# Patient Record
Sex: Female | Born: 2000
Health system: Southern US, Community
[De-identification: ages and names within clinical notes are randomized; demographics above are authoritative.]

## PROBLEM LIST (undated history)

## (undated) ENCOUNTER — Emergency Department (HOSPITAL_BASED_OUTPATIENT_CLINIC_OR_DEPARTMENT_OTHER): Admission: EM | Payer: Medicaid Other

## (undated) DIAGNOSIS — T7840XA Allergy, unspecified, initial encounter: Secondary | ICD-10-CM

## (undated) DIAGNOSIS — J45909 Unspecified asthma, uncomplicated: Secondary | ICD-10-CM

## (undated) DIAGNOSIS — J302 Other seasonal allergic rhinitis: Secondary | ICD-10-CM

## (undated) DIAGNOSIS — K219 Gastro-esophageal reflux disease without esophagitis: Secondary | ICD-10-CM

## (undated) DIAGNOSIS — L309 Dermatitis, unspecified: Secondary | ICD-10-CM

## (undated) DIAGNOSIS — E669 Obesity, unspecified: Secondary | ICD-10-CM

## (undated) DIAGNOSIS — K8020 Calculus of gallbladder without cholecystitis without obstruction: Secondary | ICD-10-CM

## (undated) DIAGNOSIS — F419 Anxiety disorder, unspecified: Secondary | ICD-10-CM

## (undated) DIAGNOSIS — R519 Headache, unspecified: Secondary | ICD-10-CM

## (undated) DIAGNOSIS — R51 Headache: Secondary | ICD-10-CM

## (undated) DIAGNOSIS — F959 Tic disorder, unspecified: Secondary | ICD-10-CM

## (undated) HISTORY — DX: Anxiety disorder, unspecified: F41.9

## (undated) HISTORY — PX: BILATERAL CARPAL TUNNEL RELEASE: SHX6508

## (undated) HISTORY — DX: Gastro-esophageal reflux disease without esophagitis: K21.9

## (undated) HISTORY — PX: UPPER GASTROINTESTINAL ENDOSCOPY: SHX188

## (undated) HISTORY — DX: Tic disorder, unspecified: F95.9

## (undated) HISTORY — DX: Obesity, unspecified: E66.9

---
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
## 2022-01-02 ENCOUNTER — Other Ambulatory Visit (HOSPITAL_COMMUNITY): Payer: Self-pay

## 2022-01-06 DIAGNOSIS — F331 Major depressive disorder, recurrent, moderate: Secondary | ICD-10-CM | POA: Diagnosis not present

## 2022-01-12 ENCOUNTER — Other Ambulatory Visit (HOSPITAL_COMMUNITY): Payer: Self-pay

## 2022-01-20 DIAGNOSIS — F331 Major depressive disorder, recurrent, moderate: Secondary | ICD-10-CM | POA: Diagnosis not present

## 2022-01-26 ENCOUNTER — Other Ambulatory Visit: Payer: Self-pay | Admitting: Family Medicine

## 2022-01-26 ENCOUNTER — Other Ambulatory Visit (HOSPITAL_COMMUNITY): Payer: Self-pay

## 2022-01-26 DIAGNOSIS — J4540 Moderate persistent asthma, uncomplicated: Secondary | ICD-10-CM

## 2022-01-26 DIAGNOSIS — J4530 Mild persistent asthma, uncomplicated: Secondary | ICD-10-CM

## 2022-01-26 MED ORDER — ALBUTEROL SULFATE HFA 108 (90 BASE) MCG/ACT IN AERS
2.0000 | INHALATION_SPRAY | Freq: Four times a day (QID) | RESPIRATORY_TRACT | 2 refills | Status: DC | PRN
  Filled 2022-01-26: qty 18, 25d supply, fill #0
  Filled 2022-06-06: qty 18, 25d supply, fill #1
  Filled 2022-07-25: qty 18, 25d supply, fill #2

## 2022-01-27 DIAGNOSIS — F331 Major depressive disorder, recurrent, moderate: Secondary | ICD-10-CM | POA: Diagnosis not present

## 2022-01-30 ENCOUNTER — Ambulatory Visit (INDEPENDENT_AMBULATORY_CARE_PROVIDER_SITE_OTHER): Payer: Medicaid Other | Admitting: Psychiatry

## 2022-01-30 ENCOUNTER — Other Ambulatory Visit (HOSPITAL_COMMUNITY): Payer: Self-pay

## 2022-01-30 ENCOUNTER — Encounter (HOSPITAL_COMMUNITY): Payer: Self-pay | Admitting: Psychiatry

## 2022-01-30 ENCOUNTER — Other Ambulatory Visit: Payer: Self-pay

## 2022-01-30 DIAGNOSIS — F315 Bipolar disorder, current episode depressed, severe, with psychotic features: Secondary | ICD-10-CM | POA: Diagnosis not present

## 2022-01-30 DIAGNOSIS — F411 Generalized anxiety disorder: Secondary | ICD-10-CM | POA: Diagnosis not present

## 2022-01-30 MED ORDER — TRAZODONE HCL 50 MG PO TABS
50.0000 mg | ORAL_TABLET | Freq: Every day | ORAL | 3 refills | Status: DC
  Filled 2022-01-30: qty 30, 30d supply, fill #0
  Filled 2022-04-17: qty 30, 30d supply, fill #1
  Filled 2022-05-25 – 2022-05-26 (×2): qty 30, 30d supply, fill #2

## 2022-01-30 MED ORDER — ESCITALOPRAM OXALATE 20 MG PO TABS
20.0000 mg | ORAL_TABLET | Freq: Every day | ORAL | 3 refills | Status: DC
  Filled 2022-01-30: qty 30, 30d supply, fill #0
  Filled 2022-02-27: qty 30, 30d supply, fill #1
  Filled 2022-04-10: qty 30, 30d supply, fill #2

## 2022-01-30 MED ORDER — HYDROXYZINE HCL 10 MG PO TABS
10.0000 mg | ORAL_TABLET | Freq: Three times a day (TID) | ORAL | 3 refills | Status: DC | PRN
  Filled 2022-01-30: qty 90, 30d supply, fill #0

## 2022-01-30 MED ORDER — LYBALVI 10-10 MG PO TABS
10.0000 mg | ORAL_TABLET | Freq: Every evening | ORAL | 3 refills | Status: DC
  Filled 2022-01-30: qty 30, 30d supply, fill #0
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
## 2022-02-07 ENCOUNTER — Other Ambulatory Visit (HOSPITAL_COMMUNITY): Payer: Self-pay

## 2022-02-11 DIAGNOSIS — R059 Cough, unspecified: Secondary | ICD-10-CM | POA: Diagnosis not present

## 2022-02-11 DIAGNOSIS — H6502 Acute serous otitis media, left ear: Secondary | ICD-10-CM | POA: Diagnosis not present

## 2022-02-11 DIAGNOSIS — R519 Headache, unspecified: Secondary | ICD-10-CM | POA: Diagnosis not present

## 2022-02-11 DIAGNOSIS — Z20822 Contact with and (suspected) exposure to covid-19: Secondary | ICD-10-CM | POA: Diagnosis not present

## 2022-02-11 DIAGNOSIS — R52 Pain, unspecified: Secondary | ICD-10-CM | POA: Diagnosis not present

## 2022-02-17 DIAGNOSIS — F331 Major depressive disorder, recurrent, moderate: Secondary | ICD-10-CM | POA: Diagnosis not present

## 2022-02-27 ENCOUNTER — Other Ambulatory Visit (HOSPITAL_COMMUNITY): Payer: Self-pay

## 2022-02-28 ENCOUNTER — Other Ambulatory Visit (HOSPITAL_COMMUNITY): Payer: Self-pay

## 2022-03-01 ENCOUNTER — Other Ambulatory Visit (HOSPITAL_COMMUNITY): Payer: Self-pay

## 2022-03-02 ENCOUNTER — Telehealth: Payer: Self-pay

## 2022-03-02 ENCOUNTER — Other Ambulatory Visit (HOSPITAL_COMMUNITY): Payer: Self-pay

## 2022-03-02 MED ORDER — TRIAMCINOLONE ACETONIDE 0.1 % EX CREA
1.0000 "application " | TOPICAL_CREAM | Freq: Two times a day (BID) | CUTANEOUS | 0 refills | Status: DC
  Filled 2022-03-02: qty 454, 28d supply, fill #0

## 2022-03-02 NOTE — Addendum Note (Signed)
Addended by: Renard Hamper on: 03/02/2022 12:58 PM ? ? Modules accepted: Orders ? ?

## 2022-03-02 NOTE — Telephone Encounter (Signed)
Received 2nd request for triamcinolone cream. Please send Rx if appropriate to [facility]. Sunday Spillers, CMA ? ?

## 2022-03-03 DIAGNOSIS — F331 Major depressive disorder, recurrent, moderate: Secondary | ICD-10-CM | POA: Diagnosis not present

## 2022-03-17 DIAGNOSIS — F331 Major depressive disorder, recurrent, moderate: Secondary | ICD-10-CM | POA: Diagnosis not present

## 2022-03-22 NOTE — Progress Notes (Signed)
? ? ?03/23/2022 ?MLISSA TAMAYO ?[id number] ?June 07, 2001 ? ? ?ASSESSMENT AND PLAN:  ? ?Nausea and vomiting, unspecified vomiting type ?Will refill Zofran, give Phenergan as needed. ?Would not do Reglan in this patient with Tourette's and movement disorder. ?High probability this is from Ozempic start which correlates with worsening symptoms, discussed with PCP about stopping or decreasing this dose.Marland Kitchen ?Can increase PPI to twice daily. ?Get x-ray to evaluate for possible constipation contributing, may need to start taking Linzess more frequently. ?Need to follow-up with psychologist/neurologist as in the office its more gagging without vomiting and this can potentially be picked up as a future check in the future so for preventative we will follow-up with neurology. ?-     Comprehensive metabolic panel; Future ?-     CBC with Differential/Platelet; Future ?-     TSH; Future ?-     Lipase; Future ?-     H. pylori antibody, IgG; Future ?-     DG Abd 1 View; Future ?-     C-reactive protein; Future ?-     promethazine (PHENERGAN) 25 MG tablet; Take 1 tablet (25 mg total) by mouth every 6 (six) hours as needed for nausea or vomiting. ?-     ondansetron (ZOFRAN) 8 MG tablet; Take 0.5-1 tablets (4-8 mg total) by mouth every 6 (six) hours as needed for nausea & vomiting ? ?Abdominal bloating ?Severe obesity (BMI >= 40) (HCC) ?Movement disorder ? ? ?History of Present Illness:  ?21 y.o. female  with a past medical history of  obesity, anxiety, depression, asthma, neurological tics 11/2020, vitamin D deficiency, IBS symptoms and GERD. S/P  laparoscopic cholecystectomy for symptomatic gallstones on 08/08/17 and others listed below, known to Dr. Marina Goodell returns to clinic today for evaluation of nausea. ? ?Last seen in the clinic by NP Riley Kill on 09/22/2021. ?Patient prior had discussed reflux with Dr. Marina Goodell, at that visit patient was having compulsive eating with weight gain.   ?Thought to be potentially related to Abilify  versus Tourette syndrome.  Referred to [place] [facility]. ?Continue Linzess 72 daily as needed for constipation once a month but has been daily BM, and Protonix and famotidine for her reflux. ?Has been on ozempic since Nov/Dec on 0.5 q weekly.  ?She states GERD is controlled with PPI.  ?States she has had nausea and vomiting daily for the last month, can keep 1 meal a day.  ?She does get full very quickly, some AB bloating after eating.  ? ?Mom is here with 9 kids, this is her youngest.  ?She is on ibuprofen at least once a week. She does get full very quickly.  ?No ETOH, no smoking.  ?She was 268 in Dec and 249 at this time.  ?She has some upper AB pain, lower AB pain. ?No blood in stool. ? ? ?Current Medications:  ? ?Current Outpatient Medications (Endocrine & Metabolic):  ?  etonogestrel (NEXPLANON) 68 MG IMPL implant, 1 each (68 mg total) by Subdermal route once. ?  metFORMIN (GLUCOPHAGE) 500 MG tablet, Take 1 tablet (500 mg total) by mouth 2 (two) times daily with a meal. ?  Semaglutide,0.25 or 0.5MG /DOS, 2 MG/1.5ML SOPN, Inject 0.25 mg into the skin once a week for 28 days, THEN 0.5 mg once a week ? ? ?Current Outpatient Medications (Respiratory):  ?  albuterol (VENTOLIN HFA) 108 (90 Base) MCG/ACT inhaler, Inhale 2 puffs into the lungs every 6 (six) hours as needed for wheezing or shortness of breath. ?  FLOVENT HFA 110 MCG/ACT inhaler, Inhale 2 puffs into the lungs 2 (two) times daily. ?  promethazine (PHENERGAN) 25 MG tablet, Take 1 tablet (25 mg total) by mouth every 6 (six) hours as needed for nausea or vomiting. ? ? ? ?Current Outpatient Medications (Other):  ?  dicyclomine (BENTYL) 10 MG capsule, Take 1 capsule (10 mg total) by mouth 2 (two) times daily as needed for spasms. ?  escitalopram (LEXAPRO) 20 MG tablet, Take 1 tablet (20 mg total) by mouth daily. ?  famotidine (PEPCID) 20 MG tablet, Take 1 tablet (20 mg total) by mouth at bedtime. ?  hydrOXYzine (ATARAX) 10 MG tablet,  Take 1 tablet (10 mg total) by mouth 3 (three) times daily as needed. ?  OLANZapine-Samidorphan (LYBALVI) 10-10 MG TABS, Take 1 tablet by mouth at bedtime. ?  ondansetron (ZOFRAN) 8 MG tablet, Take 0.5-1 tablets (4-8 mg total) by mouth every 6 (six) hours as needed for nausea & vomiting ?  pantoprazole (PROTONIX) 20 MG tablet, Take 1 tablet (20 mg total) by mouth daily. ?  Spacer/Aero Chamber Mouthpiece MISC, 1 Device by Does not apply route as needed. ?  traZODone (DESYREL) 50 MG tablet, Take 1 tablet (50 mg total) by mouth at bedtime. ?  triamcinolone cream in Minerin Creme, Apply 1 application topically 2 (two) times daily. Do not use for more than 7 consecutive days ? ?Surgical History:  ?She  has a past surgical history that includes Cholecystectomy (N/A, 08/08/2017); Bilateral carpal tunnel release; and Upper gastrointestinal endoscopy. ?Family History:  ?Her family history includes Arthritis in her maternal grandmother and mother; Asthma in her brother, mother, and sister; COPD in an other family member; Cancer in an other family member; Cervical cancer in her maternal grandmother; Depression in her brother, maternal aunt, and mother; Diabetes in her maternal aunt, maternal grandmother, maternal uncle, and mother; Heart attack in her father; Heart disease in her maternal grandmother, paternal grandmother, and another family member; Hyperlipidemia in her maternal grandmother; Hypertension in her maternal grandmother, mother, paternal grandmother, and another family member; Irritable bowel syndrome in her brother and mother; Kidney disease in her maternal grandmother and maternal uncle; Learning disabilities in her brother and sister; Mental illness in her maternal aunt; Obesity in her father and mother; Stroke in her maternal grandfather and sister. ?Social History:  ? reports that she has never smoked. She has been exposed to tobacco smoke. She has never used smokeless tobacco. She reports that she does not  drink alcohol and does not use drugs. ? ?Current Medications, Allergies, Past Medical History, Past Surgical History, Family History and Social History were reviewed in [employer] record. ? ?Physical Exam: ?BP 110/64   Pulse 70   Ht 5' (1.524 m)   Wt 249 lb (112.9 kg)   LMP 02/28/2022   BMI 48.63 kg/m?  ?General:   Pleasant, well developed female in no acute distress ?Heart:  Regular rate and rhythm; no murmurs ?Pulm: Clear anteriorly; no wheezing ?Abdomen:  Soft, Obese AB, Active bowel sounds. mild tenderness in the epigastrium and in the lower abdomen. With guarding and Without rebound, No organomegaly appreciated. ?Extremities:  with  edema. ?Neurologic:  Alert and  oriented x4;  No focal deficits, tics throughout the exam ?Psych:  Cooperative. Normal mood and affect. ? ? ?Doree Albee, PA-C ?03/23/22 ?

## 2022-03-23 ENCOUNTER — Other Ambulatory Visit (INDEPENDENT_AMBULATORY_CARE_PROVIDER_SITE_OTHER): Payer: Medicaid Other

## 2022-03-23 ENCOUNTER — Ambulatory Visit (INDEPENDENT_AMBULATORY_CARE_PROVIDER_SITE_OTHER): Payer: Medicaid Other | Admitting: Physician Assistant

## 2022-03-23 ENCOUNTER — Ambulatory Visit (INDEPENDENT_AMBULATORY_CARE_PROVIDER_SITE_OTHER)
Admission: RE | Admit: 2022-03-23 | Discharge: 2022-03-23 | Disposition: A | Discharge status: Home / Self Care | Payer: Medicaid Other | Source: Ambulatory Visit | Attending: Physician Assistant | Admitting: Physician Assistant

## 2022-03-23 ENCOUNTER — Other Ambulatory Visit (HOSPITAL_COMMUNITY): Payer: Self-pay

## 2022-03-23 ENCOUNTER — Encounter: Payer: Self-pay | Admitting: Physician Assistant

## 2022-03-23 VITALS — BP 110/64 | HR 70 | Ht 60.0 in | Wt 249.0 lb

## 2022-03-23 DIAGNOSIS — R140 Abdominal distension (gaseous): Secondary | ICD-10-CM

## 2022-03-23 DIAGNOSIS — R109 Unspecified abdominal pain: Secondary | ICD-10-CM | POA: Diagnosis not present

## 2022-03-23 DIAGNOSIS — G259 Extrapyramidal and movement disorder, unspecified: Secondary | ICD-10-CM | POA: Diagnosis not present

## 2022-03-23 DIAGNOSIS — R112 Nausea with vomiting, unspecified: Secondary | ICD-10-CM

## 2022-03-23 LAB — COMPREHENSIVE METABOLIC PANEL
ALT: 27 U/L (ref 0–35)
AST: 19 U/L (ref 0–37)
Albumin: 3.8 g/dL (ref 3.5–5.2)
Alkaline Phosphatase: 60 U/L (ref 47–119)
BUN: 10 mg/dL (ref 6–23)
CO2: 25 mEq/L (ref 19–32)
Calcium: 9.5 mg/dL (ref 8.4–10.5)
Chloride: 105 mEq/L (ref 96–112)
Creatinine, Ser: 0.85 mg/dL (ref 0.40–1.20)
GFR: 99.5 mL/min (ref >60.00)
Glucose, Bld: 79 mg/dL (ref 70–99)
Potassium: 3.8 mEq/L (ref 3.5–5.1)
Sodium: 137 mEq/L (ref 135–145)
Total Bilirubin: 0.3 mg/dL (ref 0.2–1.2)
Total Protein: 6.7 g/dL (ref 6.0–8.3)

## 2022-03-23 LAB — CBC WITH DIFFERENTIAL/PLATELET
Basophils Absolute: 0 10*3/uL (ref 0.0–0.1)
Basophils Relative: 0.8 % (ref 0.0–3.0)
Eosinophils Absolute: 0.2 10*3/uL (ref 0.0–0.7)
Eosinophils Relative: 2.7 % (ref 0.0–5.0)
HCT: 39.2 % (ref 36.0–49.0)
Hemoglobin: 13.1 g/dL (ref 12.0–16.0)
Lymphocytes Relative: 37.6 % (ref 24.0–48.0)
Lymphs Abs: 2.2 10*3/uL (ref 0.7–4.0)
MCHC: 33.4 g/dL (ref 31.0–37.0)
MCV: 85.4 fl (ref 78.0–98.0)
Monocytes Absolute: 0.5 10*3/uL (ref 0.1–1.0)
Monocytes Relative: 9 % (ref 3.0–12.0)
Neutro Abs: 2.9 10*3/uL (ref 1.4–7.7)
Neutrophils Relative %: 49.9 % (ref 43.0–71.0)
Platelets: 341 10*3/uL (ref 150.0–575.0)
RBC: 4.58 Mil/uL (ref 3.80–5.70)
RDW: 14.4 % (ref 11.4–15.5)
WBC: 5.8 10*3/uL (ref 4.5–13.5)

## 2022-03-23 LAB — H. PYLORI ANTIBODY, IGG: H Pylori IgG: NEGATIVE

## 2022-03-23 LAB — TSH: TSH: 1.02 u[IU]/mL (ref 0.40–5.00)

## 2022-03-23 LAB — C-REACTIVE PROTEIN: CRP: 1.5 mg/dL (ref 0.5–20.0)

## 2022-03-23 LAB — LIPASE: Lipase: 34 U/L (ref 11.0–59.0)

## 2022-03-23 MED ORDER — PROMETHAZINE HCL 25 MG PO TABS
25.0000 mg | ORAL_TABLET | Freq: Four times a day (QID) | ORAL | 0 refills | Status: DC | PRN
  Filled 2022-03-23: qty 30, 8d supply, fill #0

## 2022-03-23 MED ORDER — ONDANSETRON HCL 8 MG PO TABS
4.0000 mg | ORAL_TABLET | Freq: Four times a day (QID) | ORAL | 1 refills | Status: DC | PRN
  Filled 2022-03-23: qty 60, 15d supply, fill #0

## 2022-03-23 NOTE — Progress Notes (Signed)
Assessment and plans noted ?

## 2022-03-23 NOTE — Patient Instructions (Addendum)
If you are age 19 or older, your body mass index should be between 23-30. Your Body mass index is 48.63 kg/m?Marland Kitchen If this is out of the aforementioned range listed, please consider follow up with your Primary Care Provider. ? ?If you are age 49 or younger, your body mass index should be between 19-25. Your Body mass index is 48.63 kg/m?Marland Kitchen If this is out of the aformentioned range listed, please consider follow up with your Primary Care Provider.  ? ?________________________________________________________ ? ?The [facility] providers would like to encourage you to use [facility] to communicate with providers for non-urgent requests or questions.  Due to long hold times on the telephone, sending your provider a message by [facility] may be a faster and more efficient way to get a response.  Please allow 48 business hours for a response.  Please remember that this is for non-urgent requests.  ?_______________________________________________________ ? ?Your provider has requested that you go to the basement level for lab work before leaving today. Press "B" on the elevator. The lab is located at the first door on the left as you exit the elevator.  Also go by XRAY which is to the right off the elevator ? ? ?Try the phenergan at night, can try 1/2 during the day if you take it.  ?Can do zofran 3 x a day before food.  ? ?Consider stopping the ozempic ? ?Gastroparesis ?Please do small frequent meals like 4-6 meals a day.  ?Eat and drink liquids at separate times.  ?Avoid high fiber foods, cook your vegetables, avoid high fat food.  ?Suggest spreading protein throughout the day (greek yogurt, glucerna, soft meat, milk, eggs) ?Choose soft foods that you can mash with a fork ?When you are more symptomatic, change to pureed foods foods and liquids.  ?Consider reading "Living well with Gastroparesis" by Lambert Keto ?Gastroparesis is a condition in which food takes longer than normal to empty from the stomach. This condition is  also known as delayed gastric emptying. It is usually a long-term (chronic) condition. ?There is no cure, but there are treatments and things that you can do at home to help relieve symptoms. Treating the underlying condition that causes gastroparesis can also help relieve symptoms ?What are the causes? ?In many cases, the cause of this condition is not known. Possible causes include: ?A hormone (endocrine) disorder, such as hypothyroidism or diabetes. ?A nervous system disease, such as Parkinson's disease or multiple sclerosis. ?Cancer, infection, or surgery that affects the stomach or vagus nerve. The vagus nerve runs from your chest, through your neck, and to the lower part of your brain. ?A connective tissue disorder, such as scleroderma. ?Certain medicines. ?What increases the risk? ?You are more likely to develop this condition if: ?You have certain disorders or diseases. These may include: ?An endocrine disorder. ?An eating disorder. ?Amyloidosis. ?Scleroderma. ?Parkinson's disease. ?Multiple sclerosis. ?Cancer or infection of the stomach or the vagus nerve. ?You have had surgery on your stomach or vagus nerve. ?You take certain medicines. ?You are female. ?What are the signs or symptoms? ?Symptoms of this condition include: ?Feeling full after eating very little or a loss of appetite. ?Nausea, vomiting, or heartburn. ?Bloating of your abdomen. ?Inconsistent blood sugar (glucose) levels on blood tests. ?Unexplained weight loss. ?Acid from the stomach coming up into the esophagus (gastroesophageal reflux). ?Sudden tightening (spasm) of the stomach, which can be painful. ?Symptoms may come and go. Some people may not notice any symptoms. ?How is this diagnosed? ?This condition is  diagnosed with tests, such as: ?Tests that check how long it takes food to move through the stomach and intestines. These tests include: ?Upper gastrointestinal (GI) series. For this test, you drink a liquid that shows up well on  X-rays, and then X-rays are taken of your intestines. ?Gastric emptying scintigraphy. For this test, you eat food that contains a small amount of radioactive material, and then scans are taken. ?Wireless capsule GI monitoring system. For this test, you swallow a pill (capsule) that records information about how foods and fluid move through your stomach. ?Gastric manometry. For this test, a tube is passed down your throat and into your stomach to measure electrical and muscular activity. ?Endoscopy. For this test, a long, thin tube with a camera and light on the end is passed down your throat and into your stomach to check for problems in your stomach lining. ?Ultrasound. This test uses sound waves to create images of the inside of your body. This can help rule out gallbladder disease or pancreatitis as a cause of your symptoms. ?How is this treated? ?There is no cure for this condition, but treatment and home care may relieve symptoms. Treatment may include: ?Treating the underlying cause. ?Managing your symptoms by making changes to your diet and exercise habits. ?Taking medicines to control nausea and vomiting and to stimulate stomach muscles. ?Getting food through a feeding tube in the hospital. This may be done in severe cases. ?Having surgery to insert a device called a gastric electrical stimulator into your body. This device helps improve stomach emptying and control nausea and vomiting. ?Follow these instructions at home: ?Take over-the-counter and prescription medicines only as told by your health care provider. ?Follow instructions from your health care provider about eating or drinking restrictions. Your health care provider may recommend that you: ?Eat smaller meals more often. ?Eat low-fat foods. ?Eat low-fiber forms of high-fiber foods. For example, eat cooked vegetables instead of raw vegetables. ?Have only liquid foods instead of solid foods. Liquid foods are easier to digest. ?Drink enough fluid to  keep your urine pale yellow. ?Exercise as often as told by your health care provider. ?Keep all follow-up visits. This is important. ?Contact a health care provider if you: ?Notice that your symptoms do not improve with treatment. ?Have new symptoms. ?Get help right away if you: ?Have severe pain in your abdomen that does not improve with treatment. ?Have nausea that is severe or does not go away. ?Vomit every time you drink fluids. ?Summary ?Gastroparesis is a long-term (chronic) condition in which food takes longer than normal to empty from the stomach. ?Symptoms include nausea, vomiting, heartburn, bloating of your abdomen, and loss of appetite. ?Eating smaller portions, low-fat foods, and low-fiber forms of high-fiber foods may help you manage your symptoms. ?Get help right away if you have severe pain in your abdomen. ?This information is not intended to replace advice given to you by your health care provider. Make sure you discuss any questions you have with your health care provider. ?Document Revised: 04/12/2020 Document Reviewed: 04/12/2020 ?Elsevier Patient Education ? [place]. ? ? ?

## 2022-03-28 ENCOUNTER — Other Ambulatory Visit (HOSPITAL_COMMUNITY): Payer: Self-pay

## 2022-03-28 ENCOUNTER — Other Ambulatory Visit: Payer: Self-pay

## 2022-03-28 MED ORDER — LINACLOTIDE 72 MCG PO CAPS
72.0000 ug | ORAL_CAPSULE | Freq: Every day | ORAL | 3 refills | Status: DC
  Filled 2022-03-28 – 2022-05-03 (×2): qty 30, 30d supply, fill #0
  Filled 2022-05-25 – 2022-06-19 (×3): qty 30, 30d supply, fill #1
  Filled 2022-09-04: qty 30, 30d supply, fill #2
  Filled 2022-10-24: qty 30, 30d supply, fill #3

## 2022-04-03 ENCOUNTER — Encounter (HOSPITAL_COMMUNITY): Payer: Self-pay

## 2022-04-03 ENCOUNTER — Emergency Department (HOSPITAL_COMMUNITY)
Admission: EM | Admit: 2022-04-03 | Discharge: 2022-04-03 | Disposition: A | Discharge status: Home / Self Care | Payer: Medicaid Other | Attending: Emergency Medicine | Admitting: Emergency Medicine

## 2022-04-03 ENCOUNTER — Emergency Department (HOSPITAL_COMMUNITY): Payer: Medicaid Other

## 2022-04-03 ENCOUNTER — Other Ambulatory Visit: Payer: Self-pay

## 2022-04-03 ENCOUNTER — Telehealth: Payer: Self-pay

## 2022-04-03 DIAGNOSIS — R059 Cough, unspecified: Secondary | ICD-10-CM | POA: Insufficient documentation

## 2022-04-03 DIAGNOSIS — R1012 Left upper quadrant pain: Secondary | ICD-10-CM | POA: Diagnosis not present

## 2022-04-03 DIAGNOSIS — R1010 Upper abdominal pain, unspecified: Secondary | ICD-10-CM | POA: Diagnosis not present

## 2022-04-03 DIAGNOSIS — Z5321 Procedure and treatment not carried out due to patient leaving prior to being seen by health care provider: Secondary | ICD-10-CM | POA: Diagnosis not present

## 2022-04-03 DIAGNOSIS — U071 COVID-19: Secondary | ICD-10-CM | POA: Diagnosis not present

## 2022-04-03 LAB — CBC WITH DIFFERENTIAL/PLATELET
Abs Immature Granulocytes: 0.01 10*3/uL (ref 0.00–0.07)
Basophils Absolute: 0 10*3/uL (ref 0.0–0.1)
Basophils Relative: 1 %
Eosinophils Absolute: 0.3 10*3/uL (ref 0.0–0.5)
Eosinophils Relative: 7 %
HCT: 41.7 % (ref 36.0–46.0)
Hemoglobin: 13.8 g/dL (ref 12.0–15.0)
Immature Granulocytes: 0 %
Lymphocytes Relative: 25 %
Lymphs Abs: 1.1 10*3/uL (ref 0.7–4.0)
MCH: 28.6 pg (ref 26.0–34.0)
MCHC: 33.1 g/dL (ref 30.0–36.0)
MCV: 86.3 fL (ref 80.0–100.0)
Monocytes Absolute: 0.6 10*3/uL (ref 0.1–1.0)
Monocytes Relative: 14 %
Neutro Abs: 2.3 10*3/uL (ref 1.7–7.7)
Neutrophils Relative %: 53 %
Platelets: 380 10*3/uL (ref 150–400)
RBC: 4.83 MIL/uL (ref 3.87–5.11)
RDW: 14.1 % (ref 11.5–15.5)
WBC: 4.3 10*3/uL (ref 4.0–10.5)
nRBC: 0 % (ref 0.0–0.2)

## 2022-04-03 LAB — BASIC METABOLIC PANEL
Anion gap: 4 — ABNORMAL LOW (ref 5–15)
BUN: 9 mg/dL (ref 6–20)
CO2: 26 mmol/L (ref 22–32)
Calcium: 9.3 mg/dL (ref 8.9–10.3)
Chloride: 108 mmol/L (ref 98–111)
Creatinine, Ser: 0.71 mg/dL (ref 0.44–1.00)
GFR, Estimated: >60 mL/min (ref >60)
Glucose, Bld: 85 mg/dL (ref 70–99)
Potassium: 3.7 mmol/L (ref 3.5–5.1)
Sodium: 138 mmol/L (ref 135–145)

## 2022-04-03 NOTE — ED Triage Notes (Signed)
Patient tested positive for Covid yesterday. Cough has gotten worse and she is a known asthmatic. Feeling more tired and weaker today. ?

## 2022-04-03 NOTE — Telephone Encounter (Signed)
[place] Scientist, research (physical sciences) indicates possible interactions with hydroxyzine and trazodone. Will call patient during business hours Tuesday to discuss risks and benefits to starting Paxlovid.  ? ?Fayette Pho, MD ? ?

## 2022-04-03 NOTE — Telephone Encounter (Signed)
Patient calls nurse line reporting positive covid test.  ? ?Patient reports symptom onset was 4/14 with cough, congestion, runny nose and body aches. Patient reports positive test yesterday.  ? ?Patient does have a hx of asthma and reports "mild" SOB. Patient is speaking in full sentences.  ? ?Patient has been using Robitussin with minimal relief.  ? ?Patients mother wanted to know if she would be a good candidate for antiviral therapy with hx of asthma.  ? ?Discussed red flags with patient. Encouraged fluids and warm teas with honey.  ? ?Will forward to PCP.  ?

## 2022-04-03 NOTE — ED Provider Triage Note (Signed)
Emergency Medicine Provider Triage Evaluation Note ? ?Sherri [name] , a 21 y.o. female  was evaluated in triage.  Pt complains of worsening cough in the setting of COVID-positive test.  Patient states that her symptoms started approximately 3 days ago and she tested positive yesterday.  She reports that her cough is getting worse and mother was concerned that her heart rate was pretty high at home.  No fevers.  Patient also is complaining of severe abdominal pain, worse on the left upper quadrant radiating down the back and along with some chest pain secondary to coughing.  Denies vomiting and diarrhea. ? ?Review of Systems  ?Positive: As above ?Negative: As above ? ?Physical Exam  ?BP 136/63 (BP Location: Right Arm)   Pulse (!) 112   Temp 98.7 ?F (37.1 ?C) (Oral)   Resp (!) 23   SpO2 100%  ?Gen:   Awake, no distress , ill appearing ?Resp:  Normal effort, lungs CTA bilaterally, dry hacking cough heard in room ?MSK:   Moves extremities without difficulty  ?Other:  Mild tenderness to palpation in the left upper quadrant ? ?Medical Decision Making  ?Medically screening exam initiated at 7:48 PM.  Appropriate orders placed.  Sherri [name] was informed that the remainder of the evaluation will be completed by another provider, this initial triage assessment does not replace that evaluation, and the importance of remaining in the ED until their evaluation is complete. ? ? ?  ?Tonye Pearson, [place] ?04/03/22 1950 ? ?

## 2022-04-04 ENCOUNTER — Encounter: Payer: Self-pay | Admitting: Family Medicine

## 2022-04-04 DIAGNOSIS — Z0271 Encounter for disability determination: Secondary | ICD-10-CM

## 2022-04-04 MED ORDER — BENZONATATE 200 MG PO CAPS
200.0000 mg | ORAL_CAPSULE | Freq: Two times a day (BID) | ORAL | 0 refills | Status: DC | PRN
  Filled 2022-04-04: qty 20, 10d supply, fill #0

## 2022-04-04 MED ORDER — PAXLOVID (300/100) 20 X 150 MG & 10 X 100MG PO TBPK
3.0000 | ORAL_TABLET | Freq: Two times a day (BID) | ORAL | 0 refills | Status: DC
  Filled 2022-04-04: qty 30, 5d supply, fill #0

## 2022-04-04 NOTE — Addendum Note (Signed)
Addended by: Renard Hamper on: 04/04/2022 09:17 PM ? ? Modules accepted: Orders ? ?

## 2022-04-04 NOTE — Telephone Encounter (Signed)
Called patient and discussed paxlovid. Instructed that she cannot take hydroxyzine or trazodone with paxlovid. Also sent in script for tessalon perls for cough. Discussed ED CXR and lab results, all normal.  ? ?Patient amenable. Return precautions given.  ? ?Ezequiel Essex, MD ? ?

## 2022-04-04 NOTE — Telephone Encounter (Signed)
Patient calls nurse line checking the status of antivirals.  ? ?Patient reports she went to the ED yesterday, however the wait was too long and she left.  ? ?Will forward to PCP.  ?

## 2022-04-05 ENCOUNTER — Other Ambulatory Visit (HOSPITAL_COMMUNITY): Payer: Self-pay

## 2022-04-10 ENCOUNTER — Other Ambulatory Visit (HOSPITAL_COMMUNITY): Payer: Self-pay

## 2022-04-11 ENCOUNTER — Encounter: Payer: Self-pay | Admitting: Family Medicine

## 2022-04-11 ENCOUNTER — Ambulatory Visit (INDEPENDENT_AMBULATORY_CARE_PROVIDER_SITE_OTHER): Payer: Medicaid Other | Admitting: Family Medicine

## 2022-04-11 VITALS — BP 123/80 | HR 91 | Ht 60.0 in

## 2022-04-11 DIAGNOSIS — R531 Weakness: Secondary | ICD-10-CM | POA: Diagnosis not present

## 2022-04-11 DIAGNOSIS — F331 Major depressive disorder, recurrent, moderate: Secondary | ICD-10-CM

## 2022-04-11 DIAGNOSIS — R29898 Other symptoms and signs involving the musculoskeletal system: Secondary | ICD-10-CM

## 2022-04-11 NOTE — Progress Notes (Signed)
? ? ?  SUBJECTIVE:  ? ?CHIEF COMPLAINT / HPI:  ? ?Sherri [name] is a 21 yo who presents to discuss ongoing symptoms after testing positive for COVID. She presented to the ED on 4/17 after testing positive for COVID at home the day prior.  6 days ago was prescribed Paxlovid but and Tessalon Perles for her cough but did not complete Paxlovid course. Concerned about ongoing cough and elevated heart rate. Feels a chest pressure since getting COVID that is ongoing, not worsening or getting better. States laying down makes it feel better. Tender if she presses on the ara in the center of her chest. Endorses chronic sharp pains under left breast. States she is still having runny nose, headache intermittently. Coughing has improved  ? ?She states her legs feel very weak. When stands up feels like she cant bear weight. Legs aren't painful but ankles are starting to hurt. Has not noticed increased leg swelling. Has not noticed any redness.  ? ?Has therapist and psychiatrist. Has appointment on 4/27. Stopped taking medication. Denies SI/HI at this time.  ? ?PERTINENT  PMH / PSH:  ?Asthma, allergies, major depression ? ?OBJECTIVE:  ? ?BP 123/80   Pulse 91   Ht 5' (1.524 m)   LMP 03/30/2022   SpO2 99%   BMI 48.63 kg/m?   ? ?Physical exam ?General: sitting in wheelchair, NAD ?Cardiovascular: RRR, no murmurs ?Lungs: CTAB. Normal WOB ?Abdomen: soft, non-distended, non-tender ?Skin: warm, dry.Non erythematous. No edema ? ?ASSESSMENT/PLAN:  ? ?Major depression ?PHQ9 21. Positive #9 which she states is always the case. Hx of cutting herself but denies currently having a plan to hurt herself. Stopped taking medication.Mom is with her and has also been helping her at home. Has therapist and psychiatrist. Has appointment on 4/27. Urged her to attend her upcoming appointment and discussed options such as [place] if needed. Mom  ? ?Lower extremity weakness ?Patient presents with numerous symptoms after testing positive for COVID on 4/17  including leg weakness, inability to ambulate up an down stairs, chest pain that is reproducible on palpation, and upper respiratory symptoms. Patient unable to ambulate during exam due to weakness. Recent TSH, CMP, CBC normal. I suspect deconditioning is playing a big factor and I discussed not staying in the wheelchair all day and to try to ambulate. Discussed that physical therapy would be beneficial to help with that which she was agreeable to. Also suspect a psychosomatic component as she is dealing with major depression. She will follow up in 2 days with her psychiatrist and hopefully restart medication. Will check CK to assess for rhabdomyolysis as well as Vit B12 which could also cause this lower extremity weakness. Recommended follow up with PCP in 1-2 weeks and gave strict return precautions.  ?  ? ? ? ?Shary Key, DO ?[place]   ?

## 2022-04-11 NOTE — Patient Instructions (Addendum)
It was great seeing you today! ? ?Im sorry you arent feeling well! I am going to place an order for physical therapy to help increase strength and mobility. We are also checking a CK and Vitamin B12 to rule out other causes for your weakness though it is likely due to COVID ? ?Please check-out at the front desk before leaving the clinic. Schedule to see your PCP in 1-2 weeks for follow up, but if you need to be seen earlier than that for any new issues we're happy to fit you in, just give [place] a call! ? ?Feel free to call with any questions or concerns at any time, at [phone]. ?  ?Take care,  ?Dr. Shary Key ?[place]  ?

## 2022-04-12 LAB — VITAMIN B12: Vitamin B-12: 497 pg/mL (ref 232–1245)

## 2022-04-12 NOTE — Progress Notes (Deleted)
04/12/2022 LARRAINE LANGWORTHY [id number] 29-Mar-2001   ASSESSMENT AND PLAN:   Nausea and vomiting, unspecified vomiting type   Abdominal bloating Severe obesity (BMI >= 40) (HCC) Movement disorder   History of Present Illness:  21 y.o. female  with a past medical history of  obesity, anxiety, depression, asthma, neurological tics 11/2020, vitamin D deficiency, IBS symptoms and GERD.  S/P  laparoscopic cholecystectomy for symptomatic gallstones on 08/08/17 and others listed below, known to Dr. Henrene Pastor returns to clinic today for evaluation of nausea.  Last seen 04/06 for nausea.  Given Zofran and Phenergan. High probability thought possibly due to Ozempic which correlates with symptoms. Increase PPI to twice daily. Abdominal x-ray did show large stool burden, increase Linzess to daily. Encouraged to follow-up with neurology Since last visit patient did test positive for COVID-19 on 04/02/2022, presented 04/11/2022   Mom is here with 9 kids, this is her youngest.  She is on ibuprofen at least once a week. She does get full very quickly.  No ETOH, no smoking.  She was 268 in Dec and 249 at this time.  She has some upper AB pain, lower AB pain. No blood in stool.   Current Medications:   Current Outpatient Medications (Endocrine & Metabolic):    etonogestrel (NEXPLANON) 68 MG IMPL implant, 1 each (68 mg total) by Subdermal route once.   metFORMIN (GLUCOPHAGE) 500 MG tablet, Take 1 tablet (500 mg total) by mouth 2 (two) times daily with a meal.   Current Outpatient Medications (Respiratory):    albuterol (VENTOLIN HFA) 108 (90 Base) MCG/ACT inhaler, Inhale 2 puffs into the lungs every 6 (six) hours as needed for wheezing or shortness of breath.   benzonatate (TESSALON) 200 MG capsule, Take 1 capsule (200 mg total) by mouth 2 (two) times daily as needed for cough.   FLOVENT HFA 110 MCG/ACT inhaler, Inhale 2 puffs into the lungs 2 (two) times daily.   promethazine (PHENERGAN) 25  MG tablet, Take 1 tablet (25 mg total) by mouth every 6 (six) hours as needed for nausea or vomiting.    Current Outpatient Medications (Other):    dicyclomine (BENTYL) 10 MG capsule, Take 1 capsule (10 mg total) by mouth 2 (two) times daily as needed for spasms.   escitalopram (LEXAPRO) 20 MG tablet, Take 1 tablet (20 mg total) by mouth daily.   famotidine (PEPCID) 20 MG tablet, Take 1 tablet (20 mg total) by mouth at bedtime.   hydrOXYzine (ATARAX) 10 MG tablet, Take 1 tablet (10 mg total) by mouth 3 (three) times daily as needed.   linaclotide (LINZESS) 72 MCG capsule, Take 1 capsule (72 mcg total) by mouth daily before breakfast.   nirmatrelvir & ritonavir (PAXLOVID, 300/100,) 20 x 150 MG & 10 x 100MG  TBPK, Take 3 tablets by mouth 2 (two) times daily for 5 days   OLANZapine-Samidorphan (LYBALVI) 10-10 MG TABS, Take 1 tablet by mouth at bedtime.   ondansetron (ZOFRAN) 8 MG tablet, Take 0.5-1 tablets (4-8 mg total) by mouth every 6 (six) hours as needed for nausea & vomiting   pantoprazole (PROTONIX) 20 MG tablet, Take 1 tablet (20 mg total) by mouth daily.   Spacer/Aero Chamber Mouthpiece MISC, 1 Device by Does not apply route as needed.   traZODone (DESYREL) 50 MG tablet, Take 1 tablet (50 mg total) by mouth at bedtime.   triamcinolone cream in [place] Creme, Apply 1 application topically 2 (two) times daily. Do not use for more than 7 consecutive days  Surgical History:  She  has a past surgical history that includes Cholecystectomy (N/A, 08/08/2017); Bilateral carpal tunnel release; and Upper gastrointestinal endoscopy. Family History:  Her family history includes Arthritis in her maternal grandmother and mother; Asthma in her brother, mother, and sister; COPD in an other family member; Cancer in an other family member; Cervical cancer in her maternal grandmother; Depression in her brother, maternal aunt, and mother; Diabetes in her maternal aunt, maternal grandmother, maternal uncle, and  mother; Heart attack in her father; Heart disease in her maternal grandmother, paternal grandmother, and another family member; Hyperlipidemia in her maternal grandmother; Hypertension in her maternal grandmother, mother, paternal grandmother, and another family member; Irritable bowel syndrome in her brother and mother; Kidney disease in her maternal grandmother and maternal uncle; Learning disabilities in her brother and sister; Mental illness in her maternal aunt; Obesity in her father and mother; Stroke in her maternal grandfather and sister. Social History:   reports that she has never smoked. She has been exposed to tobacco smoke. She has never used smokeless tobacco. She reports that she does not drink alcohol and does not use drugs.  Current Medications, Allergies, Past Medical History, Past Surgical History, Family History and Social History were reviewed in [employer] record.  Physical Exam: LMP 03/30/2022  General:   Pleasant, well developed female in no acute distress Heart:  Regular rate and rhythm; no murmurs Pulm: Clear anteriorly; no wheezing Abdomen:  Soft, Obese AB, Active bowel sounds. mild tenderness in the epigastrium and in the lower abdomen. With guarding and Without rebound, No organomegaly appreciated. Extremities:  with  edema. Neurologic:  Alert and  oriented x4;  No focal deficits, tics throughout the exam Psych:  Cooperative. Normal mood and affect.   Vladimir Crofts, PA-C 04/12/22

## 2022-04-13 ENCOUNTER — Ambulatory Visit: Payer: Medicaid Other | Admitting: Physician Assistant

## 2022-04-13 ENCOUNTER — Other Ambulatory Visit (HOSPITAL_COMMUNITY): Payer: Self-pay

## 2022-04-13 ENCOUNTER — Telehealth (INDEPENDENT_AMBULATORY_CARE_PROVIDER_SITE_OTHER): Payer: Medicaid Other | Admitting: Psychiatry

## 2022-04-13 ENCOUNTER — Encounter (HOSPITAL_COMMUNITY): Payer: Medicaid Other | Admitting: Psychiatry

## 2022-04-13 DIAGNOSIS — M6281 Muscle weakness (generalized): Secondary | ICD-10-CM | POA: Insufficient documentation

## 2022-04-13 DIAGNOSIS — F315 Bipolar disorder, current episode depressed, severe, with psychotic features: Secondary | ICD-10-CM

## 2022-04-13 DIAGNOSIS — R29898 Other symptoms and signs involving the musculoskeletal system: Secondary | ICD-10-CM | POA: Insufficient documentation

## 2022-04-13 DIAGNOSIS — F411 Generalized anxiety disorder: Secondary | ICD-10-CM | POA: Diagnosis not present

## 2022-04-13 LAB — CK: Total CK: 87 U/L (ref 32–182)

## 2022-04-13 MED ORDER — MELATONIN 3 MG PO TABS
3.0000 mg | ORAL_TABLET | Freq: Every day | ORAL | 2 refills | Status: DC
  Filled 2022-04-13 – 2022-05-25 (×2): qty 30, 30d supply, fill #0

## 2022-04-13 NOTE — Progress Notes (Signed)
BH MD/PA/NP OP Progress Note ? ?04/13/2022 4:56 PM ?Sherri [name]  ?MRN:  [id number] ? ?Virtual Visit via Telephone Note ? ?I connected with Sherri [name] on 04/13/22 at  4:30 PM EDT by telephone and verified that I am speaking with the correct person using two identifiers. ? ?Location: ?Patient: Home ?Provider: Offsite ?  ?I discussed the limitations, risks, security and privacy concerns of performing an evaluation and management service by telephone and the availability of in person appointments. I also discussed with the patient that there may be a patient responsible charge related to this service. The patient expressed understanding and agreed to proceed. ? ? ? ?  ?I discussed the assessment and treatment plan with the patient. The patient was provided an opportunity to ask questions and all were answered. The patient agreed with the plan and demonstrated an understanding of the instructions. ?  ?The patient was advised to call back or seek an in-person evaluation if the symptoms worsen or if the condition fails to improve as anticipated. ? ?I provided 10 minutes of non-face-to-face time during this encounter. ? ? ?Franne Grip, NP  ? ?Chief Complaint: Medication management ? ?HPI: Sherri [name] is a 21 year old female presenting to [facility] behavioral health outpatient for follow-up psychiatric evaluation.  Patient has a psychiatric history of depression and insomnia and symptoms are managed with trazodone 50 mg at bedtime, Lybalvi 10-10 mg nightly, hydroxyzine 10 mg 3 times daily as needed for anxiety and Lexapro 20 mg daily.  Patient reports that she has not been taking the Lexapro due to side effect of not being able to feel physical pleasure.  Patient wishes to discontinue Lexapro today.  Lexapro discontinued.  Patient reports that her current medication regimen has been effective except for her having difficulty getting to sleep at night.  Patient reports that she previously took melatonin  along with her trazodone which was effective.  Patient open to restarting melatonin 3 mg today.  Medication benefits versus risk discussed.  Remaining medications noted with 3 refills remaining and available at the pharmacy.   ? ?Patient is alert and oriented x4, calm, pleasant and willing to engage.  Patient appears fairly groomed and reports that she has COVID-19.  She reports good mood and appetite.  Patient denies suicidal or homicidal ideations, paranoia, delusional thought, auditory or visual hallucinations. ? ? ?Visit Diagnosis:  ?  ICD-10-CM   ?1. Generalized anxiety disorder  F41.1   ?  ?2. Bipolar disorder, current episode depressed, severe, with psychotic features ([place])  F31.5   ?  ? ? ?Past Psychiatric History:  ? ? ? ?Past Medical History:  ?Past Medical History:  ?Diagnosis Date  ?  Chronic pain 02/02/2020  ? Abnormal vision screen 08/26/2015  ? Acanthosis nigricans 02/26/2020  ? Acid reflux   ? Allergy   ? Seasonal allergies  ? Anxiety   ? Phreesia 11/24/2020  ? Asthma   ? Atopic dermatitis 07/14/2013  ? Carpal tunnel syndrome, bilateral 11/21/2018  ? S/p bilat surgery, release  ? Chest pain 07/16/2019  ? Concussion 02/16/2021  ? Approx 2/15 d/t hit posterior skull against wall during tic.   ? Constipation 06/17/2013  ? Cough 01/29/2021  ? COVID-19 01/22/2020  ? Eczema   ? Elevated blood pressure reading 02/26/2020  ? Gallstones   ? GERD (gastroesophageal reflux disease)   ? Phreesia 11/24/2020  ? Headache   ? Obesity   ? Pain of both hip joints 11/10/2019  ? PCOS (polycystic ovarian  syndrome) 02/26/2020  ? Seasonal allergies   ? Tic   ?  ?Past Surgical History:  ?Procedure Laterality Date  ? BILATERAL CARPAL TUNNEL RELEASE    ? CHOLECYSTECTOMY N/A 08/08/2017  ? Procedure: LAPAROSCOPIC CHOLECYSTECTOMY;  Surgeon: Stanford Scotland, MD;  Location: [facility];  Service: General;  Laterality: N/A;  ? UPPER GASTROINTESTINAL ENDOSCOPY    ? ? ?Family Psychiatric History:  ? ?Family History:  ?Family History  ?Problem Relation Age  of Onset  ? Asthma Mother   ? Arthritis Mother   ? Depression Mother   ? Diabetes Mother   ? Hypertension Mother   ? Obesity Mother   ? Irritable bowel syndrome Mother   ? Asthma Sister   ? Learning disabilities Sister   ? Stroke Sister   ? Asthma Brother   ? Depression Brother   ? Learning disabilities Brother   ? Irritable bowel syndrome Brother   ? Depression Maternal Aunt   ? Diabetes Maternal Aunt   ? Mental illness Maternal Aunt   ? Diabetes Maternal Uncle   ? Kidney disease Maternal Uncle   ? Arthritis Maternal Grandmother   ? Diabetes Maternal Grandmother   ? Heart disease Maternal Grandmother   ? Kidney disease Maternal Grandmother   ? Hypertension Maternal Grandmother   ? Hyperlipidemia Maternal Grandmother   ? Cervical cancer Maternal Grandmother   ? Stroke Maternal Grandfather   ? Cancer Other   ? COPD Other   ? Heart disease Other   ? Hypertension Other   ? Obesity Father   ? Heart attack Father   ? Heart disease Paternal Grandmother   ? Hypertension Paternal Grandmother   ? Colon cancer Neg Hx   ? Esophageal cancer Neg Hx   ? Pancreatic cancer Neg Hx   ? Stomach cancer Neg Hx   ? ? ?Social History:  ?Social History  ? ?Socioeconomic History  ? Marital status: Single  ?  Spouse name: Not on file  ? Number of children: 0  ? Years of education: 60  ? Highest education level: GED or equivalent  ?Occupational History  ? Occupation: Control and instrumentation engineer to college  ? Occupation: Seeking disability  ?Tobacco Use  ? Smoking status: Never  ?  Passive exposure: Yes  ? Smokeless tobacco: Never  ? Tobacco comments:  ?  mom stopped smoking  ?Vaping Use  ? Vaping Use: Never used  ?Substance and Sexual Activity  ? Alcohol use: No  ? Drug use: No  ? Sexual activity: Never  ?Other Topics Concern  ? Not on file  ?Social History Narrative  ? Lives with Mom and some siblings, some of whom have children of their own, dropped out of [employer]. plans to start [employer] for GED.  ? Right-handed.  ? No daily use of caffeine.   ? ?Social  Determinants of Health  ? ?Financial Resource Strain: Not on file  ?Food Insecurity: Not on file  ?Transportation Needs: Not on file  ?Physical Activity: Not on file  ?Stress: Not on file  ?Social Connections: Not on file  ? ? ?Allergies:  ?Allergies  ?Allergen Reactions  ? Shellfish Allergy Hives and Itching  ?  Mouth itches, facial hives.   ? ? ?Metabolic Disorder Labs: ?Lab Results  ?Component Value Date  ? HGBA1C 5.3 12/07/2021  ? MPG 123 02/11/2020  ? MPG 126 07/18/2019  ? ?Lab Results  ?Component Value Date  ? PROLACTIN 12.2 07/18/2019  ? ?Lab Results  ?Component Value Date  ? CHOL  136 01/27/2021  ? TRIG 74 01/27/2021  ? HDL 42 01/27/2021  ? CHOLHDL 3.2 01/27/2021  ? VLDL 16 07/25/2013  ? [place] 79 01/27/2021  ? [place] 66 08/01/2018  ? ?Lab Results  ?Component Value Date  ? TSH 1.02 03/23/2022  ? TSH 0.60 04/18/2021  ? ? ?Therapeutic Level Labs: ?No results found for: LITHIUM ?No results found for: VALPROATE ?No components found for:  CBMZ ? ?Current Medications: ?Current Outpatient Medications  ?Medication Sig Dispense Refill  ? melatonin 3 MG TABS tablet Take 1 tablet (3 mg total) by mouth at bedtime. 30 tablet 2  ? albuterol (VENTOLIN HFA) 108 (90 Base) MCG/ACT inhaler Inhale 2 puffs into the lungs every 6 (six) hours as needed for wheezing or shortness of breath. 18 g 2  ? benzonatate (TESSALON) 200 MG capsule Take 1 capsule (200 mg total) by mouth 2 (two) times daily as needed for cough. 20 capsule 0  ? dicyclomine (BENTYL) 10 MG capsule Take 1 capsule (10 mg total) by mouth 2 (two) times daily as needed for spasms. 90 capsule 0  ? etonogestrel (NEXPLANON) 68 MG IMPL implant 1 each (68 mg total) by Subdermal route once. 1 each 0  ? famotidine (PEPCID) 20 MG tablet Take 1 tablet (20 mg total) by mouth at bedtime. 90 tablet 1  ? FLOVENT HFA 110 MCG/ACT inhaler Inhale 2 puffs into the lungs 2 (two) times daily. 12 g 11  ? hydrOXYzine (ATARAX) 10 MG tablet Take 1 tablet (10 mg total) by mouth 3 (three)  times daily as needed. 90 tablet 3  ? linaclotide (LINZESS) 72 MCG capsule Take 1 capsule (72 mcg total) by mouth daily before breakfast. 30 capsule 3  ? metFORMIN (GLUCOPHAGE) 500 MG tablet Take 1 tablet (500 mg t

## 2022-04-13 NOTE — Assessment & Plan Note (Signed)
>>  ASSESSMENT AND PLAN FOR MAJOR DEPRESSION WRITTEN ON 04/13/2022 10:52 PM BY PAIGE, VICTORIA J, DO  PHQ9 21. Positive #9 which she states is always the case. Hx of cutting herself but denies currently having a plan to hurt herself. Stopped taking medication.Mom is with her and has also been helping her at home. Has therapist and psychiatrist. Has appointment on 4/27. Urged her to attend her upcoming appointment and discussed options such as [facility] if needed. Mom

## 2022-04-13 NOTE — Assessment & Plan Note (Signed)
PHQ9 21. Positive #9 which she states is always the case. Hx of cutting herself but denies currently having a plan to hurt herself. Stopped taking medication.Mom is with her and has also been helping her at home. Has therapist and psychiatrist. Has appointment on 4/27. Urged her to attend her upcoming appointment and discussed options such as [facility] if needed. Mom  ?

## 2022-04-13 NOTE — Assessment & Plan Note (Signed)
Patient presents with numerous symptoms after testing positive for COVID on 4/17 including leg weakness, inability to ambulate up an down stairs, chest pain that is reproducible on palpation, and upper respiratory symptoms. Patient unable to ambulate during exam due to weakness. Recent TSH, CMP, CBC normal. I suspect deconditioning is playing a big factor and I discussed not staying in the wheelchair all day and to try to ambulate. Discussed that physical therapy would be beneficial to help with that which she was agreeable to. Also suspect a psychosomatic component as she is dealing with major depression. She will follow up in 2 days with her psychiatrist and hopefully restart medication. Will check CK to assess for rhabdomyolysis as well as Vit B12 which could also cause this lower extremity weakness. Recommended follow up with PCP in 1-2 weeks and gave strict return precautions.  ?

## 2022-04-14 ENCOUNTER — Ambulatory Visit: Payer: Medicaid Other | Attending: Family Medicine

## 2022-04-14 DIAGNOSIS — R293 Abnormal posture: Secondary | ICD-10-CM | POA: Insufficient documentation

## 2022-04-14 DIAGNOSIS — R531 Weakness: Secondary | ICD-10-CM | POA: Insufficient documentation

## 2022-04-14 DIAGNOSIS — M6281 Muscle weakness (generalized): Secondary | ICD-10-CM | POA: Insufficient documentation

## 2022-04-14 DIAGNOSIS — F331 Major depressive disorder, recurrent, moderate: Secondary | ICD-10-CM | POA: Diagnosis not present

## 2022-04-14 DIAGNOSIS — R262 Difficulty in walking, not elsewhere classified: Secondary | ICD-10-CM | POA: Insufficient documentation

## 2022-04-14 NOTE — Therapy (Signed)
?OUTPATIENT PHYSICAL THERAPY LOWER EXTREMITY EVALUATION ? ? ?Patient Name: Sherri [name] ?MRN: [id number] ?DOB:2001-01-29, 21 y.o., female ?Today's Date: 04/14/2022 ? ? ? ?Past Medical History:  ?Diagnosis Date  ?  Chronic pain 02/02/2020  ? Abnormal vision screen 08/26/2015  ? Acanthosis nigricans 02/26/2020  ? Acid reflux   ? Allergy   ? Seasonal allergies  ? Anxiety   ? Phreesia 11/24/2020  ? Asthma   ? Atopic dermatitis 07/14/2013  ? Carpal tunnel syndrome, bilateral 11/21/2018  ? S/p bilat surgery, release  ? Chest pain 07/16/2019  ? Concussion 02/16/2021  ? Approx 2/15 d/t hit posterior skull against wall during tic.   ? Constipation 06/17/2013  ? Cough 01/29/2021  ? COVID-19 01/22/2020  ? Eczema   ? Elevated blood pressure reading 02/26/2020  ? Gallstones   ? GERD (gastroesophageal reflux disease)   ? Phreesia 11/24/2020  ? Headache   ? Obesity   ? Pain of both hip joints 11/10/2019  ? PCOS (polycystic ovarian syndrome) 02/26/2020  ? Seasonal allergies   ? Tic   ? ?Past Surgical History:  ?Procedure Laterality Date  ? BILATERAL CARPAL TUNNEL RELEASE    ? CHOLECYSTECTOMY N/A 08/08/2017  ? Procedure: LAPAROSCOPIC CHOLECYSTECTOMY;  Surgeon: Stanford Scotland, MD;  Location: [facility];  Service: General;  Laterality: N/A;  ? UPPER GASTROINTESTINAL ENDOSCOPY    ? ?Patient Active Problem List  ? Diagnosis Date Noted  ? Lower extremity weakness 04/13/2022  ? Major depression 10/07/2021  ? Abnormal uterine bleeding (AUB) 05/12/2021  ? Movement disorder 04/14/2021  ? Macromastia 03/16/2021  ? Thoracic back pain 03/16/2021  ? Poor appetite 03/16/2021  ? Hand pain, not arthralgia 02/16/2021  ? Healthcare maintenance 01/29/2021  ? Tic disorder, unspecified 08/10/2020  ? Hirsutism 02/26/2020  ? Migraine without aura and without status migrainosus, not intractable 02/10/2020  ? Moderate persistent asthma 02/10/2020  ? Irritable bowel syndrome 02/10/2020  ? Chronic nonintractable headache 02/02/2020  ? Pre-diabetes 07/16/2019  ? Breast pain  07/16/2019  ? Sleeping difficulty 07/16/2019  ? Menorrhagia with irregular cycle 07/16/2019  ? Chronic low back pain without sciatica 04/26/2017  ? Gastroesophageal reflux  07/29/2015  ? Vitamin D deficiency 07/29/2013  ? Morbid obesity ([place]) 07/29/2013  ? Asthma, chronic 07/14/2013  ? Allergic rhinitis 07/14/2013  ? ? ?PCP: Dr. Ezequiel Essex, MD ? ?REFERRING PROVIDER: Dr. Dorcas Mcmurray, MD ? ?REFERRING DIAG: R53.1 (ICD-10-CM) - Weakness ? ?THERAPY DIAG:  ?No diagnosis found. ? ?ONSET DATE: 2 weeks ago ? ?SUBJECTIVE:  ? ?SUBJECTIVE STATEMENT: ?Pt reports having Covid onset 2 weeks ago. She tested positive for Covid on a Sunday and the next day, she felt really weak in her legs. "After awhile, she could not walk". She since feels weakness in her ankles and has fallen one time down the stairs. Her body is hurting, but she did not hurt anything. She has to go upstairs to her bedroom, since the only bathroom is upstairs.  ? ?PERTINENT HISTORY: ?Covid April 2023 ? ?PAIN:  ?Are you having pain? Yes: NPRS scale: 6/10 ?Pain location: whole body from fall ?Pain description: achy ?Aggravating factors: moving ?Relieving factors: resting ? ?PRECAUTIONS: None ? ?WEIGHT BEARING RESTRICTIONS No ? ?FALLS:  ?Has patient fallen in last 6 months? Yes. Number of falls 1 down the stairs ? ?LIVING ENVIRONMENT: ?Lives with: lives with their family ?Lives in: House/apartment ?Stairs: Yes: Internal: 13 steps; on right going up ?Has following equipment at home: Single point cane ? ?OCCUPATION: none ? ?PLOF: Independent ? ?  PATIENT GOALS to get stronger and walk ? ? ?OBJECTIVE:  ? ?DIAGNOSTIC FINDINGS: BLE weakness R>L ? ?PATIENT SURVEYS:  ?  ? ?COGNITION: ? Overall cognitive status:  history of bipolar    ?  ?SENSATION: ?WFL ? ?MUSCLE LENGTH: ?Hamstrings: Right NT deg; Left NT deg ?Thomas test: Right NT deg; Left NT deg ? ?POSTURE:  ?Forward flexed posture, knees hyperextended ? ?PALPATION: ?Tapping for quad activation ? ?LE ROM: WFL ? ?Active  ROM Right ?04/14/2022 Left ?04/14/2022  ?Hip flexion    ?Hip extension    ?Hip abduction    ?Hip adduction    ?Hip internal rotation    ?Hip external rotation    ?Knee flexion    ?Knee extension    ?Ankle dorsiflexion    ?Ankle plantarflexion    ?Ankle inversion    ?Ankle eversion    ? (Blank rows = not tested) ? ?LE MMT: ? ?MMT Right ?04/14/2022 Left ?04/14/2022  ?Hip flexion    ?Hip extension    ?Hip abduction    ?Hip adduction    ?Hip internal rotation    ?Hip external rotation    ?Knee flexion 3+/5 4-/5  ?Knee extension 4-/5 4/5  ?Ankle dorsiflexion 3+/5 4-/5  ?Ankle plantarflexion 3+/5 4-/5  ?Ankle inversion    ?Ankle eversion    ? (Blank rows = not tested) ? ?LOWER EXTREMITY SPECIAL TESTS:  ?STS: instructed on how to use RW, pushing off of mat with L UE and using R UE on RW ? ?FUNCTIONAL TESTS:  ?Stairs: 4 steps with MIN/MOD A with R HR, instructed on nonreciprocal gait ascending leading with LLE and descending with RLE ? ?GAIT: ?Distance walked: 8 ft  ?Assistive device utilized: Environmental consultant - 2 wheeled ?Level of assistance: Mod A followed with WC ?Comments: Verbal cues for step to gait moving RW first, BLE shaky and difficult to achieve and maintain TKE, forward flexed posture and downward gaze ? ? ? ?TODAY'S TREATMENT: ?See HEP ? ? ?PATIENT EDUCATION:  ?Education details: diagnosis, prognosis, call and schedule with PCP, HEP, POC ?Person educated: Patient ?Education method: Explanation, Demonstration, Tactile cues, Verbal cues, and Handouts ?Education comprehension: verbalized understanding, returned demonstration, verbal cues required, and tactile cues required ? ? ?HOME EXERCISE PROGRAM: ?Access Code: [email or web address] ?URL: [email or web address] ?Date: 04/14/2022 ?Prepared by: Kathreen Cornfield ? ?Exercises ?- Industrial/product designer with Single Rail Using Step-To Pattern (One Step at a Time)  - 1 x daily - 7 x weekly ?- Supine Heel Slide  - 1-2 x daily - 7 x weekly - 1-2 sets - 5 reps ?- Supine Knee Extension  Strengthening  - 1-2 x daily - 7 x weekly - 1-2 sets - 5 reps ?- Supine Short Arc Quad  - 1-2 x daily - 7 x weekly - 1-2 sets - 5 reps ?- Seated Long Arc Quad  - 1-2 x daily - 7 x weekly - 1-2 sets - 5 reps ? ?ASSESSMENT: ? ?CLINICAL IMPRESSION: ?Patient is a 21 y.o. female who was seen today for physical therapy evaluation and treatment for BLE weakness following Covid. Pt began experiencing weakness following Covid diagnosis 2 weeks ago and is now having difficulty walking. She fell down the stairs last week from her legs buckling. Her mother is currently helping her negotiate stairs to reach her bedroom and bathroom. She demonstrates impairments in balance, LE strength and endurance, antalgic gait, and difficulty with stairs. She was educated on diagnosis, prognosis, POC, and HEP verbalizing understanding and consent to tx. She would benefit from  skilled PT 2x/week for 4-6 weeks to address aforementioned impairments and return to PLOF.  ? ? ?OBJECTIVE IMPAIRMENTS decreased activity tolerance, decreased balance, decreased endurance, decreased mobility, difficulty walking, decreased strength, impaired perceived functional ability, improper body mechanics, and pain.  ? ?ACTIVITY LIMITATIONS cleaning, community activity, and meal prep.  ? ?PERSONAL FACTORS 3+ comorbidities: anxiety, asthma, GERD, nerve/muscle disease  are also affecting patient's functional outcome.  ? ? ?REHAB POTENTIAL: Good ? ?CLINICAL DECISION MAKING: Evolving/moderate complexity ? ?EVALUATION COMPLEXITY: Moderate ? ? ?GOALS: ?Goals reviewed with patient? No ? ?SHORT TERM GOALS: Target date: 05/05/2022 ? ?Pt will be I and compliant with initial HEP. ?Baseline: provided at eval ?Goal status: INITIAL ? ? ?LONG TERM GOALS: Target date: 06/09/2022 ? ?Pt will be independent with advanced HEP for continued strength and endurance on her own. ?Baseline: not provided yet. ?Goal status: INITIAL ? ?2.  Pt will demonstrate 4+/5 BLE strength for improved  ability to stand and ambulate without buckling for reduced fall risk. ?Baseline: 3+ 5 to 4-/5 ?Goal status: INITIAL ? ?3.  Pt will ambulate 50 feet without AD and modified independent for increased independence. ?[place]

## 2022-04-14 NOTE — Patient Instructions (Signed)
Access Code: [email or web address] ?URL: [email or web address] ?Date: 04/14/2022 ?Prepared by: Kathreen Cornfield ? ?Exercises ?- Industrial/product designer with Single Rail Using Step-To Pattern (One Step at a Time)  - 1 x daily - 7 x weekly ?- Supine Heel Slide  - 1-2 x daily - 7 x weekly - 1-2 sets - 5 reps ?- Supine Knee Extension Strengthening  - 1-2 x daily - 7 x weekly - 1-2 sets - 5 reps ?- Supine Short Arc Quad  - 1-2 x daily - 7 x weekly - 1-2 sets - 5 reps ?- Seated Long Arc Quad  - 1-2 x daily - 7 x weekly - 1-2 sets - 5 reps ?

## 2022-04-17 ENCOUNTER — Other Ambulatory Visit (HOSPITAL_COMMUNITY): Payer: Self-pay

## 2022-04-18 ENCOUNTER — Telehealth: Payer: Self-pay

## 2022-04-18 NOTE — Telephone Encounter (Signed)
Patient calls nurse line requesting an apt for DME orders.  ? ?Patient reports she has been seeing PT, however is not having much improvement. Patient reports her PT told her a walker may be needed.  ? ?Patient denies any new symptoms of weakness or saddle anesthesia .  ? ?Patient scheduled for 5/4 for evaluation.  ?

## 2022-04-20 ENCOUNTER — Ambulatory Visit (INDEPENDENT_AMBULATORY_CARE_PROVIDER_SITE_OTHER): Payer: Medicaid Other | Admitting: Family Medicine

## 2022-04-20 VITALS — BP 117/70 | HR 122 | Wt 248.4 lb

## 2022-04-20 DIAGNOSIS — R269 Unspecified abnormalities of gait and mobility: Secondary | ICD-10-CM

## 2022-04-20 NOTE — Patient Instructions (Signed)
Thank you for coming to see me today. It was a pleasure. ? ?Walker prescription sent ? ?Follow up with [facility] to discuss medications ? ?Please follow-up with PCP as needed ? ?If you have any questions or concerns, please do not hesitate to call the office at [phone]. ? ?Best,  ? ?Carollee Leitz, MD   ?

## 2022-04-20 NOTE — Progress Notes (Signed)
? ? ?  SUBJECTIVE:  ? ?CHIEF COMPLAINT / HPI: requesting prescription for walker ? ?Recent COVID infection now with gait instability.  Was referred to physical therapy and recommended walker to help with gait and mobility.  Mom reports it has been a little improvement this past week.Reports with evaluated by neurology and recent MRI head  negative.  Was referred to psychiatry who recently diagnosed with Tourette's syndrome.   ? ? ?PERTINENT  PMH / PSH:  ?Tourette's syndrome ? ? ?OBJECTIVE:  ? ?BP 117/70   Pulse (!) 122   Wt 248 lb 6.4 oz (112.7 kg)   LMP 03/30/2022   BMI 48.51 kg/m?   ? ?General: Alert, no acute distress ?Cardio: Normal S1 and S2, RRR, no r/m/g ?Pulm: CTAB, normal work of breathing ?Extremities: No lower extremity edema. ?Neuro: Sporadic involuntary upper and lower extremity forceful outbursts.  Difficult to assess strength and weakness, no good evaluate. ? ?ASSESSMENT/PLAN:  ? ?Gait disturbance ?Continue physical therapy ?Prescription for walker as recommended by PT sent. ?Continue to follow-up with psychiatry ?Follow-up with PCP as needed. ?  ? ? ?Dana Allan, MD ?[facility]  ?

## 2022-04-23 ENCOUNTER — Encounter: Payer: Self-pay | Admitting: Family Medicine

## 2022-04-23 DIAGNOSIS — R269 Unspecified abnormalities of gait and mobility: Secondary | ICD-10-CM | POA: Insufficient documentation

## 2022-04-23 NOTE — Assessment & Plan Note (Addendum)
Continue physical therapy ?Prescription for walker as recommended by PT sent. ?Continue to follow-up with psychiatry ?Follow-up with PCP as needed. ?

## 2022-04-26 ENCOUNTER — Ambulatory Visit: Payer: Medicaid Other | Attending: Family Medicine | Admitting: Physical Therapy

## 2022-04-26 ENCOUNTER — Encounter: Payer: Self-pay | Admitting: Physical Therapy

## 2022-04-26 DIAGNOSIS — M6281 Muscle weakness (generalized): Secondary | ICD-10-CM | POA: Diagnosis present

## 2022-04-26 DIAGNOSIS — R262 Difficulty in walking, not elsewhere classified: Secondary | ICD-10-CM | POA: Diagnosis present

## 2022-04-26 DIAGNOSIS — R293 Abnormal posture: Secondary | ICD-10-CM | POA: Diagnosis present

## 2022-04-26 NOTE — Therapy (Signed)
?OUTPATIENT PHYSICAL THERAPY TREATMENT NOTE ? ? ?Patient Name: Sherri [name] ?MRN: [id number] ?DOB:01-23-2001, 21 y.o., female ?Today's Date: 04/26/2022 ? ? ?END OF SESSION:  ? PT End of Session - 04/26/22 1237   ? ? Visit Number 2   ? Number of Visits 27   ? Date for PT Re-Evaluation 05/12/22   ? Authorization Type [employer] Medicaid   ? PT Start Time 1235   ? PT Stop Time 1313   ? PT Time Calculation (min) 38 min   ? ?  ?  ? ?  ? ? ?Past Medical History:  ?Diagnosis Date  ?  Chronic pain 02/02/2020  ? Abnormal vision screen 08/26/2015  ? Acanthosis nigricans 02/26/2020  ? Acid reflux   ? Allergy   ? Seasonal allergies  ? Anxiety   ? Phreesia 11/24/2020  ? Asthma   ? Atopic dermatitis 07/14/2013  ? Carpal tunnel syndrome, bilateral 11/21/2018  ? S/p bilat surgery, release  ? Chest pain 07/16/2019  ? Concussion 02/16/2021  ? Approx 2/15 d/t hit posterior skull against wall during tic.   ? Constipation 06/17/2013  ? Cough 01/29/2021  ? COVID-19 01/22/2020  ? Eczema   ? Elevated blood pressure reading 02/26/2020  ? Gallstones   ? GERD (gastroesophageal reflux disease)   ? Phreesia 11/24/2020  ? Headache   ? Obesity   ? Pain of both hip joints 11/10/2019  ? PCOS (polycystic ovarian syndrome) 02/26/2020  ? Seasonal allergies   ? Tic   ? ?Past Surgical History:  ?Procedure Laterality Date  ? BILATERAL CARPAL TUNNEL RELEASE    ? CHOLECYSTECTOMY N/A 08/08/2017  ? Procedure: LAPAROSCOPIC CHOLECYSTECTOMY;  Surgeon: Kandice Hams, MD;  Location: [facility] OR;  Service: General;  Laterality: N/A;  ? UPPER GASTROINTESTINAL ENDOSCOPY    ? ?Patient Active Problem List  ? Diagnosis Date Noted  ? Gait disturbance 04/23/2022  ? Lower extremity weakness 04/13/2022  ? Major depression 10/07/2021  ? Abnormal uterine bleeding (AUB) 05/12/2021  ? Movement disorder 04/14/2021  ? Macromastia 03/16/2021  ? Thoracic back pain 03/16/2021  ? Poor appetite 03/16/2021  ? Hand pain, not arthralgia 02/16/2021  ? Healthcare maintenance 01/29/2021  ? Tic disorder,  unspecified 08/10/2020  ? Hirsutism 02/26/2020  ? Migraine without aura and without status migrainosus, not intractable 02/10/2020  ? Moderate persistent asthma 02/10/2020  ? Irritable bowel syndrome 02/10/2020  ? Chronic nonintractable headache 02/02/2020  ? Pre-diabetes 07/16/2019  ? Breast pain 07/16/2019  ? Sleeping difficulty 07/16/2019  ? Menorrhagia with irregular cycle 07/16/2019  ? Chronic low back pain without sciatica 04/26/2017  ? Gastroesophageal reflux  07/29/2015  ? Vitamin D deficiency 07/29/2013  ? Morbid obesity (HCC) 07/29/2013  ? Asthma, chronic 07/14/2013  ? Allergic rhinitis 07/14/2013  ? ?PCP: Dr. Fayette Pho, MD ?  ?REFERRING PROVIDER: Dr. Denny Levy, MD ?  ?REFERRING DIAG: R53.1 (ICD-10-CM) - Weakness ? ?THERAPY DIAG:  ?Muscle weakness (generalized) ? ?Difficulty in walking, not elsewhere classified ? ?Abnormal posture ? ?PERTINENT HISTORY: Covid April 2023  ? ?PRECAUTIONS: none ? ?SUBJECTIVE: I think I am a little better. I have fallen 5 times since I was here last. I do not have the walker yet.  ? ?PAIN:  ?Are you having pain? Yes: NPRS scale: 7.5/10, up to 10/10 ?Pain location: ankles and thighs ?Pain description: sharp pains ?Aggravating factors: walking ?Relieving factors: resting ? ? ?OBJECTIVE: (objective measures completed at initial evaluation unless otherwise dated) ? ? ?OBJECTIVE:  ?  ?DIAGNOSTIC FINDINGS: BLE weakness  R>L ?  ?PATIENT SURVEYS:  ?  ?  ?COGNITION: ?          Overall cognitive status:  history of bipolar                     ?           ?SENSATION: ?WFL ?  ?MUSCLE LENGTH: ?Hamstrings: Right NT deg; Left NT deg ?Thomas test: Right NT deg; Left NT deg ?  ?POSTURE:  ?Forward flexed posture, knees hyperextended ?  ?PALPATION: ?Tapping for quad activation ?  ?LE ROM: WFL ?  ?Active ROM Right ?04/14/2022 Left ?04/14/2022  ?Hip flexion      ?Hip extension      ?Hip abduction      ?Hip adduction      ?Hip internal rotation      ?Hip external rotation      ?Knee flexion       ?Knee extension      ?Ankle dorsiflexion      ?Ankle plantarflexion      ?Ankle inversion      ?Ankle eversion      ? (Blank rows = not tested) ?  ?LE MMT: ?  ?MMT Right ?04/14/2022 Left ?04/14/2022  ?Hip flexion      ?Hip extension      ?Hip abduction      ?Hip adduction      ?Hip internal rotation      ?Hip external rotation      ?Knee flexion 3+/5 4-/5  ?Knee extension 4-/5 4/5  ?Ankle dorsiflexion 3+/5 4-/5  ?Ankle plantarflexion 3+/5 4-/5  ?Ankle inversion      ?Ankle eversion      ? (Blank rows = not tested) ?  ?LOWER EXTREMITY SPECIAL TESTS:  ?STS: instructed on how to use RW, pushing off of mat with L UE and using R UE on RW ?  ?FUNCTIONAL TESTS:  ?Stairs: 4 steps with MIN/MOD A with R HR, instructed on nonreciprocal gait ascending leading with LLE and descending with RLE ?  ?GAIT: ?Distance walked: 8 ft  ?Assistive device utilized: Environmental consultantWalker - 2 wheeled ?Level of assistance: Mod A followed with WC ?Comments: Verbal cues for step to gait moving RW first, BLE shaky and difficult to achieve and maintain TKE, forward flexed posture and downward gaze ?  ?  ?  ?[facility] TREATMENT[facility] ?[facility]                                                [facility]: [facility] ?Therapeutic Exercise: ?Seated LAQ x 10- cues for DF ?Seated MARCH x 10 ?Seated heel toe raises 10 x 2  ?Supine heel slides x 5 each ?SLR x 5 each , 15 degree qad lag bilat ?Side clam x 10 each  ?Bridge 10 x 2  ?Gait :  ?110 ft with SPC and HHA from therapist to prevent fall at start of session ?6230ft , 2780ft with RW and CGA at end of session. (Used clinic RW on the way out of clinic) ? ?INITIAL TREATMENT: ?See HEP ?  ?  ?PATIENT EDUCATION:  ?Education details: diagnosis, prognosis, call and schedule with PCP, HEP, POC ?Person educated: Patient ?Education method: Explanation, Demonstration, Tactile cues, Verbal cues, and Handouts ?Education comprehension: verbalized understanding, returned demonstration, verbal cues required, and tactile cues required ?  ?   ?  HOME EXERCISE PROGRAM: ?Access Code: [email or web address] ?URL: [email or web address] ?Date: 04/26/2022 ?Prepared by: Jannette Spanner ? ?Exercises ?- Psychologist, counselling with Single Rail Using Step-To Pattern (One Step at a Time)  - 1 x daily - 7 x weekly ?- Supine Heel Slide  - 1-2 x daily - 7 x weekly - 1-2 sets - 5 reps ?- Supine Knee Extension Strengthening  - 1-2 x daily - 7 x weekly - 1-2 sets - 5 reps ?- Supine Short Arc Quad  - 1-2 x daily - 7 x weekly - 1-2 sets - 5 reps ?- Seated Long Arc Quad  - 1-2 x daily - 7 x weekly - 1-2 sets - 5 reps ?- Seated Heel Toe Raises  - 1 x daily - 7 x weekly - 3 sets - 10 reps ?- Supine Bridge  - 1 x daily - 7 x weekly - 3 sets - 10 reps ?- Beginner Clam  - 1 x daily - 7 x weekly - 3 sets - 10 reps ?- Small Range Straight Leg Raise  - 1 x daily - 7 x weekly - 1 sets - 10 reps ?ASSESSMENT: ?  ?CLINICAL IMPRESSION: ?Patient is a 21 y.o. female who was seen today for physical therapy treatment for BLE weakness following Covid. Pt arrives with [facility] and difficulty walking, knees flexed. HHA offered to assist her into clinic. She is still waiting on a call to tell her that RW is ready, referral was placed 6 days ago. Reviewed HEP and she has been compliant with 2 of her HEP exercises including heel slides and SAQ. Progressed therex today and updated HEP. She reports improvement in walking tolerance for short distances. She has 7-10/10 pain in ankles/shins with ambulation and has fallen 5 times since her initial PT evaluation. Attempted to call MD office regarding RW referral, closed for lunch. Phone number given to mom at end of session to call MD and ask about the RW referral and the importance of obtaining it as soon as possible.  ? ? ?EVAL: Pt began experiencing weakness following Covid diagnosis 2 weeks ago and is now having difficulty walking. She fell down the stairs last week from her legs buckling. Her mother is currently helping her negotiate stairs to reach her bedroom  and bathroom. She demonstrates impairments in balance, LE strength and endurance, antalgic gait, and difficulty with stairs. She was educated on diagnosis, prognosis, POC, and HEP verbalizing understanding and c

## 2022-04-27 ENCOUNTER — Telehealth: Payer: Self-pay

## 2022-04-27 NOTE — Telephone Encounter (Signed)
Community message sent to [employer] for DME walker.  ?

## 2022-04-27 NOTE — Telephone Encounter (Signed)
Confirmation received from [employer].  

## 2022-04-28 ENCOUNTER — Ambulatory Visit: Payer: Medicaid Other

## 2022-04-28 DIAGNOSIS — R293 Abnormal posture: Secondary | ICD-10-CM

## 2022-04-28 DIAGNOSIS — M6281 Muscle weakness (generalized): Secondary | ICD-10-CM

## 2022-04-28 DIAGNOSIS — R262 Difficulty in walking, not elsewhere classified: Secondary | ICD-10-CM

## 2022-04-28 NOTE — Therapy (Signed)
?OUTPATIENT PHYSICAL THERAPY TREATMENT NOTE ? ? ?Patient Name: Sherri [name] ?MRN: [id number] ?DOB:[birth year], 21 y.o., female ?Today's Date: 04/28/2022 ? ? ?END OF SESSION:  ? PT End of Session - 04/28/22 1403   ? ? Visit Number 3   ? Number of Visits 27   ? Date for PT Re-Evaluation 05/12/22   ? Authorization Type [employer] Medicaid   ? PT Start Time 1303   ? PT Stop Time 1345   ? PT Time Calculation (min) 42 min   ? ?  ?  ? ?  ? ? ? ?Past Medical History:  ?Diagnosis Date  ?  Chronic pain 02/02/2020  ? Abnormal vision screen 08/26/2015  ? Acanthosis nigricans 02/26/2020  ? Acid reflux   ? Allergy   ? Seasonal allergies  ? Anxiety   ? Phreesia 11/24/2020  ? Asthma   ? Atopic dermatitis 07/14/2013  ? Carpal tunnel syndrome, bilateral 11/21/2018  ? S/p bilat surgery, release  ? Chest pain 07/16/2019  ? Concussion 02/16/2021  ? Approx 2/15 d/t hit posterior skull against wall during tic.   ? Constipation 06/17/2013  ? Cough 01/29/2021  ? COVID-19 01/22/2020  ? Eczema   ? Elevated blood pressure reading 02/26/2020  ? Gallstones   ? GERD (gastroesophageal reflux disease)   ? Phreesia 11/24/2020  ? Headache   ? Obesity   ? Pain of both hip joints 11/10/2019  ? PCOS (polycystic ovarian syndrome) 02/26/2020  ? Seasonal allergies   ? Tic   ? ?Past Surgical History:  ?Procedure Laterality Date  ? BILATERAL CARPAL TUNNEL RELEASE    ? CHOLECYSTECTOMY N/A 08/08/2017  ? Procedure: LAPAROSCOPIC CHOLECYSTECTOMY;  Surgeon: Kandice Hams, MD;  Location: [facility] OR;  Service: General;  Laterality: N/A;  ? UPPER GASTROINTESTINAL ENDOSCOPY    ? ?Patient Active Problem List  ? Diagnosis Date Noted  ? Gait disturbance 04/23/2022  ? Lower extremity weakness 04/13/2022  ? Major depression 10/07/2021  ? Abnormal uterine bleeding (AUB) 05/12/2021  ? Movement disorder 04/14/2021  ? Macromastia 03/16/2021  ? Thoracic back pain 03/16/2021  ? Poor appetite 03/16/2021  ? Hand pain, not arthralgia 02/16/2021  ? Healthcare maintenance 01/29/2021  ? Tic disorder,  unspecified 08/10/2020  ? Hirsutism 02/26/2020  ? Migraine without aura and without status migrainosus, not intractable 02/10/2020  ? Moderate persistent asthma 02/10/2020  ? Irritable bowel syndrome 02/10/2020  ? Chronic nonintractable headache 02/02/2020  ? Pre-diabetes 07/16/2019  ? Breast pain 07/16/2019  ? Sleeping difficulty 07/16/2019  ? Menorrhagia with irregular cycle 07/16/2019  ? Chronic low back pain without sciatica 04/26/2017  ? Gastroesophageal reflux  07/29/2015  ? Vitamin D deficiency 07/29/2013  ? Morbid obesity (HCC) 07/29/2013  ? Asthma, chronic 07/14/2013  ? Allergic rhinitis 07/14/2013  ? ?PCP: Dr. Fayette Pho, MD ?  ?REFERRING PROVIDER: Dr. Denny Levy, MD ?  ?REFERRING DIAG: R53.1 (ICD-10-CM) - Weakness ? ?THERAPY DIAG:  ?Muscle weakness (generalized) ? ?Difficulty in walking, not elsewhere classified ? ?Abnormal posture ? ?PERTINENT HISTORY: Covid April 2023  ? ?PRECAUTIONS: none ? ?SUBJECTIVE: Pt talked to the doctor yesterday and was told they forgot to put the order in and would do it that day. Pt will call back today to check. She collapsed off her bed after last visit due to fatigue, but has not fallen since. She wants to hurry up and get better because her mom is getting her a cat. "It's getting easier to walk up and down the stairs. Some days I don't  have pain." ? ?PAIN:  ?Are you having pain? Yes: NPRS scale: 4.5/10, up to 10/10 ?Pain location: ankles and thighs, little of knees ?Pain description: sharp pains ?Aggravating factors: walking ?Relieving factors: resting ? ? ?OBJECTIVE: (objective measures completed at initial evaluation unless otherwise dated) ? ? ?OBJECTIVE:  ?  ?DIAGNOSTIC FINDINGS: BLE weakness R>L ?  ?PATIENT SURVEYS:  ?  ?  ?COGNITION: ?          Overall cognitive status:  history of bipolar                     ?           ?SENSATION: ?WFL ?  ?MUSCLE LENGTH: ?Hamstrings: Right NT deg; Left NT deg ?Thomas test: Right NT deg; Left NT deg ?  ?POSTURE:  ?Forward flexed  posture, knees hyperextended ?  ?PALPATION: ?Tapping for quad activation ?  ?LE ROM: WFL ?  ?Active ROM Right ?04/14/2022 Left ?04/14/2022  ?Hip flexion      ?Hip extension      ?Hip abduction      ?Hip adduction      ?Hip internal rotation      ?Hip external rotation      ?Knee flexion      ?Knee extension      ?Ankle dorsiflexion      ?Ankle plantarflexion      ?Ankle inversion      ?Ankle eversion      ? (Blank rows = not tested) ?  ?LE MMT: ?  ?MMT Right ?04/14/2022 Left ?04/14/2022  ?Hip flexion      ?Hip extension      ?Hip abduction      ?Hip adduction      ?Hip internal rotation      ?Hip external rotation      ?Knee flexion 3+/5 4-/5  ?Knee extension 4-/5 4/5  ?Ankle dorsiflexion 3+/5 4-/5  ?Ankle plantarflexion 3+/5 4-/5  ?Ankle inversion      ?Ankle eversion      ? (Blank rows = not tested) ?  ?LOWER EXTREMITY SPECIAL TESTS:  ?STS: instructed on how to use RW, pushing off of mat with L UE and using R UE on RW ?  ?FUNCTIONAL TESTS:  ?Stairs: 4 steps with MIN/MOD A with R HR, instructed on nonreciprocal gait ascending leading with LLE and descending with RLE ?  ?GAIT: ?Distance walked: 8 ft  ?Assistive device utilized: Environmental consultantWalker - 2 wheeled ?Level of assistance: Mod A followed with WC ?Comments: Verbal cues for step to gait moving RW first, BLE shaky and difficult to achieve and maintain TKE, forward flexed posture and downward gaze ?  ?  ?  ?TODAY'S TREATMENT: ? ?[facility] Adult PT Treatment:                                                DATE: 04/28/22 ?Therapeutic Exercise: ?Seated LAQ x 10 ?Seated MARCH x 10 ?Seated heel toe raises 2 x 10 ?Seated SLR 2 x 5 each  ?Supine SLR 2 x 8 each ?Bridge with ball b/t knees 2 x 10 ?S/L Clam x10 each ?Gait :  ?110 ft with RW and close SBA from therapist to prevent fall at start of session, pt demo large step lengths and lacks TKE --> Vcs to shorten step lengths and rely on UE for upright posture  versus forward lean ?110 ft with RW and supervision with pt demo step to gait L to R  and excellent use or RW with no buckling noted. (Used clinic RW on the way out of clinic) ?Self Care: ?Continue HEP, discussed form ? ?[facility] Adult PT Treatment:                                                DATE: 04/26/22 ?Therapeutic Exercise: ?Seated LAQ x 10- cues for DF ?Seated MARCH x 10 ?Seated heel toe raises 10 x 2  ?Supine heel slides x 5 each ?SLR x 5 each , 15 degree qad lag bilat ?Side clam x 10 each  ?Bridge 10 x 2  ?Gait :  ?110 ft with SPC and HHA from therapist to prevent fall at start of session ?30ft , 27ft with RW and CGA at end of session. (Used clinic RW on the way out of clinic) ? ?INITIAL TREATMENT: ?See HEP ?  ?  ?PATIENT EDUCATION:  ?Education details: diagnosis, prognosis, call and schedule with PCP, HEP, POC ?Person educated: Patient ?Education method: Explanation, Demonstration, Tactile cues, Verbal cues, and Handouts ?Education comprehension: verbalized understanding, returned demonstration, verbal cues required, and tactile cues required ?  ?  ?HOME EXERCISE PROGRAM: ?Access Code: [email or web address] ?URL: [email or web address] ?Date: 04/26/2022 ?Prepared by: Jannette Spanner ? ?Exercises ?- Psychologist, counselling with Single Rail Using Step-To Pattern (One Step at a Time)  - 1 x daily - 7 x weekly ?- Supine Heel Slide  - 1-2 x daily - 7 x weekly - 1-2 sets - 5 reps ?- Supine Knee Extension Strengthening  - 1-2 x daily - 7 x weekly - 1-2 sets - 5 reps ?- Supine Short Arc Quad  - 1-2 x daily - 7 x weekly - 1-2 sets - 5 reps ?- Seated Long Arc Quad  - 1-2 x daily - 7 x weekly - 1-2 sets - 5 reps ?- Seated Heel Toe Raises  - 1 x daily - 7 x weekly - 3 sets - 10 reps ?- Supine Bridge  - 1 x daily - 7 x weekly - 3 sets - 10 reps ?- Beginner Clam  - 1 x daily - 7 x weekly - 3 sets - 10 reps ?- Small Range Straight Leg Raise  - 1 x daily - 7 x weekly - 1 sets - 10 reps ?ASSESSMENT: ?  ?CLINICAL IMPRESSION: ?Patient arrives with [facility] again, verbalizing she called the provider yesterday and the order  had not been placed yet. Attempted to call MD office regarding RW referral, closed for lunch. Pt advised to call directly after visit to follow-up. She ambulated with RW to session with antalgic gait, near

## 2022-05-01 ENCOUNTER — Encounter (HOSPITAL_COMMUNITY): Payer: Self-pay

## 2022-05-01 ENCOUNTER — Ambulatory Visit (HOSPITAL_COMMUNITY)
Admission: EM | Admit: 2022-05-01 | Discharge: 2022-05-01 | Disposition: A | Discharge status: Home / Self Care | Payer: Medicaid Other | Attending: Internal Medicine | Admitting: Internal Medicine

## 2022-05-01 ENCOUNTER — Ambulatory Visit: Payer: Medicaid Other | Admitting: Family Medicine

## 2022-05-01 DIAGNOSIS — R1084 Generalized abdominal pain: Secondary | ICD-10-CM | POA: Diagnosis present

## 2022-05-01 DIAGNOSIS — R112 Nausea with vomiting, unspecified: Secondary | ICD-10-CM | POA: Diagnosis not present

## 2022-05-01 DIAGNOSIS — R197 Diarrhea, unspecified: Secondary | ICD-10-CM | POA: Diagnosis present

## 2022-05-01 DIAGNOSIS — B349 Viral infection, unspecified: Secondary | ICD-10-CM | POA: Diagnosis present

## 2022-05-01 LAB — COMPREHENSIVE METABOLIC PANEL
ALT: 20 U/L (ref 0–44)
AST: 18 U/L (ref 15–41)
Albumin: 3.4 g/dL — ABNORMAL LOW (ref 3.5–5.0)
Alkaline Phosphatase: 64 U/L (ref 38–126)
Anion gap: 8 (ref 5–15)
BUN: 8 mg/dL (ref 6–20)
CO2: 24 mmol/L (ref 22–32)
Calcium: 9.4 mg/dL (ref 8.9–10.3)
Chloride: 105 mmol/L (ref 98–111)
Creatinine, Ser: 0.86 mg/dL (ref 0.44–1.00)
GFR, Estimated: >60 mL/min (ref >60)
Glucose, Bld: 100 mg/dL — ABNORMAL HIGH (ref 70–99)
Potassium: 3.7 mmol/L (ref 3.5–5.1)
Sodium: 137 mmol/L (ref 135–145)
Total Bilirubin: 0.5 mg/dL (ref 0.3–1.2)
Total Protein: 7 g/dL (ref 6.5–8.1)

## 2022-05-01 LAB — CBC
HCT: 41.8 % (ref 36.0–46.0)
Hemoglobin: 13.5 g/dL (ref 12.0–15.0)
MCH: 27.8 pg (ref 26.0–34.0)
MCHC: 32.3 g/dL (ref 30.0–36.0)
MCV: 86.2 fL (ref 80.0–100.0)
Platelets: 388 10*3/uL (ref 150–400)
RBC: 4.85 MIL/uL (ref 3.87–5.11)
RDW: 14.1 % (ref 11.5–15.5)
WBC: 8.9 10*3/uL (ref 4.0–10.5)
nRBC: 0 % (ref 0.0–0.2)

## 2022-05-01 MED ORDER — ONDANSETRON HCL 4 MG/2ML IJ SOLN
4.0000 mg | Freq: Once | INTRAMUSCULAR | Status: AC
  Administered 2022-05-01: 4 mg via INTRAMUSCULAR

## 2022-05-01 MED ORDER — ONDANSETRON HCL 4 MG/2ML IJ SOLN
INTRAMUSCULAR | Status: AC
  Filled 2022-05-01: qty 2

## 2022-05-01 NOTE — ED Triage Notes (Signed)
Pt c/o severe abdominal pain all over n/v x7 since last night. Took Zofran, Carafate, and bentyl with no relief.  ?

## 2022-05-01 NOTE — Discharge Instructions (Signed)
You were given nausea medication in urgent care today.  Please ensure adequate fluid hydration.  Go to the hospital if symptoms persist or worsen. ?

## 2022-05-01 NOTE — Progress Notes (Deleted)
     SUBJECTIVE:   CHIEF COMPLAINT / HPI:   Sherri [name] is a 21 y.o. female presents for ***   ***  Flowsheet Row Office Visit from 04/11/2022 in [facility]  PHQ-9 Total Score 21        There are no preventive care reminders to display for this patient.    PERTINENT  PMH / PSH:   OBJECTIVE:   There were no vitals taken for this visit.   General: Alert, no acute distress Cardio: Normal S1 and S2, RRR, no r/m/g Pulm: CTAB, normal work of breathing Abdomen: Bowel sounds normal. Abdomen soft and non-tender.  Extremities: No peripheral edema.  Neuro: Cranial nerves grossly intact   ASSESSMENT/PLAN:   No problem-specific Assessment & Plan notes found for this encounter.    Towanda Octave, MD PGY-3 [facility]

## 2022-05-01 NOTE — ED Provider Notes (Signed)
?[facility] ? ? ? ?CSN: [id number] ?Arrival date & time: 05/01/22  1704 ? ? ?  ? ?History   ?Chief Complaint ?Chief Complaint  ?Patient presents with  ? Abdominal Pain  ? Emesis  ? ? ?HPI ?Sherri [name] is a 21 y.o. female.  ? ?Patient presents with abdominal pain, nausea, vomiting, diarrhea that started last night.  Patient has taken Zofran, Carafate, Bentyl with no improvement in symptoms.  Patient does have a history of "chronic stomach problems".  She states that she has had similar symptoms before and received IM Zofran that resolved symptoms.  Denies any associated upper respiratory symptoms or known fevers at home.  Denies any known sick contacts.  Patient having difficulty keeping food and fluids down and tolerating oral Zofran.  Denies blood in stool or emesis.  Stomach pain is generalized and is rated 7/10 on pain scale to 10/10 on pain scale.  Eating makes pain worse. ? ? ?Abdominal Pain ?Emesis ? ?Past Medical History:  ?Diagnosis Date  ?  Chronic pain 02/02/2020  ? Abnormal vision screen 08/26/2015  ? Acanthosis nigricans 02/26/2020  ? Acid reflux   ? Allergy   ? Seasonal allergies  ? Anxiety   ? Phreesia 11/24/2020  ? Asthma   ? Atopic dermatitis 07/14/2013  ? Carpal tunnel syndrome, bilateral 11/21/2018  ? S/p bilat surgery, release  ? Chest pain 07/16/2019  ? Concussion 02/16/2021  ? Approx 2/15 d/t hit posterior skull against wall during tic.   ? Constipation 06/17/2013  ? Cough 01/29/2021  ? COVID-19 01/22/2020  ? Eczema   ? Elevated blood pressure reading 02/26/2020  ? Gallstones   ? GERD (gastroesophageal reflux disease)   ? Phreesia 11/24/2020  ? Headache   ? Obesity   ? Pain of both hip joints 11/10/2019  ? PCOS (polycystic ovarian syndrome) 02/26/2020  ? Seasonal allergies   ? Tic   ? ? ?Patient Active Problem List  ? Diagnosis Date Noted  ? Gait disturbance 04/23/2022  ? Lower extremity weakness 04/13/2022  ? Major depression 10/07/2021  ? Abnormal uterine bleeding (AUB) 05/12/2021  ?  Movement disorder 04/14/2021  ? Macromastia 03/16/2021  ? Thoracic back pain 03/16/2021  ? Poor appetite 03/16/2021  ? Hand pain, not arthralgia 02/16/2021  ? Healthcare maintenance 01/29/2021  ? Tic disorder, unspecified 08/10/2020  ? Hirsutism 02/26/2020  ? Migraine without aura and without status migrainosus, not intractable 02/10/2020  ? Moderate persistent asthma 02/10/2020  ? Irritable bowel syndrome 02/10/2020  ? Chronic nonintractable headache 02/02/2020  ? Pre-diabetes 07/16/2019  ? Breast pain 07/16/2019  ? Sleeping difficulty 07/16/2019  ? Menorrhagia with irregular cycle 07/16/2019  ? Chronic low back pain without sciatica 04/26/2017  ? Gastroesophageal reflux  07/29/2015  ? Vitamin D deficiency 07/29/2013  ? Morbid obesity (HCC) 07/29/2013  ? Asthma, chronic 07/14/2013  ? Allergic rhinitis 07/14/2013  ? ? ?Past Surgical History:  ?Procedure Laterality Date  ? BILATERAL CARPAL TUNNEL RELEASE    ? CHOLECYSTECTOMY N/A 08/08/2017  ? Procedure: LAPAROSCOPIC CHOLECYSTECTOMY;  Surgeon: Kandice HamsAdibe, Obinna O, MD;  Location: [facility];  Service: General;  Laterality: N/A;  ? UPPER GASTROINTESTINAL ENDOSCOPY    ? ? ?OB History   ?No obstetric history on file. ?  ? ? ? ?Home Medications   ? ?Prior to Admission medications   ?Medication Sig Start Date End Date Taking? Authorizing Provider  ?albuterol (VENTOLIN HFA) 108 (90 Base) MCG/ACT inhaler Inhale 2 puffs into the lungs every 6 (six) hours  as needed for wheezing or shortness of breath. 01/26/22   Fayette Pho, MD  ?benzonatate (TESSALON) 200 MG capsule Take 1 capsule (200 mg total) by mouth 2 (two) times daily as needed for cough. ?Patient not taking: Reported on 04/14/2022 04/04/22   Fayette Pho, MD  ?dicyclomine (BENTYL) 10 MG capsule Take 1 capsule (10 mg total) by mouth 2 (two) times daily as needed for spasms. 04/04/21   Arnaldo Natal, NP  ?etonogestrel (NEXPLANON) 68 MG IMPL implant 1 each (68 mg total) by Subdermal route once.    Verneda Skill,  FNP  ?famotidine (PEPCID) 20 MG tablet Take 1 tablet (20 mg total) by mouth at bedtime. 11/18/21   Arnaldo Natal, NP  ?FLOVENT HFA 110 MCG/ACT inhaler Inhale 2 puffs into the lungs 2 (two) times daily. 09/26/21   Reece Leader, DO  ?hydrOXYzine (ATARAX) 10 MG tablet Take 1 tablet (10 mg total) by mouth 3 (three) times daily as needed. ?Patient not taking: Reported on 04/14/2022 01/30/22   Shanna Cisco, NP  ?linaclotide [facility]) 72 MCG capsule Take 1 capsule (72 mcg total) by mouth daily before breakfast. 03/28/22 04/27/22  Doree Albee, PA-C  ?melatonin 3 MG TABS tablet Take 1 tablet (3 mg total) by mouth at bedtime. 04/13/22   Mcneil Sober, NP  ?metFORMIN (GLUCOPHAGE) 500 MG tablet Take 1 tablet (500 mg total) by mouth 2 (two) times daily with a meal. 11/01/21   Fayette Pho, MD  ?nirmatrelvir & ritonavir (PAXLOVID, 300/100,) 20 x 150 MG & 10 x 100MG  TBPK Take 3 tablets by mouth 2 (two) times daily for 5 days ?Patient not taking: Reported on 04/14/2022 04/04/22   04/06/22, MD  ?OLANZapine-Samidorphan (LYBALVI) 10-10 MG TABS Take 1 tablet by mouth at bedtime. 01/30/22   02/01/22, NP  ?ondansetron (ZOFRAN) 8 MG tablet Take 0.5-1 tablets (4-8 mg total) by mouth every 6 (six) hours as needed for nausea & vomiting 03/23/22   05/23/22, PA-C  ?pantoprazole (PROTONIX) 20 MG tablet Take 1 tablet (20 mg total) by mouth daily. 11/01/21   11/03/21, MD  ?promethazine (PHENERGAN) 25 MG tablet Take 1 tablet (25 mg total) by mouth every 6 (six) hours as needed for nausea or vomiting. 03/23/22   05/23/22, PA-C  ?Spacer/Aero Chamber Mouthpiece MISC 1 Device by Does not apply route as needed. 11/21/17   14/5/18, MD  ?traZODone (DESYREL) 50 MG tablet Take 1 tablet (50 mg total) by mouth at bedtime. 01/30/22   02/01/22, NP  ?triamcinolone cream in [place]in Creme Apply 1 application topically 2 (two) times daily. Do not use for more than 7 consecutive days 03/02/22    03/04/22, MD  ? ? ?Family History ?Family History  ?Problem Relation Age of Onset  ? Asthma Mother   ? Arthritis Mother   ? Depression Mother   ? Diabetes Mother   ? Hypertension Mother   ? Obesity Mother   ? Irritable bowel syndrome Mother   ? Asthma Sister   ? Learning disabilities Sister   ? Stroke Sister   ? Asthma Brother   ? Depression Brother   ? Learning disabilities Brother   ? Irritable bowel syndrome Brother   ? Depression Maternal Aunt   ? Diabetes Maternal Aunt   ? Mental illness Maternal Aunt   ? Diabetes Maternal Uncle   ? Kidney disease Maternal Uncle   ? Arthritis Maternal Grandmother   ? Diabetes Maternal Grandmother   ?  Heart disease Maternal Grandmother   ? Kidney disease Maternal Grandmother   ? Hypertension Maternal Grandmother   ? Hyperlipidemia Maternal Grandmother   ? Cervical cancer Maternal Grandmother   ? Stroke Maternal Grandfather   ? Cancer Other   ? COPD Other   ? Heart disease Other   ? Hypertension Other   ? Obesity Father   ? Heart attack Father   ? Heart disease Paternal Grandmother   ? Hypertension Paternal Grandmother   ? Colon cancer Neg Hx   ? Esophageal cancer Neg Hx   ? Pancreatic cancer Neg Hx   ? Stomach cancer Neg Hx   ? ? ?Social History ?Social History  ? ?Tobacco Use  ? Smoking status: Never  ?  Passive exposure: Yes  ? Smokeless tobacco: Never  ? Tobacco comments:  ?  mom stopped smoking  ?Vaping Use  ? Vaping Use: Never used  ?Substance Use Topics  ? Alcohol use: No  ? Drug use: No  ? ? ? ?Allergies   ?Shellfish allergy ? ? ?Review of Systems ?Review of Systems ?Per HPI ? ?Physical Exam ?Triage Vital Signs ?ED Triage Vitals  ?Enc Vitals Group  ?   BP 05/01/22 1811 129/63  ?   Pulse Rate 05/01/22 1811 97  ?   Resp 05/01/22 1811 18  ?   Temp 05/01/22 1811 99.6 ?F (37.6 ?C)  ?   Temp Source 05/01/22 1811 Oral  ?   SpO2 05/01/22 1811 100 %  ?   Weight --   ?   Height --   ?   Head Circumference --   ?   Peak Flow --   ?   Pain Score 05/01/22 1813 8  ?   Pain Loc  --   ?   Pain Edu? --   ?   Excl. in GC? --   ? ?No data found. ? ?Updated Vital Signs ?BP 129/63 (BP Location: Left Arm)   Pulse 97   Temp 99.6 ?F (37.6 ?C) (Oral)   Resp 18   SpO2 100%  ? ?Visual Acuity ?Ri

## 2022-05-02 ENCOUNTER — Ambulatory Visit: Payer: Medicaid Other

## 2022-05-02 NOTE — Telephone Encounter (Signed)
Message received from [place]. ?"This is done! Just needed to verify address! Thanks!" ?

## 2022-05-02 NOTE — Telephone Encounter (Signed)
Patient calls nurse line reporting she still has not received walker.  ? ?I sent another community message to adapt checking on status.  ? ?Will await response ?

## 2022-05-03 ENCOUNTER — Other Ambulatory Visit (HOSPITAL_COMMUNITY): Payer: Self-pay

## 2022-05-03 ENCOUNTER — Ambulatory Visit (INDEPENDENT_AMBULATORY_CARE_PROVIDER_SITE_OTHER): Payer: Medicaid Other | Admitting: Family Medicine

## 2022-05-03 ENCOUNTER — Telehealth (INDEPENDENT_AMBULATORY_CARE_PROVIDER_SITE_OTHER): Payer: Medicaid Other | Admitting: Psychiatry

## 2022-05-03 ENCOUNTER — Ambulatory Visit: Payer: Medicaid Other | Admitting: Physical Therapy

## 2022-05-03 VITALS — BP 120/80 | HR 82 | Temp 98.4°F | Ht 60.0 in | Wt 250.2 lb

## 2022-05-03 DIAGNOSIS — R1031 Right lower quadrant pain: Secondary | ICD-10-CM | POA: Diagnosis not present

## 2022-05-03 DIAGNOSIS — F315 Bipolar disorder, current episode depressed, severe, with psychotic features: Secondary | ICD-10-CM | POA: Diagnosis not present

## 2022-05-03 DIAGNOSIS — R109 Unspecified abdominal pain: Secondary | ICD-10-CM | POA: Diagnosis not present

## 2022-05-03 DIAGNOSIS — F331 Major depressive disorder, recurrent, moderate: Secondary | ICD-10-CM | POA: Diagnosis not present

## 2022-05-03 DIAGNOSIS — F411 Generalized anxiety disorder: Secondary | ICD-10-CM | POA: Diagnosis not present

## 2022-05-03 LAB — POCT URINE PREGNANCY: Preg Test, Ur: NEGATIVE

## 2022-05-03 MED ORDER — RISPERIDONE 0.5 MG PO TABS
0.5000 mg | ORAL_TABLET | Freq: Two times a day (BID) | ORAL | 0 refills | Status: DC
  Filled 2022-05-03: qty 60, 30d supply, fill #0

## 2022-05-03 NOTE — Patient Instructions (Addendum)
It was wonderful to see you today. ? ?Please bring ALL of your medications with you to every visit.  ? ?Today we talked about: ? ?- Ruling out appendicitis or cholecystitis with abdominal imaging.  We will call you to schedule this. I will call or communicate with you via MyChart when results are available. ?- If you continue to have vomiting and increase in belly pain please go to the ED ?- Follow-up with psychiatry today ?- Follow-up with your primary care doctor in 1 month ? ?Please be sure to schedule follow up at the front  desk before you leave today.  ? ? ?Please call the clinic at [phone] if your symptoms worsen or you have any concerns. It was our pleasure to serve you. ? ?Dr. Salvadore Dom ? ?

## 2022-05-03 NOTE — Assessment & Plan Note (Signed)
PHQ9 27. Positive #9, this is the same as prior visits. She does self-harm through cutting. She denies a current plan to hurt herself or a plan to end her life. She has stopped taking her SSRI as she did not believe it was working. She is established with [facility] and has an upcoming appointment with a psych NP today at 5pm. ?

## 2022-05-03 NOTE — Assessment & Plan Note (Signed)
>>  ASSESSMENT AND PLAN FOR MAJOR DEPRESSION WRITTEN ON 05/03/2022  4:45 PM BY JAFFE, ELANA, MEDICAL STUDENT  PHQ9 27. Positive #9, this is the same as prior visits. She does self-harm through cutting. She denies a current plan to hurt herself or a plan to end her life. She has stopped taking her SSRI as she did not believe it was working. She is established with [facility] and has an upcoming appointment with a psych NP today at 5pm.

## 2022-05-03 NOTE — Progress Notes (Signed)
? ? ?SUBJECTIVE:  ? ?CHIEF COMPLAINT / HPI: Nausea/vomiting/stomach pain.  ? ?Sherri [name] has had 3 days of increased nausea, vomiting and diarrhea. This is an acute on chronic issue. She has had presumptive IBS-D since 2020. Since March of 2023, she has vomited around twice a day, it is worse with foods like beef and pork, but occasionally she will throw up with just water. The vomiting is associated with diffuse and painful abdominal cramping. Sometimes she throws up immediately after eating, and sometimes it is as long as 30 minutes after eating. It does not occur when she does not eat or drink. She denies hematemesis.  ? ?She was started on Ozempic in October or November of 2022, and took it for 3 months without side effects. The nausea/daily vomiting began in March, and later that month she stopped the Ozempic but the nausea and vomiting continued. She has been taking Metformin since 2020, and has not experienced any side effects with it.  ? ?She takes zofran and phenergan for her nausea, but over the past 3 days, neither has helped. She presented to urgent care for the nausea and vomiting on 05/01/22, and received intramuscular zofran, which she stated helped only minimally. She was told at urgent care to go to the hospital if the pain and vomiting continued, but she did not go to the ED because the family does not have a car and she did not want to walk to the ED due to leg pain.  ? ?She denies fever, chills, and burning with urination. ? ?She endorses chronic depression that she feels is getting worse. She has tried several antidepressants including fluoxetine, sertraline, amitriptyline, and duloxetine. She had undesirable side effects or no therapeutic effect with each one. She most recently was started on escitalopram at the beginning of 2023, but stopped taking it as she did not feel it was working for her. She endorses self-harm with cutting and states that harm reduction methods such as snapping a rubber  band are ineffective. She is established with [facility], and she has a Glass blower/designer tonight with a psych NP at [place]. Her mom is aware of her self-harming behavior. She denies active SI and has no plans to end her life. ? ?PERTINENT  PMH / PSH:  ?- IBS-D ?- MDD ? ?OBJECTIVE:  ? ?BP 120/80   Pulse 82   Temp 98.4 ?F (36.9 ?C)   Ht 5' (1.524 m)   Wt 250 lb 3.2 oz (113.5 kg)   SpO2 98%   BMI 48.86 kg/m?   ? ?Gen: well-appearing, ambulating with a cane ?HEENT: NCAT ?Lungs: No increased work of breathing ?Abdomen: soft, diffusely tender to palpation with most significant tenderness in RLQ and epigastric area, rebound tenderness with palpation in right lower quadrant. Psoas sign positive.  ? ? ?ASSESSMENT/PLAN:  ? ?RLQ pain:  ?- Given RLQ pain, rebound tenderness, and positive psoas sign, ordered abdominal CT to rule out appendicitis.Advised to go immediately to ED if the pain and vomiting increase. She also previously had mobile gallstones on ultrasound, and given the intermittent and postprandial course of her symptoms, CT will be useful to rule out symptomatic cholelithiasis as well. We also ran a pregnancy test to rule out ectopic pregnancy given she has a nexplanon. Given chronicity of nausea and vomiting, advised her to follow up with the GI she is established with to discuss if she continues to experience multiple episodes of emesis a day. ? ?Major depression ?PHQ9 27. Positive #  9, this is the same as prior visits. She does self-harm through cutting. She denies a current plan to hurt herself or a plan to end her life. She has stopped taking her SSRI as she did not believe it was working. She is established with [facility] and has an upcoming appointment with a psych NP today at [place]. ?Passive SI. Denies active SI.  ? ? ?Annia Belt, Medical Student ?[place]   ? ?I was personally present and re-performed the exam and MDM and  verified the service and findings are accurately documented in the student's note. ?[place], DO ?05/03/22 ?6:03 PM;ED ?

## 2022-05-03 NOTE — Progress Notes (Signed)
[place] MD/PA/NP OP Progress Note  05/03/2022 4:53 PM Sherri [name]  MRN:  [id number]  Virtual Visit via Telephone Note  I connected with Sherri [name] on 05/03/22 at  5:00 PM EDT by telephone and verified that I am speaking with the correct person using two identifiers.  Location: Patient: home Provider: offsite   I discussed the limitations, risks, security and privacy concerns of performing an evaluation and management service by telephone and the availability of in person appointments. I also discussed with the patient that there may be a patient responsible charge related to this service. The patient expressed understanding and agreed to proceed.     I discussed the assessment and treatment plan with the patient. The patient was provided an opportunity to ask questions and all were answered. The patient agreed with the plan and demonstrated an understanding of the instructions.   The patient was advised to call back or seek an in-person evaluation if the symptoms worsen or if the condition fails to improve as anticipated.  I provided 20 minutes of non-face-to-face time during this encounter.   Sherri Grip, NP   Chief Complaint: Medication management  HPI: Sherri [name] is a 21 year old female presented to [facility] behavioral health outpatient for follow-up psychiatric evaluation.  Patient is being seen by this provider as her attending psychiatric provider is unavailable to complete today's appointment.  Patient has a psychiatric history of bipolar disorder with psychotic features and generalized anxiety disorder.  Her symptoms are managed with Lybalvi 10-10 mg nightly, hydroxyzine 10 mg three times daily as needed for anxiety, melatonin 3 mg at bedtime, and trazodone 50 mg at bedtime.  Patient reports that she stopped taking her Lybalvi and hydroxyzine due to ineffectiveness.  Patient is not open to dosage adjustments of those medications today and request to discontinue  both medications. Lybalvi and hydroxyzine discontinued today.  Patient reports that she has been stealing recently and hearing voices for the past 2 months, stating "hurt  yourself" or "take medications I shouldn't take".  Patient reports a recent urge to hurt herself more, stating that she scratches herself when overwhelmed.  Patient states that she has health issues and she is always sick and she is tired of being sick as to the reason why she becomes overwhelmed with urges to scratch herself.  Patient denies suicidal ideations today.  Her mother has removed all harmful objects to decrease the likelihood of patient causing severe self-harm.  Patient reports a history of also taking Zoloft, Prozac, Cymbalta, Lexapro and amitriptyline but all were ineffective or either caused adverse effects.    Visit Diagnosis:    ICD-10-CM   1. Bipolar disorder, current episode depressed, severe, with psychotic features ([place])  F31.5     2. Generalized anxiety disorder  F41.1       Past Psychiatric History: Bipolar disorder with psychotic features and generalized anxiety disorder   Past Medical History:  Past Medical History:  Diagnosis Date    Chronic pain 02/02/2020   Abnormal vision screen 08/26/2015   Acanthosis nigricans 02/26/2020   Acid reflux    Allergy    Seasonal allergies   Anxiety    Phreesia 11/24/2020   Asthma    Atopic dermatitis 07/14/2013   Carpal tunnel syndrome, bilateral 11/21/2018   S/p bilat surgery, release   Chest pain 07/16/2019   Concussion 02/16/2021   Approx 2/15 d/t hit posterior skull against wall during tic.    Constipation 06/17/2013   Cough 01/29/2021  COVID-19 01/22/2020   Eczema    Elevated blood pressure reading 02/26/2020   Gallstones    GERD (gastroesophageal reflux disease)    Phreesia 11/24/2020   Headache    Obesity    Pain of both hip joints 11/10/2019   PCOS (polycystic ovarian syndrome) 02/26/2020   Seasonal allergies    Tic     Past Surgical History:   Procedure Laterality Date   BILATERAL CARPAL TUNNEL RELEASE     CHOLECYSTECTOMY N/A 08/08/2017   Procedure: LAPAROSCOPIC CHOLECYSTECTOMY;  Surgeon: Kandice Hams, MD;  Location: [facility] OR;  Service: General;  Laterality: N/A;   UPPER GASTROINTESTINAL ENDOSCOPY      Family Psychiatric History: See below  Family History:  Family History  Problem Relation Age of Onset   Asthma Mother    Arthritis Mother    Depression Mother    Diabetes Mother    Hypertension Mother    Obesity Mother    Irritable bowel syndrome Mother    Asthma Sister    Learning disabilities Sister    Stroke Sister    Asthma Brother    Depression Brother    Learning disabilities Brother    Irritable bowel syndrome Brother    Depression Maternal Aunt    Diabetes Maternal Aunt    Mental illness Maternal Aunt    Diabetes Maternal Uncle    Kidney disease Maternal Uncle    Arthritis Maternal Grandmother    Diabetes Maternal Grandmother    Heart disease Maternal Grandmother    Kidney disease Maternal Grandmother    Hypertension Maternal Grandmother    Hyperlipidemia Maternal Grandmother    Cervical cancer Maternal Grandmother    Stroke Maternal Grandfather    Cancer Other    COPD Other    Heart disease Other    Hypertension Other    Obesity Father    Heart attack Father    Heart disease Paternal Grandmother    Hypertension Paternal Grandmother    Colon cancer Neg Hx    Esophageal cancer Neg Hx    Pancreatic cancer Neg Hx    Stomach cancer Neg Hx     Social History:  Social History   Socioeconomic History   Marital status: Single    Spouse name: Not on file   Number of children: 0   Years of education: 12   Highest education level: GED or equivalent  Occupational History   Occupation: Furniture conservator/restorer to college   Occupation: Seeking disability  Tobacco Use   Smoking status: Never    Passive exposure: Yes   Smokeless tobacco: Never   Tobacco comments:    mom stopped smoking  Vaping Use   Vaping  Use: Never used  Substance and Sexual Activity   Alcohol use: No   Drug use: No   Sexual activity: Never  Other Topics Concern   Not on file  Social History Narrative   Lives with Mom and some siblings, some of whom have children of their own, dropped out of [employer]. plans to start [employer] for GED.   Right-handed.   No daily use of caffeine.    Social Determinants of Health   Financial Resource Strain: Not on file  Food Insecurity: Not on file  Transportation Needs: Not on file  Physical Activity: Not on file  Stress: Not on file  Social Connections: Not on file    Allergies:  Allergies  Allergen Reactions   Shellfish Allergy Hives and Itching    Mouth itches, facial hives.  Metabolic Disorder Labs: Lab Results  Component Value Date   HGBA1C 5.3 12/07/2021   MPG 123 02/11/2020   MPG 126 07/18/2019   Lab Results  Component Value Date   PROLACTIN 12.2 07/18/2019   Lab Results  Component Value Date   CHOL 136 01/27/2021   TRIG 74 01/27/2021   HDL 42 01/27/2021   CHOLHDL 3.2 01/27/2021   VLDL 16 07/25/2013   LDLCALC 79 01/27/2021   LDLCALC 66 08/01/2018   Lab Results  Component Value Date   TSH 1.02 03/23/2022   TSH 0.60 04/18/2021    Therapeutic Level Labs: No results found for: LITHIUM No results found for: VALPROATE No components found for:  CBMZ  Current Medications: Current Outpatient Medications  Medication Sig Dispense Refill   albuterol (VENTOLIN HFA) 108 (90 Base) MCG/ACT inhaler Inhale 2 puffs into the lungs every 6 (six) hours as needed for wheezing or shortness of breath. 18 g 2   benzonatate (TESSALON) 200 MG capsule Take 1 capsule (200 mg total) by mouth 2 (two) times daily as needed for cough. (Patient not taking: Reported on 04/14/2022) 20 capsule 0   dicyclomine (BENTYL) 10 MG capsule Take 1 capsule (10 mg total) by mouth 2 (two) times daily as needed for spasms. 90 capsule 0   etonogestrel (NEXPLANON) 68 MG IMPL implant 1 each (68 mg  total) by Subdermal route once. 1 each 0   famotidine (PEPCID) 20 MG tablet Take 1 tablet (20 mg total) by mouth at bedtime. 90 tablet 1   FLOVENT HFA 110 MCG/ACT inhaler Inhale 2 puffs into the lungs 2 (two) times daily. (Patient not taking: Reported on 05/03/2022) 12 g 11   linaclotide (LINZESS) 72 MCG capsule Take 1 capsule (72 mcg total) by mouth daily before breakfast. 30 capsule 3   melatonin 3 MG TABS tablet Take 1 tablet (3 mg total) by mouth at bedtime. 30 tablet 2   metFORMIN (GLUCOPHAGE) 500 MG tablet Take 1 tablet (500 mg total) by mouth 2 (two) times daily with a meal. 180 tablet 1   nirmatrelvir & ritonavir (PAXLOVID, 300/100,) 20 x 150 MG & 10 x 100MG  TBPK Take 3 tablets by mouth 2 (two) times daily for 5 days (Patient not taking: Reported on 04/14/2022) 30 tablet 0   ondansetron (ZOFRAN) 8 MG tablet Take 0.5-1 tablets (4-8 mg total) by mouth every 6 (six) hours as needed for nausea & vomiting 60 tablet 1   pantoprazole (PROTONIX) 20 MG tablet Take 1 tablet (20 mg total) by mouth daily. 30 tablet 3   promethazine (PHENERGAN) 25 MG tablet Take 1 tablet (25 mg total) by mouth every 6 (six) hours as needed for nausea or vomiting. 30 tablet 0   Spacer/Aero Chamber Mouthpiece MISC 1 Device by Does not apply route as needed. (Patient not taking: Reported on 05/03/2022) 1 each 0   traZODone (DESYREL) 50 MG tablet Take 1 tablet (50 mg total) by mouth at bedtime. 30 tablet 3   triamcinolone cream in Minerin Creme Apply 1 application topically 2 (two) times daily. Do not use for more than 7 consecutive days 454 g 0   No current facility-administered medications for this visit.     Musculoskeletal: Strength & Muscle Tone: N/A virtual visit Gait & Station: N/A virtual visit Patient leans: N/A  Psychiatric Specialty Exam: Review of Systems  Psychiatric/Behavioral:  Positive for hallucinations and self-injury. Negative for sleep disturbance and suicidal ideas.   All other systems reviewed and  are negative.  There  were no vitals taken for this visit.There is no height or weight on file to calculate BMI.  General Appearance: NA  Eye Contact:  NA  Speech:  Clear and Coherent  Volume:  Normal  Mood:  Anxious  Affect:  NA  Thought Process:  Goal Directed  Orientation:  Full (Time, Place, and Person)  Thought Content: Logical   Suicidal Thoughts:  No  Homicidal Thoughts:  No  Memory: Good  Judgement:  Fair  Insight:  Good  Psychomotor Activity:  NA  Concentration: Good  Recall:  Good  Fund of Knowledge: Good  Language: Good  Akathisia:  NA  Handed:  Right  AIMS (if indicated): not done  Assets:  Communication Skills Desire for Improvement Housing Social Support  ADL's:  Intact  Cognition: WNL  Sleep:  Good   Screenings: GAD-7    Flowsheet Row Clinical Support from 01/30/2022 in [facility] Office Visit from 11/03/2021 in [facility] Office Visit from 06/08/2020 in [facility] Video Visit from 02/10/2020 in [place] [facility] [place] [facility] [place]  Total GAD-7 Score 19 18 12 21       PHQ2-9    [place] Office Visit from 05/03/2022 in [place] Office Visit from 04/11/2022 in [place] from 01/30/2022 in [facility] Office Visit from 12/07/2021 in [place] Office Visit from 11/03/2021 in [place]  PHQ-2 Total Score 6 4 6 4 6   PHQ-9 Total Score 27 21 25 18 25       [place] ED from 05/01/2022 in [facility] ED from 04/03/2022 in [place] DEPT Clinical Support from 01/30/2022 in [place] No Risk No Risk Error: Q7 should not be populated when Q6 is No        Assessment and  Plan: Sherri [name] is a 21 year old female presented to [facility] behavioral health outpatient for follow-up psychiatric evaluation.  Patient is being seen by this provider as her attending psychiatric provider is unavailable to complete today's appointment.  Patient has a psychiatric history of bipolar disorder with psychotic features and generalized anxiety disorder.  Her symptoms are managed with Lybalvi 10-10 mg nightly, hydroxyzine 10 mg three times daily as needed for anxiety, melatonin 3 mg at bedtime, and trazodone 50 mg at bedtime.  Patient reports that she stopped taking her Lybalvi and hydroxyzine due to ineffectiveness.  Patient is not open to dosage adjustments of those medications today and request to discontinue both medications. Lybalvi and hydroxyzine discontinued today.  Patient is agreeable to starting Risperdal 0.5 mg twice daily for symptom management.  Medication benefits versus risk discussed.  Collaboration of Care: Collaboration of Care: Medication Management AEB medication E scribed to patient's preferred pharmacy.  1. Bipolar disorder, current episode depressed, severe, with psychotic features ([place])  - risperiDONE (RISPERDAL) 0.5 MG tablet; Take 1 tablet (0.5 mg total) by mouth 2 (two) times daily.  Dispense: 60 tablet; Refill: 0    Return to care in 4 weeks Follow-up with therapy recommendation   Patient/Guardian was advised Release of Information must be obtained prior to any record release in order to collaborate their care with an outside provider. Patient/Guardian was advised if they have not already done so to contact the registration department to  sign all necessary forms in order for [place] to release information regarding their care.   Consent: Patient/Guardian gives verbal consent for treatment and assignment of benefits for services provided during this visit. Patient/Guardian expressed understanding and agreed to proceed.    Sherri Grip, NP 05/03/2022,  4:53 PM

## 2022-05-03 NOTE — Progress Notes (Signed)
I was personally present and re-performed the exam and MDM and verified the service and findings are accurately documented in the student's note. ?Shamarion Coots Autry-Lott, DO ?05/03/22 ?6:03 PM ? ?

## 2022-05-04 ENCOUNTER — Other Ambulatory Visit (HOSPITAL_COMMUNITY): Payer: Self-pay

## 2022-05-05 ENCOUNTER — Ambulatory Visit: Payer: Medicaid Other

## 2022-05-05 DIAGNOSIS — R293 Abnormal posture: Secondary | ICD-10-CM

## 2022-05-05 DIAGNOSIS — R262 Difficulty in walking, not elsewhere classified: Secondary | ICD-10-CM

## 2022-05-05 DIAGNOSIS — M6281 Muscle weakness (generalized): Secondary | ICD-10-CM | POA: Diagnosis not present

## 2022-05-05 NOTE — Therapy (Signed)
OUTPATIENT PHYSICAL THERAPY TREATMENT NOTE   Patient Name: Sherri [name] MRN: [id number] DOB:2001/01/13, 21 y.o., female Today's Date: 05/05/2022   END OF SESSION:   PT End of Session - 05/05/22 1306     Visit Number 4    Number of Visits 27    Date for PT Re-Evaluation 05/12/22    Authorization Type [employer]    PT Start Time [id number]    PT Stop Time 1345    PT Time Calculation (min) 42 min    Activity Tolerance Patient tolerated treatment well    Behavior During Therapy [facility] for tasks assessed/performed               Past Medical History:  Diagnosis Date    Chronic pain 02/02/2020   Abnormal vision screen 08/26/2015   Acanthosis nigricans 02/26/2020   Acid reflux    Allergy    Seasonal allergies   Anxiety    Phreesia 11/24/2020   Asthma    Atopic dermatitis 07/14/2013   Carpal tunnel syndrome, bilateral 11/21/2018   S/p bilat surgery, release   Chest pain 07/16/2019   Concussion 02/16/2021   Approx 2/15 d/t hit posterior skull against wall during tic.    Constipation 06/17/2013   Cough 01/29/2021   COVID-19 01/22/2020   Eczema    Elevated blood pressure reading 02/26/2020   Gallstones    GERD (gastroesophageal reflux disease)    Phreesia 11/24/2020   Headache    Obesity    Pain of both hip joints 11/10/2019   PCOS (polycystic ovarian syndrome) 02/26/2020   Seasonal allergies    Tic    Past Surgical History:  Procedure Laterality Date   BILATERAL CARPAL TUNNEL RELEASE     CHOLECYSTECTOMY N/A 08/08/2017   Procedure: LAPAROSCOPIC CHOLECYSTECTOMY;  Surgeon: Stanford Scotland, MD;  Location: [facility];  Service: General;  Laterality: N/A;   UPPER GASTROINTESTINAL ENDOSCOPY     Patient Active Problem List   Diagnosis Date Noted   Gait disturbance 04/23/2022   Lower extremity weakness 04/13/2022   Major depression 10/07/2021   Abnormal uterine bleeding (AUB) 05/12/2021   Movement disorder 04/14/2021   Macromastia 03/16/2021   Thoracic back pain 03/16/2021   Poor  appetite 03/16/2021   Hand pain, not arthralgia 02/16/2021   Healthcare maintenance 01/29/2021   Tic disorder, unspecified 08/10/2020   Hirsutism 02/26/2020   Migraine without aura and without status migrainosus, not intractable 02/10/2020   Moderate persistent asthma 02/10/2020   Irritable bowel syndrome 02/10/2020   Chronic nonintractable headache 02/02/2020   Pre-diabetes 07/16/2019   Breast pain 07/16/2019   Sleeping difficulty 07/16/2019   Menorrhagia with irregular cycle 07/16/2019   Chronic low back pain without sciatica 04/26/2017   Gastroesophageal reflux  07/29/2015   Vitamin D deficiency 07/29/2013   Morbid obesity ([place]) 07/29/2013   Asthma, chronic 07/14/2013   Allergic rhinitis 07/14/2013   PCP: Dr. Ezequiel Essex, MD   REFERRING PROVIDER: Dr. Dorcas Mcmurray, MD   REFERRING DIAG: R53.1 (ICD-10-CM) - Weakness  THERAPY DIAG:  Muscle weakness (generalized)  Difficulty in walking, not elsewhere classified  Abnormal posture  PERTINENT HISTORY: Covid April 2023   PRECAUTIONS: none  SUBJECTIVE: Pt talked to the doctor yesterday and was told they forgot to put the order in and would do it that day. Pt will call back today to check. She collapsed off her bed after last visit due to fatigue, but has not fallen since. She wants to hurry up and get better because her  mom is getting her a cat. "It's getting easier to walk up and down the stairs. Some days I don't have pain."  PAIN:  Are you having pain? Yes: NPRS scale: 5/10, up to 10/10 Pain location: thighs, knees, ankles Pain description: achy in thighs/knees, sharp in ankles Aggravating factors: walking Relieving factors: resting   OBJECTIVE: (objective measures completed at initial evaluation unless otherwise dated)   OBJECTIVE:    DIAGNOSTIC FINDINGS: BLE weakness R>L   PATIENT SURVEYS:      COGNITION:           Overall cognitive status:  history of bipolar                                SENSATION: WFL    MUSCLE LENGTH: Hamstrings: Right NT deg; Left NT deg Thomas test: Right NT deg; Left NT deg   POSTURE:  Forward flexed posture, knees hyperextended   PALPATION: Tapping for quad activation   LE ROM: [facility]   Active ROM Right 04/14/2022 Left 04/14/2022  Hip flexion      Hip extension      Hip abduction      Hip adduction      Hip internal rotation      Hip external rotation      Knee flexion      Knee extension      Ankle dorsiflexion      Ankle plantarflexion      Ankle inversion      Ankle eversion       (Blank rows = not tested)   LE MMT:   MMT Right 04/14/2022 Left 04/14/2022  Hip flexion      Hip extension      Hip abduction      Hip adduction      Hip internal rotation      Hip external rotation      Knee flexion 3+/5 4-/5  Knee extension 4-/5 4/5  Ankle dorsiflexion 3+/5 4-/5  Ankle plantarflexion 3+/5 4-/5  Ankle inversion      Ankle eversion       (Blank rows = not tested)   LOWER EXTREMITY SPECIAL TESTS:  STS: instructed on how to use RW, pushing off of mat with L UE and using R UE on RW   FUNCTIONAL TESTS:  Stairs: 4 steps with MIN/MOD A with R HR, instructed on nonreciprocal gait ascending leading with LLE and descending with RLE   GAIT: Distance walked: 8 ft  Assistive device utilized: Walker - 2 wheeled Level of assistance: Mod A followed with WC Comments: Verbal cues for step to gait moving RW first, BLE shaky and difficult to achieve and maintain TKE, forward flexed posture and downward gaze       TODAY'S TREATMENT:  [facility] Adult PT Treatment:                                                DATE: 05/05/22 Therapeutic Exercise: Seated LAQ with inner thigh ADD 3# ankle weights x30 alternating Supine Bridge with ball ADD (staggered with R foot closer and cues for R hip elevation due to L side working harder) x12 S/L Clams with RTB x15 B 3 Way SLR x10 B Eccentric Sit to Stands with RW  Gait: Pre-gait in RW: weight shifts AP,  ML Tandem Stance  2x20" B in RW Practiced cane on L side to assist in weaker R side  Self Care: Continue HEP, added red theraband to clams, 3 way SLR, focusing on TKE with R LE during gait with cane on L side   [facility] Adult PT Treatment:                                                DATE: 04/28/22 Therapeutic Exercise: Seated LAQ x 10 Seated MARCH x 10 Seated heel toe raises 2 x 10 Seated SLR 2 x 5 each  Supine SLR 2 x 8 each Bridge with ball b/t knees 2 x 10 S/L Clam x10 each Gait :  110 ft with RW and close SBA from therapist to prevent fall at start of session, pt demo large step lengths and lacks TKE --> Vcs to shorten step lengths and rely on UE for upright posture versus forward lean 110 ft with RW and supervision with pt demo step to gait L to R and excellent use or RW with no buckling noted. (Used clinic RW on the way out of clinic) Self Care: Continue HEP, discussed form  [facility] Adult PT Treatment:                                                DATE: 04/26/22 Therapeutic Exercise: Seated LAQ x 10- cues for DF Seated MARCH x 10 Seated heel toe raises 10 x 2  Supine heel slides x 5 each SLR x 5 each , 15 degree qad lag bilat Side clam x 10 each  Bridge 10 x 2  Gait :  110 ft with SPC and HHA from therapist to prevent fall at start of session 52ft , 53ft with RW and CGA at end of session. (Used clinic RW on the way out of clinic)  INITIAL TREATMENT: See HEP     PATIENT EDUCATION:  Education details: diagnosis, prognosis, call and schedule with PCP, HEP, POC Person educated: Patient Education method: Explanation, Demonstration, Tactile cues, Verbal cues, and Handouts Education comprehension: verbalized understanding, returned demonstration, verbal cues required, and tactile cues required     HOME EXERCISE PROGRAM: Access Code: [email or web address] URL: [email or web address] Date: 04/26/2022 Prepared by: Hessie Diener  Exercises - Stair Negotiation with Single Rail Using Step-To  Pattern (One Step at a Time)  - 1 x daily - 7 x weekly - Supine Heel Slide  - 1-2 x daily - 7 x weekly - 1-2 sets - 5 reps - Supine Knee Extension Strengthening  - 1-2 x daily - 7 x weekly - 1-2 sets - 5 reps - Supine Short Arc Quad  - 1-2 x daily - 7 x weekly - 1-2 sets - 5 reps - Seated Long Arc Quad  - 1-2 x daily - 7 x weekly - 1-2 sets - 5 reps - Seated Heel Toe Raises  - 1 x daily - 7 x weekly - 3 sets - 10 reps - Supine Bridge  - 1 x daily - 7 x weekly - 3 sets - 10 reps - Beginner Clam  - 1 x daily - 7 x weekly - 3 sets - 10 reps - Small Range Straight Leg Raise  -  1 x daily - 7 x weekly - 1 sets - 10 reps  ASSESSMENT:   CLINICAL IMPRESSION: Patient arrives with [facility] again, verbalizing her RW order was called in and is en route to her house, but she does not have it yet. Pt is doing better and is able to negotiate stairs better, but she continues to demonstrate weakness in R LE. She has difficulty with eccentric R quad control during stance phase and achieving TKE. Added 3 way SLR to program today as well as pre gait activities, to focus on sustained quad control to prevent buckling of knee. She would continue to benefit from skilled PT to progress BLE and core strength/endurance for increased safety during gait and transfers, and return to PLOF.    EVAL: Pt began experiencing weakness following Covid diagnosis 2 weeks ago and is now having difficulty walking. She fell down the stairs last week from her legs buckling. Her mother is currently helping her negotiate stairs to reach her bedroom and bathroom. She demonstrates impairments in balance, LE strength and endurance, antalgic gait, and difficulty with stairs. She was educated on diagnosis, prognosis, POC, and HEP verbalizing understanding and consent to tx. She would benefit from skilled PT 2x/week for 4-6 weeks to address aforementioned impairments and return to PLOF.      OBJECTIVE IMPAIRMENTS decreased activity tolerance, decreased  balance, decreased endurance, decreased mobility, difficulty walking, decreased strength, impaired perceived functional ability, improper body mechanics, and pain.    ACTIVITY LIMITATIONS cleaning, community activity, and meal prep.    PERSONAL FACTORS 3+ comorbidities: anxiety, asthma, GERD, nerve/muscle disease  are also affecting patient's functional outcome.      REHAB POTENTIAL: Good   CLINICAL DECISION MAKING: Evolving/moderate complexity   EVALUATION COMPLEXITY: Moderate     GOALS: Goals reviewed with patient? No   SHORT TERM GOALS: Target date: 05/05/2022   Pt will be I and compliant with initial HEP. Baseline: provided at eval Goal status: INITIAL     LONG TERM GOALS: Target date: 06/09/2022   Pt will be independent with advanced HEP for continued strength and endurance on her own. Baseline: not provided yet. Goal status: INITIAL   2.  Pt will demonstrate 4+/5 BLE strength for improved ability to stand and ambulate without buckling for reduced fall risk. Baseline: 3+ 5 to 4-/5 Goal status: INITIAL   3.  Pt will ambulate 50 feet without AD and modified independent for increased independence. Baseline: 8 feet MOD A with RW and WC follow Goal status: INITIAL   4.  Pt will negotiate 13+ stairs with 0-1 HR and nonreciprocal gait, in order to safely access bedroom and bathroom at home. Baseline: R HR with B UE, nonreciprocal pattern, MOD A Goal status: INITIAL         PLAN: PT FREQUENCY: 1-2x/week   PT DURATION: 6 weeks   PLANNED INTERVENTIONS: Therapeutic exercises, Therapeutic activity, Neuromuscular re-education, Balance training, Gait training, Patient/Family education, Joint mobilization, Stair training, DME instructions, Dry Needling, Electrical stimulation, Wheelchair mobility training, Cryotherapy, Moist heat, Taping, Vasopneumatic device, Ionotophoresis 4mg /ml Dexamethasone, and Manual therapy   PLAN FOR NEXT SESSION: assess response to HEP/update PRN,  continue to strengthen BLE, gait training, did she get RW?      Izell Weston, PT, DPT 05/05/2022, 1:53 PM

## 2022-05-09 ENCOUNTER — Other Ambulatory Visit: Payer: Self-pay

## 2022-05-09 ENCOUNTER — Other Ambulatory Visit (HOSPITAL_COMMUNITY): Payer: Self-pay

## 2022-05-09 ENCOUNTER — Ambulatory Visit (INDEPENDENT_AMBULATORY_CARE_PROVIDER_SITE_OTHER): Payer: Medicaid Other | Admitting: Family Medicine

## 2022-05-09 ENCOUNTER — Encounter: Payer: Self-pay | Admitting: Family Medicine

## 2022-05-09 VITALS — HR 91 | Wt 251.4 lb

## 2022-05-09 DIAGNOSIS — B349 Viral infection, unspecified: Secondary | ICD-10-CM | POA: Diagnosis not present

## 2022-05-09 DIAGNOSIS — F339 Major depressive disorder, recurrent, unspecified: Secondary | ICD-10-CM | POA: Diagnosis not present

## 2022-05-09 DIAGNOSIS — F959 Tic disorder, unspecified: Secondary | ICD-10-CM

## 2022-05-09 MED ORDER — CETIRIZINE HCL 10 MG PO TABS
10.0000 mg | ORAL_TABLET | Freq: Every day | ORAL | 11 refills | Status: DC
  Filled 2022-05-09 – 2022-05-29 (×3): qty 30, 30d supply, fill #0
  Filled 2022-06-26: qty 30, 30d supply, fill #1
  Filled 2022-07-25: qty 30, 30d supply, fill #2

## 2022-05-09 NOTE — Progress Notes (Signed)
     SUBJECTIVE:   CHIEF COMPLAINT / HPI:   Sherri [name] is a 21 y.o. female presents for follow up[    Depression Anxiety Pt reports her mental health has worsened recently. Her has been self harming on her wrists, thighs and shoulders. She is seeing psychiatry and started risperidone earlier this week. Has been on SSRIs and SNRIs in the past, she was not able to tolerate them. Denies suicidal attempt or thoughts but has thoughts related to self harm. Next f/u with psychiatry is in June 19th. Has therapy every Friday.   Viral infections  Gets sick with viral infections since Sept 22 every month. Lives with mom and sisters. Goes to the store sometimes, therapy and doctors. Takes albuterol for her asthma. Supposed to take flovent but it does not. Has cough, runny nose, headache etc. Fully recovers and it happens again. She had COVID last month. She has young nephews and nieces that come over on the weekend.   Flowsheet Row Office Visit from 05/09/2022 in [facility]  PHQ-9 Total Score 27       PERTINENT  PMH / PSH: AUB, headaches, obesity  OBJECTIVE:   Pulse 91   Wt 251 lb 6.4 oz (114 kg)   SpO2 100%   BMI 49.10 kg/m    General: Alert, no acute distress Cardio: Normal S1 and S2, RRR, no r/m/g Pulm: CTAB, normal work of breathing Abdomen: Bowel sounds normal. Abdomen soft and non-tender.  Extremities: No peripheral edema.  Neuro: Cranial nerves grossly intact   ASSESSMENT/PLAN:   Recurrent viral infection Recurrent viral infections one per month.  No hx of immunological disorders. Likely in setting of seasonal weather changes and also being around her young nieces and nephews who are at school and daycare. Recommended a healthy diet full of fruits and vegetables, multivitamins, stress management, adequate sleep, hand washing and wearing a mask around sick people. F/u with PCP as needed.   Depression, recurrent (HCC) Positive question 9-related to  thoughts of self harm not suicide. Recently started on risperidone. Follow up with psychiatry. Suicide hot line number provided.    Towanda Octave, MD PGY-3 [facility]

## 2022-05-09 NOTE — Patient Instructions (Addendum)
Thank you for coming to see me today. It was a pleasure. Today we discussed your recurrent infections. I recommend: -restarting flovent -multivitamins -healthy diet full of fruit and vegetables -washing hands -stay away from sick people -wearing a mask   If you are feeling suicidal or depression symptoms worsen please immediately go to:   If you are thinking about harming yourself or having thoughts of suicide, or if you know someone who is, seek help right away. If you are in crisis, make sure you are not left alone.  If someone else is in crisis, make sure he/she/they is not left alone  Call 988 OR 1-800-273-TALK  24 Hour Availability for Walk-IN services  [facility]  [address] [place], [place] [phone] Crisis [phone] Please follow-up with PCP as needed   If you have any questions or concerns, please do not hesitate to call the office at [phone].  Best wishes,   Dr Allena Katz

## 2022-05-10 ENCOUNTER — Ambulatory Visit: Payer: Medicaid Other | Admitting: Physical Therapy

## 2022-05-10 ENCOUNTER — Encounter: Payer: Self-pay | Admitting: Physical Therapy

## 2022-05-10 DIAGNOSIS — M6281 Muscle weakness (generalized): Secondary | ICD-10-CM

## 2022-05-10 DIAGNOSIS — R293 Abnormal posture: Secondary | ICD-10-CM

## 2022-05-10 DIAGNOSIS — R262 Difficulty in walking, not elsewhere classified: Secondary | ICD-10-CM

## 2022-05-10 NOTE — Therapy (Addendum)
OUTPATIENT PHYSICAL THERAPY TREATMENT/DISCHARGE NOTE   Patient Name: Sherri [name] MRN: [id number] DOB:2001/04/21, 21 y.o., female Today's Date: 05/10/2022   END OF SESSION:   PT End of Session - 05/10/22 1238     Visit Number 5    Number of Visits 27    Date for PT Re-Evaluation 05/12/22    Authorization Type [employer]    Authorization Time Period 04/18/22-06/02/22    Authorization - Visit Number 4    Authorization - Number of Visits 12    PT Start Time 4827    PT Stop Time 0786    PT Time Calculation (min) 40 min               Past Medical History:  Diagnosis Date    Chronic pain 02/02/2020   Abnormal vision screen 08/26/2015   Acanthosis nigricans 02/26/2020   Acid reflux    Allergy    Seasonal allergies   Anxiety    Phreesia 11/24/2020   Asthma    Atopic dermatitis 07/14/2013   Carpal tunnel syndrome, bilateral 11/21/2018   S/p bilat surgery, release   Chest pain 07/16/2019   Concussion 02/16/2021   Approx 2/15 d/t hit posterior skull against wall during tic.    Constipation 06/17/2013   Cough 01/29/2021   COVID-19 01/22/2020   Eczema    Elevated blood pressure reading 02/26/2020   Gallstones    GERD (gastroesophageal reflux disease)    [employer]reesia 11/24/2020   Headache    Obesity    Pain of both hip joints 11/10/2019   PCOS (polycystic ovarian syndrome) 02/26/2020   Seasonal allergies    Tic    Past Surgical History:  Procedure Laterality Date   BILATERAL CARPAL TUNNEL RELEASE     CHOLECYSTECTOMY N/A 08/08/2017   Procedure: LAPAROSCOPIC CHOLECYSTECTOMY;  Surgeon: Stanford Scotland, MD;  Location: [facility] [facility];  Service: General;  Laterality: N/A;   UPPER GASTROINTESTINAL ENDOSCOPY     Patient Active Problem List   Diagnosis Date Noted   Gait disturbance 04/23/2022   Lower extremity weakness 04/13/2022   Major depression 10/07/2021   Abnormal uterine bleeding (AUB) 05/12/2021   Movement disorder 04/14/2021   Macromastia 03/16/2021   Thoracic back pain  03/16/2021   Poor appetite 03/16/2021   Hand pain, not arthralgia 02/16/2021   Healthcare maintenance 01/29/2021   Tic disorder, unspecified 08/10/2020   Hirsutism 02/26/2020   Migraine without aura and without status migrainosus, not intractable 02/10/2020   Moderate persistent asthma 02/10/2020   Irritable bowel syndrome 02/10/2020   Chronic nonintractable headache 02/02/2020   Pre-diabetes 07/16/2019   Breast pain 07/16/2019   Sleeping difficulty 07/16/2019   Menorrhagia with irregular cycle 07/16/2019   Chronic low back pain without sciatica 04/26/2017   Gastroesophageal reflux  07/29/2015   Vitamin D deficiency 07/29/2013   Morbid obesity ([place]) 07/29/2013   Asthma, chronic 07/14/2013   Allergic rhinitis 07/14/2013   PCP: Dr. Ezequiel Essex, MD   REFERRING PROVIDER: Dr. Dorcas Mcmurray, MD   REFERRING DIAG: R53.1 (ICD-10-CM) - Weakness  THERAPY DIAG:  Muscle weakness (generalized)  Difficulty in walking, not elsewhere classified  Abnormal posture  PERTINENT HISTORY: Covid April 2023   PRECAUTIONS: none  SUBJECTIVE: Left leg is better. It is fine. It does hurt in the ankle when I put weight on it. The left leg is not weak any more. The weakness in the right is better but still worse than the left. I still mostly use a cane but I have the  walker now. I can walk fine but I still have a limp with the cane. I use the walker if I am going somewhere. If I stand for awhile I start to lose feeling in both of my legs and I have to sit down. It happened when I was trying to cook eggs. I lasted 8 minutes at most.   PAIN:  Are you having pain? Yes: NPRS scale: 4/10 Pain location: left ankle 4/10 Pain description: achy in thighs/knees, sharp in ankles Aggravating factors: walking Relieving factors: resting   OBJECTIVE: (objective measures completed at initial evaluation unless otherwise dated)      DIAGNOSTIC FINDINGS: BLE weakness R>L   PATIENT SURVEYS:      COGNITION:            Overall cognitive status:  history of bipolar                                SENSATION: WFL   MUSCLE LENGTH: Hamstrings: Right NT deg; Left NT deg Thomas test: Right NT deg; Left NT deg   POSTURE:  Forward flexed posture, knees hyperextended   PALPATION: Tapping for quad activation   LE ROM: [facility]   Active ROM Right 04/14/2022 Left 04/14/2022  Hip flexion      Hip extension      Hip abduction      Hip adduction      Hip internal rotation      Hip external rotation      Knee flexion      Knee extension      Ankle dorsiflexion      Ankle plantarflexion      Ankle inversion      Ankle eversion       (Blank rows = not tested)   LE MMT:   MMT Right 04/14/2022 Left 04/14/2022  Hip flexion      Hip extension      Hip abduction      Hip adduction      Hip internal rotation      Hip external rotation      Knee flexion 3+/5 4-/5  Knee extension 4-/5 4/5  Ankle dorsiflexion 3+/5 4-/5  Ankle plantarflexion 3+/5 4-/5  Ankle inversion      Ankle eversion       (Blank rows = not tested)   LOWER EXTREMITY SPECIAL TESTS:  STS: instructed on how to use RW, pushing off of mat with L UE and using R UE on RW   FUNCTIONAL TESTS:  Stairs: 4 steps with MIN/MOD A with R HR, instructed on nonreciprocal gait ascending leading with LLE and descending with RLE   GAIT: Distance walked: 8 ft  Assistive device utilized: Walker - 2 wheeled Level of assistance: Mod A followed with WC Comments: Verbal cues for step to gait moving RW first, BLE shaky and difficult to achieve and maintain TKE, forward flexed posture and downward gaze       TODAY'S TREATMENT:  [facility] Adult PT Treatment:                                                DATE: 05/10/22 Gait: Gait with RW with cues to use UE to off set weight on RLE to allow normal gait pattern- much improved with cues- completed 1  lap in clinic Tandem stance 1 UE 30 sec each Standing lateral weight shifts 10 sec x 5 each side    Therapeutic Exercise: Sit-stand 10 x 2  Seated LAQ with inner thigh ADD ball  5# ankle weights x30 alternating Standing heel and toe raises 10 x 2  Supine Bridge with ball ADD x10 2 Way SLR x10 B   [facility] Adult PT Treatment:                                                DATE: 05/05/22 Therapeutic Exercise: Seated LAQ with inner thigh ADD 3# ankle weights x30 alternating Supine Bridge with ball ADD (staggered with R foot closer and cues for R hip elevation due to L side working harder) x12 S/L Clams with RTB x15 B 3 Way SLR x10 B Eccentric Sit to Stands with RW  Gait: Pre-gait in RW: weight shifts AP, ML Tandem Stance 2x20" B in RW Practiced cane on L side to assist in weaker R side  Self Care: Continue HEP, added red theraband to clams, 3 way SLR, focusing on TKE with R LE during gait with cane on L side   [facility] Adult PT Treatment:                                                DATE: 04/28/22 Therapeutic Exercise: Seated LAQ x 10 Seated MARCH x 10 Seated heel toe raises 2 x 10 Seated SLR 2 x 5 each  Supine SLR 2 x 8 each Bridge with ball b/t knees 2 x 10 S/L Clam x10 each Gait :  110 ft with RW and close SBA from therapist to prevent fall at start of session, pt demo large step lengths and lacks TKE --> Vcs to shorten step lengths and rely on UE for upright posture versus forward lean 110 ft with RW and supervision with pt demo step to gait L to R and excellent use or RW with no buckling noted. (Used clinic RW on the way out of clinic) Self Care: Continue HEP, discussed form  [facility] Adult PT Treatment:                                                DATE: 04/26/22 Therapeutic Exercise: Seated LAQ x 10- cues for DF Seated MARCH x 10 Seated heel toe raises 10 x 2  Supine heel slides x 5 each SLR x 5 each , 15 degree qad lag bilat Side clam x 10 each  Bridge 10 x 2  Gait :  110 ft with SPC and HHA from therapist to prevent fall at start of session 3f , 871fwith RW and  CGA at end of session. (Used clinic RW on the way out of clinic)  INITIAL TREATMENT: See HEP     PATIENT EDUCATION:  Education details: diagnosis, prognosis, call and schedule with PCP, HEP, POC Person educated: Patient Education method: Explanation, Demonstration, Tactile cues, Verbal cues, and Handouts Education comprehension: verbalized understanding, returned demonstration, verbal cues required, and tactile cues required     HOME EXERCISE PROGRAM: Access  Code: [email or web address] URL: [email or web address] Date: 04/26/2022 Prepared by: Hessie Diener  Exercises - Stair Negotiation with Single Rail Using Step-To Pattern (One Step at a Time)  - 1 x daily - 7 x weekly - Supine Heel Slide  - 1-2 x daily - 7 x weekly - 1-2 sets - 5 reps - Supine Knee Extension Strengthening  - 1-2 x daily - 7 x weekly - 1-2 sets - 5 reps - Supine Short Arc Quad  - 1-2 x daily - 7 x weekly - 1-2 sets - 5 reps - Seated Long Arc Quad  - 1-2 x daily - 7 x weekly - 1-2 sets - 5 reps - Seated Heel Toe Raises  - 1 x daily - 7 x weekly - 3 sets - 10 reps - Supine Bridge  - 1 x daily - 7 x weekly - 3 sets - 10 reps - Beginner Clam  - 1 x daily - 7 x weekly - 3 sets - 10 reps - Small Range Straight Leg Raise  - 1 x daily - 7 x weekly - 1 sets - 10 reps  ASSESSMENT:   CLINICAL IMPRESSION: Patient arrives with RW and reports she is only using it for long distances. Performed gait training in clinic with RW and cues to use UE to allow for proper gait pattern. She was able to demonstrate heel/ toe pattern. Educated on the need to walk with RW at least 3 x per day to work on her gait carryover and strengthening. Worked on lateral weight shifts with difficulty maintaining right TKE.  She demonstrates improved strength in LLE  however has increased left ankle pain with weightbearing. Continued with increased closed chain therex as pt now has RW. She would continue to benefit from skilled PT to progress BLE and core  strength/endurance for increased safety during gait and transfers, and return to PLOF.    EVAL: Pt began experiencing weakness following Covid diagnosis 2 weeks ago and is now having difficulty walking. She fell down the stairs last week from her legs buckling. Her mother is currently helping her negotiate stairs to reach her bedroom and bathroom. She demonstrates impairments in balance, LE strength and endurance, antalgic gait, and difficulty with stairs. She was educated on diagnosis, prognosis, POC, and HEP verbalizing understanding and consent to tx. She would benefit from skilled PT 2x/week for 4-6 weeks to address aforementioned impairments and return to PLOF.      OBJECTIVE IMPAIRMENTS decreased activity tolerance, decreased balance, decreased endurance, decreased mobility, difficulty walking, decreased strength, impaired perceived functional ability, improper body mechanics, and pain.    ACTIVITY LIMITATIONS cleaning, community activity, and meal prep.    PERSONAL FACTORS 3+ comorbidities: anxiety, asthma, GERD, nerve/muscle disease  are also affecting patient's functional outcome.      REHAB POTENTIAL: Good   CLINICAL DECISION MAKING: Evolving/moderate complexity   EVALUATION COMPLEXITY: Moderate     GOALS: Goals reviewed with patient? No   SHORT TERM GOALS: Target date: 05/05/2022   Pt will be I and compliant with initial HEP. Baseline: provided at eval Goal status: MET     LONG TERM GOALS: Target date: 06/09/2022   Pt will be independent with advanced HEP for continued strength and endurance on her own. Baseline: not provided yet. Goal status: INITIAL   2.  Pt will demonstrate 4+/5 BLE strength for improved ability to stand and ambulate without buckling for reduced fall risk. Baseline: 3+ 5 to 4-/5 Goal status: INITIAL   3.  Pt  will ambulate 50 feet without AD and modified independent for increased independence. Baseline: 8 feet MOD A with RW and WC follow Goal  status: INITIAL   4.  Pt will negotiate 13+ stairs with 0-1 HR and nonreciprocal gait, in order to safely access bedroom and bathroom at home. Baseline: R HR with B UE, nonreciprocal pattern, MOD A Goal status: INITIAL         PLAN: PT FREQUENCY: 1-2x/week   PT DURATION: 6 weeks   PLANNED INTERVENTIONS: Therapeutic exercises, Therapeutic activity, Neuromuscular re-education, Balance training, Gait training, Patient/Family education, Joint mobilization, Stair training, DME instructions, Dry Needling, Electrical stimulation, Wheelchair mobility training, Cryotherapy, Moist heat, Taping, Vasopneumatic device, Ionotophoresis 10m/ml Dexamethasone, and Manual therapy   PLAN FOR NEXT SESSION: assess response to HEP/update PRN, continue to strengthen BLE, gait training, has she been walking more with RW?      JHessie Diener PTA 05/10/22 1:24 PM Phone: [phone]: [phone]  PHYSICAL THERAPY DISCHARGE SUMMARY  Visits from Start of Care: 5  Current functional level related to goals / functional outcomes: Unknown   Remaining deficits: At time of last visit, pt was only able to stand for about 8 minutes before needing to sit down, limping during ambulation with cane, pain in BLE and back   Education / Equipment: HEP, theraband   Patient agrees to discharge. Patient goals were not met. Patient is being discharged due to not returning since the last visit.  KPhill Myron TYvette Rack PT, DPT

## 2022-05-12 ENCOUNTER — Ambulatory Visit: Payer: Medicaid Other

## 2022-05-15 DIAGNOSIS — B349 Viral infection, unspecified: Secondary | ICD-10-CM

## 2022-05-15 DIAGNOSIS — F339 Major depressive disorder, recurrent, unspecified: Secondary | ICD-10-CM | POA: Insufficient documentation

## 2022-05-15 HISTORY — DX: Viral infection, unspecified: B34.9

## 2022-05-15 NOTE — Assessment & Plan Note (Signed)
Positive question 9-related to thoughts of self harm not suicide. Recently started on risperidone. Follow up with psychiatry. Suicide hot line number provided.

## 2022-05-15 NOTE — Assessment & Plan Note (Signed)
Recurrent viral infections one per month.  No hx of immunological disorders. Likely in setting of seasonal weather changes and also being around her young nieces and nephews who are at school and daycare. Recommended a healthy diet full of fruits and vegetables, multivitamins, stress management, adequate sleep, hand washing and wearing a mask around sick people. F/u with PCP as needed.

## 2022-05-17 ENCOUNTER — Other Ambulatory Visit (HOSPITAL_COMMUNITY): Payer: Self-pay

## 2022-05-23 ENCOUNTER — Encounter: Payer: Self-pay | Admitting: *Deleted

## 2022-05-25 ENCOUNTER — Other Ambulatory Visit: Payer: Self-pay | Admitting: Family Medicine

## 2022-05-25 ENCOUNTER — Other Ambulatory Visit (HOSPITAL_COMMUNITY): Payer: Self-pay

## 2022-05-25 DIAGNOSIS — R7303 Prediabetes: Secondary | ICD-10-CM

## 2022-05-25 MED ORDER — METFORMIN HCL 500 MG PO TABS
500.0000 mg | ORAL_TABLET | Freq: Two times a day (BID) | ORAL | 1 refills | Status: DC
  Filled 2022-05-25 – 2022-06-19 (×3): qty 180, 90d supply, fill #0
  Filled 2022-10-24: qty 180, 90d supply, fill #1

## 2022-05-25 MED ORDER — PANTOPRAZOLE SODIUM 20 MG PO TBEC
20.0000 mg | DELAYED_RELEASE_TABLET | Freq: Every day | ORAL | 3 refills | Status: DC
  Filled 2022-05-25 – 2022-05-26 (×3): qty 30, 30d supply, fill #0
  Filled 2022-06-26: qty 30, 30d supply, fill #1
  Filled 2022-07-25: qty 30, 30d supply, fill #2
  Filled 2022-09-04 (×2): qty 30, 30d supply, fill #3

## 2022-05-26 ENCOUNTER — Other Ambulatory Visit (HOSPITAL_COMMUNITY): Payer: Self-pay

## 2022-05-29 ENCOUNTER — Other Ambulatory Visit (HOSPITAL_COMMUNITY): Payer: Self-pay

## 2022-05-30 ENCOUNTER — Other Ambulatory Visit (HOSPITAL_COMMUNITY): Payer: Self-pay

## 2022-05-31 ENCOUNTER — Telehealth: Payer: Self-pay | Admitting: Family Medicine

## 2022-05-31 NOTE — Telephone Encounter (Signed)
-----   Message from Ozella Almond, [place] sent at 05/29/2022 10:00 AM EDT ----- Good morning, tried to schedule CT for patient and was told that it needed to be without contrast.  They would not allow me to change it and wanted the ordering physician to.  Please change order if patient still needs imaging.  She is self pay so there is no chance of authorization expiring.  Thanks  [employer]

## 2022-05-31 NOTE — Telephone Encounter (Signed)
Called patient and she says that she continues to have intermittent abdominal pain.  This is no longer acute.  Patient opted to just have follow-up with primary care doctor 6/28 rather than going ahead with CT scan.

## 2022-06-05 ENCOUNTER — Other Ambulatory Visit (HOSPITAL_COMMUNITY): Payer: Self-pay

## 2022-06-05 ENCOUNTER — Encounter (HOSPITAL_COMMUNITY): Payer: Self-pay | Admitting: Psychiatry

## 2022-06-05 ENCOUNTER — Telehealth (INDEPENDENT_AMBULATORY_CARE_PROVIDER_SITE_OTHER): Payer: Medicaid Other | Admitting: Psychiatry

## 2022-06-05 DIAGNOSIS — F315 Bipolar disorder, current episode depressed, severe, with psychotic features: Secondary | ICD-10-CM | POA: Diagnosis not present

## 2022-06-05 MED ORDER — SERTRALINE HCL 25 MG PO TABS
25.0000 mg | ORAL_TABLET | Freq: Every day | ORAL | 3 refills | Status: DC
  Filled 2022-06-05: qty 30, 30d supply, fill #0
  Filled 2022-07-04: qty 30, 30d supply, fill #1
  Filled 2022-07-25 – 2022-08-01 (×3): qty 30, 30d supply, fill #2
  Filled 2022-09-04: qty 30, 30d supply, fill #3

## 2022-06-05 MED ORDER — RISPERIDONE 1 MG PO TABS
1.0000 mg | ORAL_TABLET | Freq: Two times a day (BID) | ORAL | 3 refills | Status: DC
  Filled 2022-06-05: qty 60, 30d supply, fill #0
  Filled 2022-07-10: qty 60, 30d supply, fill #1
  Filled 2022-07-25: qty 60, 30d supply, fill #2

## 2022-06-05 MED ORDER — MELATONIN 3 MG PO TABS
3.0000 mg | ORAL_TABLET | Freq: Every day | ORAL | 3 refills | Status: DC
  Filled 2022-06-05 – 2022-09-04 (×3): qty 30, 30d supply, fill #0

## 2022-06-05 MED ORDER — TRAZODONE HCL 50 MG PO TABS
50.0000 mg | ORAL_TABLET | Freq: Every day | ORAL | 3 refills | Status: DC
  Filled 2022-06-05 – 2022-07-25 (×2): qty 30, 30d supply, fill #0
  Filled 2022-09-03: qty 30, 30d supply, fill #1

## 2022-06-05 NOTE — Progress Notes (Signed)
BH MD/PA/NP OP Progress Note Virtual Visit via Video Note  I connected with Sherri [name] on 06/05/22 at  1:00 PM EDT by a video enabled telemedicine application and verified that I am speaking with the correct person using two identifiers.  Location: Patient: Home Provider: Clinic   I discussed the limitations of evaluation and management by telemedicine and the availability of in person appointments. The patient expressed understanding and agreed to proceed.  I provided 30 minutes of non-face-to-face time during this encounter.   06/05/2022 1:21 PM Sherri [name]  MRN:  [id number]  Chief Complaint: "The Risperdal does not help much." [facility]   HPI:  21 year old female seen today for follow-up psychiatric evaluation.  She has a psychiatric history of bipolar disorder, SI, anxiety and depression. Currently she is managed on Risperal 0.5 mg twice daily, melatonin 3 mg,  and Trazodone 50 mg nightly as needed. She notes that her medications are somewhat effective in managing her psychiatric conditions.   Today patient is well-groomed, pleasant, cooperative, engaged in conversation, and maintains eye contact. She notes that the Risperdal has been ineffective in managing her psychiatric conditions. She notes that she hallucinates and reports that her voices tell her to harm herself and steal things. She also notes that she occasionally sees dark shadows. Patient notes that mentally she feels out of control. She reports to cope she engaged in self harm by scratching  herself with her nails.Patient endorses passive SI but denies wanting to harm herself today. Today she denies SI/HI, mania, or paranoia. Patient reports that her appetite has increased and notes that she has gained 30 pounds. She notes that she was taking Ozempic but stopped it do to GI upset. She notes that she  will follow up with her PCP in a few weeks to discuss weight loss options.  Patient informed writer that  the above exacerbates her anxiety and depression. Today provider conducted a GAD 7 and patient scored a  19. Provider also conducted PHQ 9 and patient scored a 27. She notes that she sleeps 3-8 hours nightly.    Patient notes that she is upset because she does not see neurology as much as she use to help manage her Tourette's.  Patient notes in the past Zoloft was effective in managing her anxiety. She notes that she was last on it at age 82 and stopped due to heart palpitations however reports that she would like to restart it. Provider agreeable to restart Zoloft 25 mg to help manage anxiety and depression. Risperdal increased from 0.5 mg twice daily to 1 mg twice daily to help manage psychosis and sleep. She will continue all other mediations as prescribed. Patient continues therapy at [facility] weekly. No other concerns noted at this time.     Visit Diagnosis:    ICD-10-CM   1. Bipolar disorder, current episode depressed, severe, with psychotic features (HCC)  F31.5 traZODone (DESYREL) 50 MG tablet      Past Psychiatric History: Bipolar disorder, SI, anxiety and depression  Past Medical History:  Past Medical History:  Diagnosis Date    Chronic pain 02/02/2020   Abnormal vision screen 08/26/2015   Acanthosis nigricans 02/26/2020   Acid reflux    Allergy    Seasonal allergies   Anxiety    Phreesia 11/24/2020   Asthma    Atopic dermatitis 07/14/2013   Carpal tunnel syndrome, bilateral 11/21/2018   S/p bilat surgery, release   Chest pain 07/16/2019   Concussion  02/16/2021   Approx 2/15 d/t hit posterior skull against wall during tic.    Constipation 06/17/2013   Cough 01/29/2021   COVID-19 01/22/2020   Eczema    Elevated blood pressure reading 02/26/2020   Gallstones    GERD (gastroesophageal reflux disease)    Phreesia 11/24/2020   Headache    Obesity    Pain of both hip joints 11/10/2019   PCOS (polycystic ovarian syndrome) 02/26/2020   Seasonal allergies    Tic      Past Surgical History:  Procedure Laterality Date   BILATERAL CARPAL TUNNEL RELEASE     CHOLECYSTECTOMY N/A 08/08/2017   Procedure: LAPAROSCOPIC CHOLECYSTECTOMY;  Surgeon: Kandice Hams, MD;  Location: [facility] OR;  Service: General;  Laterality: N/A;   UPPER GASTROINTESTINAL ENDOSCOPY      Family Psychiatric History: Mother depression, brother depression, Sister depression, 2 brother learning disability, a maternal aunt depression  Family History:  Family History  Problem Relation Age of Onset   Asthma Mother    Arthritis Mother    Depression Mother    Diabetes Mother    Hypertension Mother    Obesity Mother    Irritable bowel syndrome Mother    Asthma Sister    Learning disabilities Sister    Stroke Sister    Asthma Brother    Depression Brother    Learning disabilities Brother    Irritable bowel syndrome Brother    Depression Maternal Aunt    Diabetes Maternal Aunt    Mental illness Maternal Aunt    Diabetes Maternal Uncle    Kidney disease Maternal Uncle    Arthritis Maternal Grandmother    Diabetes Maternal Grandmother    Heart disease Maternal Grandmother    Kidney disease Maternal Grandmother    Hypertension Maternal Grandmother    Hyperlipidemia Maternal Grandmother    Cervical cancer Maternal Grandmother    Stroke Maternal Grandfather    Cancer Other    COPD Other    Heart disease Other    Hypertension Other    Obesity Father    Heart attack Father    Heart disease Paternal Grandmother    Hypertension Paternal Grandmother    Colon cancer Neg Hx    Esophageal cancer Neg Hx    Pancreatic cancer Neg Hx    Stomach cancer Neg Hx     Social History:  Social History   Socioeconomic History   Marital status: Single    Spouse name: Not on file   Number of children: 0   Years of education: 12   Highest education level: GED or equivalent  Occupational History   Occupation: Furniture conservator/restorer to college   Occupation: Seeking disability  Tobacco Use   Smoking status:  Never    Passive exposure: Yes   Smokeless tobacco: Never   Tobacco comments:    mom stopped smoking  Vaping Use   Vaping Use: Never used  Substance and Sexual Activity   Alcohol use: No   Drug use: No   Sexual activity: Never  Other Topics Concern   Not on file  Social History Narrative   Lives with Mom and some siblings, some of whom have children of their own, dropped out of [employer]. plans to start [facility] for GED.   Right-handed.   No daily use of caffeine.    Social Determinants of Health   Financial Resource Strain: Not on file  Food Insecurity: No Food Insecurity (06/08/2020)   Hunger Vital Sign    Worried About Running  Out of Food in the Last Year: Never true    Ran Out of Food in the Last Year: Never true  Transportation Needs: Unmet Transportation Needs (06/08/2020)   PRAPARE - Administrator, Civil Service (Medical): Yes    Lack of Transportation (Non-Medical): Yes  Physical Activity: Not on file  Stress: Not on file  Social Connections: Not on file    Allergies:  Allergies  Allergen Reactions   Shellfish Allergy Hives and Itching    Mouth itches, facial hives.     Metabolic Disorder Labs: Lab Results  Component Value Date   HGBA1C 5.3 12/07/2021   MPG 123 02/11/2020   MPG 126 07/18/2019   Lab Results  Component Value Date   PROLACTIN 12.2 07/18/2019   Lab Results  Component Value Date   CHOL 136 01/27/2021   TRIG 74 01/27/2021   HDL 42 01/27/2021   CHOLHDL 3.2 01/27/2021   VLDL 16 07/25/2013   LDLCALC 79 01/27/2021   LDLCALC 66 08/01/2018   Lab Results  Component Value Date   TSH 1.02 03/23/2022   TSH 0.60 04/18/2021    Therapeutic Level Labs: No results found for: "LITHIUM" No results found for: "VALPROATE" No results found for: "CBMZ"  Current Medications: Current Outpatient Medications  Medication Sig Dispense Refill   sertraline (ZOLOFT) 25 MG tablet Take 1 tablet (25 mg total) by mouth daily. 30 tablet 3   albuterol  (VENTOLIN HFA) 108 (90 Base) MCG/ACT inhaler Inhale 2 puffs into the lungs every 6 (six) hours as needed for wheezing or shortness of breath. 18 g 2   benzonatate (TESSALON) 200 MG capsule Take 1 capsule (200 mg total) by mouth 2 (two) times daily as needed for cough. (Patient not taking: Reported on 04/14/2022) 20 capsule 0   cetirizine (ZYRTEC) 10 MG tablet Take 1 tablet (10 mg total) by mouth daily. 30 tablet 11   dicyclomine (BENTYL) 10 MG capsule Take 1 capsule (10 mg total) by mouth 2 (two) times daily as needed for spasms. 90 capsule 0   etonogestrel (NEXPLANON) 68 MG IMPL implant 1 each (68 mg total) by Subdermal route once. 1 each 0   famotidine (PEPCID) 20 MG tablet Take 1 tablet (20 mg total) by mouth at bedtime. 90 tablet 1   FLOVENT HFA 110 MCG/ACT inhaler Inhale 2 puffs into the lungs 2 (two) times daily. (Patient not taking: Reported on 05/03/2022) 12 g 11   linaclotide (LINZESS) 72 MCG capsule Take 1 capsule (72 mcg total) by mouth daily before breakfast. 30 capsule 3   melatonin 3 MG TABS tablet Take 1 tablet (3 mg total) by mouth at bedtime. 30 tablet 3   metFORMIN (GLUCOPHAGE) 500 MG tablet Take 1 tablet (500 mg total) by mouth 2 (two) times daily with a meal. 180 tablet 1   nirmatrelvir & ritonavir (PAXLOVID, 300/100,) 20 x 150 MG & 10 x 100MG  TBPK Take 3 tablets by mouth 2 (two) times daily for 5 days (Patient not taking: Reported on 04/14/2022) 30 tablet 0   ondansetron (ZOFRAN) 8 MG tablet Take 0.5-1 tablets (4-8 mg total) by mouth every 6 (six) hours as needed for nausea & vomiting 60 tablet 1   pantoprazole (PROTONIX) 20 MG tablet Take 1 tablet (20 mg total) by mouth daily. 30 tablet 3   promethazine (PHENERGAN) 25 MG tablet Take 1 tablet (25 mg total) by mouth every 6 (six) hours as needed for nausea or vomiting. 30 tablet 0   risperiDONE (RISPERDAL)  1 MG tablet Take 1 tablet (1 mg total) by mouth 2 (two) times daily. 60 tablet 3   Spacer/Aero Chamber Mouthpiece MISC 1 Device by  Does not apply route as needed. (Patient not taking: Reported on 05/03/2022) 1 each 0   traZODone (DESYREL) 50 MG tablet Take 1 tablet (50 mg total) by mouth at bedtime. 30 tablet 3   triamcinolone cream in Minerin Creme Apply 1 application topically 2 (two) times daily. Do not use for more than 7 consecutive days 454 g 0   No current facility-administered medications for this visit.     Musculoskeletal: Strength & Muscle Tone: within normal limits, telehealth visit Gait & Station: normal, telehealth visit Patient leans: N/A  Psychiatric Specialty Exam: Review of Systems  There were no vitals taken for this visit.There is no height or weight on file to calculate BMI.  General Appearance: Well Groomed  Eye Contact:  Good  Speech:  Clear and Coherent and Normal Rate  Volume:  Normal  Mood:  Anxious and Depressed  Affect:  Appropriate and Congruent  Thought Process:  Coherent, Goal Directed, and Linear  Orientation:  Full (Time, Place, and Person)  Thought Content: Logical and Hallucinations: Auditory Visual   Suicidal Thoughts:  Yes.  without intent/plan  Homicidal Thoughts:  No  Memory:  Immediate;   Good Recent;   Good Remote;   Good  Judgement:  Fair  Insight:  Fair  Psychomotor Activity:  Normal  Concentration:  Concentration: Good and Attention Span: Good  Recall:  Good  Fund of Knowledge: Good  Language: Good  Akathisia:  No  Handed:  Right  AIMS (if indicated): not done  Assets:  Communication Skills Desire for Improvement Financial Resources/Insurance Housing Physical Health Social Support  ADL's:  Intact  Cognition: WNL  Sleep:  Poor   Screenings: GAD-7    Flowsheet Row [facility] from 02/10/2020 [facility] [place] [facility]  Total GAD-7 Score 19 18 12 21       PHQ2-9    Flowsheet Row Video [facility]20[facility]20[facility]  PHQ-2 Total Score 6 6 6 4 6   PHQ-9 Total Score 27 27 27 21 25       Flowsheet Row Video [facility] from [facility]/03/2022 in [facility]  C-SSRS RISK CATEGORY Error: Q7 should not be populated when Q6 is No No Risk No Risk        Assessment and Plan: Patient endorses symptoms of anxiety, depression, VAH, and self injures behaviors.    1. Bipolar disorder, current episode depressed, severe, with psychotic features (HCC)  Continue- melatonin 3 MG TABS tablet; Take 1 tablet (3 mg total) by mouth at bedtime.  Dispense: 30 tablet; Refill: 3 Increased- risperiDONE (RISPERDAL) 1 MG tablet; Take 1 tablet (1 mg total) by mouth 2 (two) times daily.  Dispense: 60 tablet; Refill: 3 Continue- traZODone (DESYREL) 50 MG tablet;  Take 1 tablet (50 mg total) by mouth at bedtime.  Dispense: 30 tablet; Refill: 3 Start- sertraline (ZOLOFT) 25 MG tablet; Take 1 tablet (25 mg total) by mouth daily.  Dispense: 30 tablet; Refill: 3  Follow-up in 3 Follow-up with therapy   Shanna Cisco, NP 06/05/2022, 1:22 PM

## 2022-06-07 ENCOUNTER — Other Ambulatory Visit (HOSPITAL_COMMUNITY): Payer: Self-pay

## 2022-06-14 ENCOUNTER — Ambulatory Visit (INDEPENDENT_AMBULATORY_CARE_PROVIDER_SITE_OTHER): Payer: Medicaid Other | Admitting: Family Medicine

## 2022-06-14 ENCOUNTER — Other Ambulatory Visit (HOSPITAL_COMMUNITY): Payer: Self-pay

## 2022-06-14 ENCOUNTER — Encounter: Payer: Self-pay | Admitting: Family Medicine

## 2022-06-14 VITALS — BP 102/62 | HR 87 | Ht 60.0 in | Wt 263.4 lb

## 2022-06-14 DIAGNOSIS — M546 Pain in thoracic spine: Secondary | ICD-10-CM | POA: Diagnosis not present

## 2022-06-14 DIAGNOSIS — Z713 Dietary counseling and surveillance: Secondary | ICD-10-CM

## 2022-06-14 DIAGNOSIS — F331 Major depressive disorder, recurrent, moderate: Secondary | ICD-10-CM | POA: Diagnosis not present

## 2022-06-14 DIAGNOSIS — L680 Hirsutism: Secondary | ICD-10-CM | POA: Diagnosis not present

## 2022-06-14 DIAGNOSIS — G8929 Other chronic pain: Secondary | ICD-10-CM | POA: Diagnosis not present

## 2022-06-14 DIAGNOSIS — N921 Excessive and frequent menstruation with irregular cycle: Secondary | ICD-10-CM

## 2022-06-14 DIAGNOSIS — N62 Hypertrophy of breast: Secondary | ICD-10-CM | POA: Diagnosis not present

## 2022-06-14 DIAGNOSIS — R7303 Prediabetes: Secondary | ICD-10-CM

## 2022-06-14 MED ORDER — LIRAGLUTIDE 18 MG/3ML ~~LOC~~ SOPN
0.6000 mg | PEN_INJECTOR | Freq: Every day | SUBCUTANEOUS | 1 refills | Status: DC
  Filled 2022-06-14: qty 3, 30d supply, fill #0

## 2022-06-14 NOTE — Assessment & Plan Note (Signed)
>>  ASSESSMENT AND PLAN FOR WEIGHT LOSS COUNSELING, ENCOUNTER FOR WRITTEN ON 06/14/2022  2:16 PM BY Fayette Pho, MD  Previously tried Ozempic and Topamax.  Currently on metformin.  Considered Wellbutrin versus Saxenda.  Patient amenable to trying Saxenda despite previous nausea and abdominal pain with Ozempic.  Strict stop precautions discussed.  No family history of MEN tumors, no personal history of pancreatitis. - Start Saxenda 0.6 mg daily x1 week - Titration weekly by 0.6 mg (lower if needed) - Return in 2 weeks for follow-up visit to assess tolerance - Return in 4 weeks for PCP visit - If she does not tolerate Saxenda, could consider Wellbutrin instead

## 2022-06-14 NOTE — Assessment & Plan Note (Signed)
>>  ASSESSMENT AND PLAN FOR MAJOR DEPRESSION WRITTEN ON 06/14/2022  2:17 PM BY Fayette Pho, MD  PHQ-9 today 23, question #9 positive.  Patient reports chronicity of depression and suicidal ideation.  Denies current plan or intent.  Is currently treated by psychiatry.

## 2022-06-14 NOTE — Progress Notes (Signed)
SUBJECTIVE:   CHIEF COMPLAINT / HPI:   Desired weight loss Patient presents today to discuss desired weight loss.  She previously tried Ozempic but had to discontinue due to nausea, abdominal pain, and vomiting.  In the past, has worked with a Data processing manager but did not find it very helpful.  She is hesitant to work with the dietitian again or go to healthy weight and wellness at this time because she relies on food stamps for her groceries and cannot buy specific brands that a dietitian may recommend.  She is currently on metformin for prediabetes.  Previously was on Topamax for tics mainly, however did not see any weight loss effects with this.  She is interested in bariatric surgery, however would like to try some other nonsurgical options prior to pursuing this.  PERTINENT  PMH / PSH:, IBS, moderate persistent asthma, migraine without aura, tic disorder, hirsutism, thoracic back pain secondary to macromastia  OBJECTIVE:   BP 102/62   Pulse 87   Ht 5' (1.524 m)   Wt 263 lb 6.4 oz (119.5 kg)   SpO2 99%   BMI 51.44 kg/m    PHQ-9:     06/14/2022   11:17 AM 06/05/2022   12:52 PM 05/09/2022    2:02 PM  Depression screen PHQ 2/9  Decreased Interest 3  3  Down, Depressed, Hopeless 3  3  PHQ - 2 Score 6  6  Altered sleeping 3  3  Tired, decreased energy 3  3  Change in appetite 3  3  Feeling bad or failure about yourself  2  3  Trouble concentrating 2  3  Moving slowly or fidgety/restless 2  3  Suicidal thoughts 2  3  PHQ-9 Score 23  27  Difficult doing work/chores Very difficult  Extremely dIfficult     Information is confidential and restricted. Go to Review Flowsheets to unlock data.     GAD-7:     01/30/2022   11:39 AM 11/03/2021   10:11 AM 06/08/2020    3:25 PM 02/10/2020    3:14 PM  GAD 7 : Generalized Anxiety Score  Nervous, Anxious, on Edge   3 3  Control/stop worrying   0 3  Worry too much - different things   0 3  Trouble relaxing   3 3  Restless   3 3  Easily  annoyed or irritable   3 3  Afraid - awful might happen   0 3  Total GAD 7 Score   12 21  Anxiety Difficulty         Information is confidential and restricted. Go to Review Flowsheets to unlock data.    Physical Exam General: Awake, alert, oriented, no acute distress Respiratory: Unlabored respirations, speaking in full sentences, no respiratory distress Extremities: Moving all extremities spontaneously Neuro: Cranial nerves II through X grossly intact, intermittent tics of head, neck, arms Psych: Normal insight and judgement  ASSESSMENT/PLAN:   Weight loss counseling, encounter for Previously tried Ozempic and Topamax.  Currently on metformin.  Considered Wellbutrin versus Saxenda.  Patient amenable to trying Saxenda despite previous nausea and abdominal pain with Ozempic.  Strict stop precautions discussed.  No family history of MEN tumors, no personal history of pancreatitis. - Start Saxenda 0.6 mg daily x1 week - Titration weekly by 0.6 mg (lower if needed) - Return in 2 weeks for follow-up visit to assess tolerance - Return in 4 weeks for PCP visit - If she does not tolerate Saxenda,  could consider Wellbutrin instead  Major depression PHQ-9 today 23, question #9 positive.  Patient reports chronicity of depression and suicidal ideation.  Denies current plan or intent.  Is currently treated by psychiatry.    Fayette Pho, MD [facility]

## 2022-06-14 NOTE — Assessment & Plan Note (Signed)
Previously tried Ozempic and Topamax.  Currently on metformin.  Considered Wellbutrin versus Saxenda.  Patient amenable to trying Saxenda despite previous nausea and abdominal pain with Ozempic.  Strict stop precautions discussed.  No family history of MEN tumors, no personal history of pancreatitis. - Start Saxenda 0.6 mg daily x1 week - Titration weekly by 0.6 mg (lower if needed) - Return in 2 weeks for follow-up visit to assess tolerance - Return in 4 weeks for PCP visit - If she does not tolerate Saxenda, could consider Wellbutrin instead

## 2022-06-14 NOTE — Assessment & Plan Note (Signed)
PHQ-9 today 23, question #9 positive.  Patient reports chronicity of depression and suicidal ideation.  Denies current plan or intent.  Is currently treated by psychiatry.

## 2022-06-14 NOTE — Patient Instructions (Signed)
It was wonderful to see you today. Thank you for allowing me to be a part of your care. Below is a short summary of what we discussed at your visit today:  Weight loss Lets try Saxenda (liraglutide).  Be very careful with this, as you reacted poorly to the Ozempic previously.  Saxenda taper schedule Week 1: 0.6 mg once daily Week 2: 1.2 mg once daily Week 3: 1.8 mg daily Week 4: 2.4 mg daily  Come back for your appointment in 2 weeks to follow-up on tolerance. Come back in 4 weeks to speak with your PCP about how you are doing.  If you develop severe nausea, abdominal pain, vomiting, or worsening mood STOP THE MEDICATION and let me know via phone call or MyChart.    Please bring all of your medications to every appointment!  If you have any questions or concerns, please do not hesitate to contact us via phone or MyChart message.   Fayette Pho, MD

## 2022-06-15 ENCOUNTER — Other Ambulatory Visit (HOSPITAL_COMMUNITY): Payer: Self-pay

## 2022-06-15 MED ORDER — LIRAGLUTIDE -WEIGHT MANAGEMENT 18 MG/3ML ~~LOC~~ SOPN
PEN_INJECTOR | SUBCUTANEOUS | 0 refills | Status: AC
  Filled 2022-06-15 – 2022-06-23 (×4): qty 6, 25d supply, fill #0

## 2022-06-15 NOTE — Addendum Note (Signed)
Addended by: Valetta Close on: 06/15/2022 11:35 AM   Modules accepted: Orders

## 2022-06-16 ENCOUNTER — Other Ambulatory Visit (HOSPITAL_COMMUNITY): Payer: Self-pay

## 2022-06-19 ENCOUNTER — Other Ambulatory Visit (HOSPITAL_COMMUNITY): Payer: Self-pay

## 2022-06-22 ENCOUNTER — Telehealth: Payer: Self-pay

## 2022-06-22 NOTE — Telephone Encounter (Signed)
Will forward to MD for next steps.

## 2022-06-22 NOTE — Telephone Encounter (Signed)
Patient calls nurse line in regards to [place].   Patient reports Medicaid is requiring a prio authorization.   Will forward to [place].

## 2022-06-23 ENCOUNTER — Other Ambulatory Visit (HOSPITAL_COMMUNITY): Payer: Self-pay

## 2022-06-26 ENCOUNTER — Other Ambulatory Visit (HOSPITAL_COMMUNITY): Payer: Self-pay

## 2022-06-27 ENCOUNTER — Ambulatory Visit: Payer: Medicaid Other | Admitting: Family Medicine

## 2022-07-04 ENCOUNTER — Other Ambulatory Visit (HOSPITAL_COMMUNITY): Payer: Self-pay

## 2022-07-06 ENCOUNTER — Telehealth (HOSPITAL_COMMUNITY): Payer: Medicaid Other | Admitting: Psychiatry

## 2022-07-10 ENCOUNTER — Encounter: Payer: Self-pay | Admitting: Family Medicine

## 2022-07-10 ENCOUNTER — Ambulatory Visit (INDEPENDENT_AMBULATORY_CARE_PROVIDER_SITE_OTHER): Payer: Medicaid Other | Admitting: Family Medicine

## 2022-07-10 ENCOUNTER — Other Ambulatory Visit (HOSPITAL_COMMUNITY): Payer: Self-pay

## 2022-07-10 DIAGNOSIS — G8929 Other chronic pain: Secondary | ICD-10-CM | POA: Diagnosis not present

## 2022-07-10 DIAGNOSIS — N62 Hypertrophy of breast: Secondary | ICD-10-CM

## 2022-07-10 DIAGNOSIS — N921 Excessive and frequent menstruation with irregular cycle: Secondary | ICD-10-CM

## 2022-07-10 DIAGNOSIS — L680 Hirsutism: Secondary | ICD-10-CM | POA: Diagnosis not present

## 2022-07-10 DIAGNOSIS — Z6841 Body Mass Index (BMI) 40.0 and over, adult: Secondary | ICD-10-CM | POA: Diagnosis not present

## 2022-07-10 DIAGNOSIS — F339 Major depressive disorder, recurrent, unspecified: Secondary | ICD-10-CM

## 2022-07-10 DIAGNOSIS — N939 Abnormal uterine and vaginal bleeding, unspecified: Secondary | ICD-10-CM

## 2022-07-10 DIAGNOSIS — M546 Pain in thoracic spine: Secondary | ICD-10-CM | POA: Diagnosis not present

## 2022-07-10 DIAGNOSIS — R7303 Prediabetes: Secondary | ICD-10-CM | POA: Diagnosis not present

## 2022-07-10 DIAGNOSIS — Z713 Dietary counseling and surveillance: Secondary | ICD-10-CM

## 2022-07-10 MED ORDER — BUPROPION HCL ER (SR) 100 MG PO TB12
100.0000 mg | ORAL_TABLET | Freq: Every day | ORAL | 2 refills | Status: DC
  Filled 2022-07-10: qty 30, 30d supply, fill #0
  Filled 2022-08-01: qty 30, 30d supply, fill #1
  Filled 2022-09-04: qty 30, 30d supply, fill #2

## 2022-07-10 NOTE — Patient Instructions (Signed)
It was wonderful to see you today. Thank you for allowing me to be a part of your care. Below is a short summary of what we discussed at your visit today:  Weight loss desired Start Wellbutrin 100 mg daily.  I have referred you to the bariatric surgery clinic.  If you do not hear from them in 1 to 2 weeks, please let us know.  See below for the manufacture coupon information for Saxenda.  Let us know if this is affordable for you and you want to start the Saxenda.  Please bring all of your medications to every appointment!  If you have any questions or concerns, please do not hesitate to contact us via phone or MyChart message.   Fayette Pho, MD   ------------------------------------  Many manufacturers offer programs that will reduce your out-of-pocket costs for this prescription. These programs are free but may have some rules or restrictions, so you'll want to review carefully. When you're ready to use this coupon, simply present the coupon to your pharmacist with a valid prescription for your medication.  Program Name: [employer] Card Provider: [employer] Phone Number: [phone] Website: [email or web address]://www.[employer].com/obesity/products/saxenda/savings-offer.html How do I get the discount? Register online to download and print a card. How much can I save? You can save up to $200 per month. Commercially insured patients may pay as little as $25 per 30-day supply. Do I need insurance? No. However, you will pay more without commercial insurance. Number of uses: Up to 24 uses. Expiration: 24 months from activation.

## 2022-07-10 NOTE — Progress Notes (Signed)
    SUBJECTIVE:   CHIEF COMPLAINT / HPI:   Desired weight loss Patient follows up to discuss desired weight loss.  Previously discussed Saxenda daily injection.  Unfortunately, insurance did not approve. Patient has previously tried Ozempic but did not tolerate due to gastrointestinal upset.   Patient declines nutritionist and referral to healthy weight [place] at this time.  She has been counseled by nutritionist in the past and did not find it helpful.  She declines healthy weight and wellness out of cost considerations; she and her mom rely on food stamps and she does not believe she will be able to obtain particular brands of food required by healthy weight and wellness.  Would not want to trial stimulant given her concurrent anxiety and tic disorder.  PERTINENT  PMH / PSH: Obesity with BMI 51, GERD, hirsutism, abnormal uterine bleeding, chronic low back pain, prediabetes, menorrhagia, macromastia with associated thoracic back pain  OBJECTIVE:   BP 121/63   Pulse 79   Ht 5' (1.524 m)   Wt 265 lb 3.2 oz (120.3 kg)   SpO2 100%   BMI 51.79 kg/m   PHQ-9:     07/10/2022    2:19 PM 06/14/2022   11:17 AM 06/05/2022   12:52 PM  Depression screen PHQ 2/9  Decreased Interest 2 3   Down, Depressed, Hopeless 2 3   PHQ - 2 Score 4 6   Altered sleeping 1 3   Tired, decreased energy 2 3   Change in appetite 3 3   Feeling bad or failure about yourself  2 2   Trouble concentrating 3 2   Moving slowly or fidgety/restless 1 2   Suicidal thoughts 1 2   PHQ-9 Score 17 23   Difficult doing work/chores Very difficult Very difficult      Information is confidential and restricted. Go to Review Flowsheets to unlock data.    Physical Exam General: Awake, alert, oriented, no acute distress Respiratory: Unlabored respirations, speaking in full sentences, no respiratory distress Extremities: Moving all extremities spontaneously Psych: Normal insight and judgement   ASSESSMENT/PLAN:   Weight  loss counseling, encounter for Insurance would not cover Saxenda generic formulation.  Failed Ozempic due to stomach upset.  Previously tried Topamax without success.  Currently on metformin, however would not want to increase due to concurrent GI upset.  Patient declines nutritionist and healthy weight and wellness at this time, see note for more. - Referral to bariatric surgery clinic - Trial of Wellbutrin in the meantime, start 100 mg daily  Depression, recurrent (HCC) Positive question #9 on PHQ-9.  Discussed with patient, at her baseline.  No plans or intent.  Continue to follow with psychiatry.       Fayette Pho, MD [facility]

## 2022-07-11 ENCOUNTER — Other Ambulatory Visit (HOSPITAL_COMMUNITY): Payer: Self-pay

## 2022-07-11 ENCOUNTER — Other Ambulatory Visit (HOSPITAL_BASED_OUTPATIENT_CLINIC_OR_DEPARTMENT_OTHER): Payer: Self-pay

## 2022-07-12 NOTE — Assessment & Plan Note (Signed)
>>  ASSESSMENT AND PLAN FOR WEIGHT LOSS COUNSELING, ENCOUNTER FOR WRITTEN ON 07/12/2022  5:46 PM BY Fayette Pho, MD  Insurance would not cover [place] generic formulation.  Failed Ozempic due to stomach upset.  Previously tried Topamax without success.  Currently on metformin, however would not want to increase due to concurrent GI upset.  Patient declines nutritionist and healthy weight and wellness at this time, see note for more. - Referral to bariatric surgery clinic - Trial of Wellbutrin in the meantime, start 100 mg daily

## 2022-07-12 NOTE — Assessment & Plan Note (Signed)
Positive question #9 on PHQ-9.  Discussed with patient, at her baseline.  No plans or intent.  Continue to follow with psychiatry.

## 2022-07-12 NOTE — Assessment & Plan Note (Signed)
Insurance would not cover [employer] generic formulation.  Failed Ozempic due to stomach upset.  Previously tried Topamax without success.  Currently on metformin, however would not want to increase due to concurrent GI upset.  Patient declines nutritionist and healthy weight and wellness at this time, see note for more. - Referral to bariatric surgery clinic - Trial of Wellbutrin in the meantime, start 100 mg daily

## 2022-07-17 ENCOUNTER — Other Ambulatory Visit (HOSPITAL_BASED_OUTPATIENT_CLINIC_OR_DEPARTMENT_OTHER): Payer: Self-pay

## 2022-07-25 ENCOUNTER — Other Ambulatory Visit (HOSPITAL_COMMUNITY): Payer: Self-pay

## 2022-07-25 ENCOUNTER — Other Ambulatory Visit: Payer: Self-pay | Admitting: Family Medicine

## 2022-07-25 DIAGNOSIS — L309 Dermatitis, unspecified: Secondary | ICD-10-CM

## 2022-07-25 MED ORDER — TRIAMCINOLONE ACETONIDE 0.1 % EX CREA
1.0000 "application " | TOPICAL_CREAM | Freq: Two times a day (BID) | CUTANEOUS | 0 refills | Status: DC
  Filled 2022-07-25: qty 454, 30d supply, fill #0

## 2022-08-01 ENCOUNTER — Other Ambulatory Visit (HOSPITAL_COMMUNITY): Payer: Self-pay

## 2022-08-14 ENCOUNTER — Other Ambulatory Visit: Payer: Self-pay

## 2022-08-14 ENCOUNTER — Ambulatory Visit (INDEPENDENT_AMBULATORY_CARE_PROVIDER_SITE_OTHER): Payer: Medicaid Other | Admitting: Family Medicine

## 2022-08-14 ENCOUNTER — Encounter: Payer: Self-pay | Admitting: Family Medicine

## 2022-08-14 ENCOUNTER — Other Ambulatory Visit (HOSPITAL_COMMUNITY): Payer: Self-pay

## 2022-08-14 VITALS — BP 114/81 | HR 104 | Wt 263.0 lb

## 2022-08-14 DIAGNOSIS — L299 Pruritus, unspecified: Secondary | ICD-10-CM

## 2022-08-14 DIAGNOSIS — M791 Myalgia, unspecified site: Secondary | ICD-10-CM | POA: Insufficient documentation

## 2022-08-14 DIAGNOSIS — M6281 Muscle weakness (generalized): Secondary | ICD-10-CM | POA: Diagnosis present

## 2022-08-14 MED ORDER — DIPHENHYDRAMINE-ZINC ACETATE 2-0.1 % EX CREA
1.0000 | TOPICAL_CREAM | Freq: Three times a day (TID) | CUTANEOUS | 2 refills | Status: DC | PRN
  Filled 2022-08-14: qty 28.4, 10d supply, fill #0

## 2022-08-14 NOTE — Patient Instructions (Addendum)
It was wonderful to see you today. Thank you for allowing me to be a part of your care. Below is a short summary of what we discussed at your visit today:  Arm and leg weakness Today we are going to start with a lot of labs.  I am going to check a muscle enzyme, a lot of electrolytes and vitamins, liver function, kidney function, and blood counts. If the results are normal, I will send you a letter or MyChart message. If the results are abnormal, I will give you a call.    Because physical therapy helped immensely last time, I will send you to physical therapy this time too. Someone from their office should be calling you in 1 to 2 weeks to schedule an appointment.  If you do not hear from them, let us know. We may need to nudge along the referral.    Because the weakness involves both your arms and legs, it is much less likely a spinal issue at a specific point.  We will not do x-rays today.  We can consider them in the future if you have more localized symptoms.  Come back in 2 to 3 weeks for follow-up.  Weight loss Below is the information to the bariatric clinic we referred you to.  If you have not heard from them already, please call them directly to schedule an appointment. [facility] Address: [address], [place], [place] [address] Phone: [phone]   Please bring all of your medications to every appointment!  If you have any questions or concerns, please do not hesitate to contact us via phone or MyChart message.   Fayette Pho, MD

## 2022-08-14 NOTE — Assessment & Plan Note (Addendum)
Subacute, insidious worsening.  Unclear etiology, given no recent infection this episode.  Differential is broad but given distribution, unlikely to be focal spinal involvement; thus decline spinal imaging at this time. No recent illness or vaccination to suggest postinflammatory sequela or GBS.  New meds include Seroquel x2 months and Wellbutrin x1 month; I do not believe this is a known adverse reaction of either medication. Wonder if electrolyte versus medication induced versus psychosomatic.  We will start with laboratory work and physical therapy.  Labs today include CBC, CMP, CK, mag, vitamin B12, vitamin D.

## 2022-08-14 NOTE — Progress Notes (Signed)
SUBJECTIVE:   CHIEF COMPLAINT / HPI:   Arm and leg weakness Seen in family medicine clinic 04/11/22 and evaluated for lower extremity weakness immediately after COVID infection.  Patient was utilizing wheelchair, walker, and cane at home due to weakness.  She reports 1 fall down the stairs due to weakness. At the time, weakness was thought to be mainly due to deconditioning secondary to COVID, with possible psychosomatic component as she had recently stopped her psychiatric medications.  Labs around that time were normal including TSH, CMP, CBC with differential x2, vitamin B12, and CK.  Physician at the time discussed more ambulation and referred to PT.  Today, patient reports return of symptoms, now with upper arm weakness as well.  She does not believe she ever completely recovered from the weakness due to COVID, and she has been now worsening gradually.  She and her mom do believe that physical therapy helped a great deal in April.  Patient reports subjective leg weakness and pain when standing for long periods of time.  Also describes feeling of heaviness in the legs, like "a weight is on my legs pushing them down".  In the past couple of days, legs of hurt so bad when standing or moving that she has been relying on aids such as wheelchair, walker, and cane.  Pain acutely worsened yesterday after a full day of walking around doing errands.  Does endorse shooting pains proximally with standing, located both laterally and medially on legs.  Different from before, she is also experiencing pain in shoulders and upper arms with use, specifically reaching up overhead or trying to do her hair.  No acute changes in grip strength or sensation.  No prior imaging of back.  No recent falls.  Denies saddle anesthesia and bowel or bladder incontinence.  No known family history of rheumatoid arthritis, hypothyroidism, or other autoimmune diseases.  Maternal grandmother had similar presentation but in her  65s.  Mother and brother have degenerative disc disease.  Med review performed, only new medication is Wellbutrin.  This was started back in July by myself as an adjunct of treatment for weight loss.  Patient reports meds for schizophrenia.  I see the medications she is referring to in the chart, but there is no diagnosis of schizophrenia anywhere in her chart.  The psychiatry notes do reference bipolar disorder with psychotic features.   PERTINENT  PMH / PSH: COVID infection, prediabetes, unspecified tic disorder, migraine without aura, IBS, moderate persistent asthma, menorrhagia, macromastia with chronic thoracic back pain, depression.  OBJECTIVE:   BP 114/81   Pulse (!) 104   Wt 263 lb (119.3 kg)   SpO2 100%   BMI 51.36 kg/m    PHQ-9:     08/14/2022   11:01 AM 07/10/2022    2:19 PM 06/14/2022   11:17 AM  Depression screen PHQ 2/9  Decreased Interest 2 2 3   Down, Depressed, Hopeless 2 2 3   PHQ - 2 Score 4 4 6   Altered sleeping 2 1 3   Tired, decreased energy 2 2 3   Change in appetite 3 3 3   Feeling bad or failure about yourself  0 2 2  Trouble concentrating 0 3 2  Moving slowly or fidgety/restless 2 1 2   Suicidal thoughts 0 1 2  PHQ-9 Score 13 17 23   Difficult doing work/chores  Very difficult Very difficult    Physical Exam General: Awake, alert, oriented Respiratory: Speaking in full sentences, no respiratory distress Extremities: Strength 3-4 /  5 of bilateral lower extremities including hip flexion, knee flexion/extension, and foot plantarflexion/dorsiflexion; strength 4/5 of bilateral upper extremities.  Unclear to examiner if neurologic versus effort versus pain Neuro: Cranial nerves II through X grossly intact, able to move all extremities spontaneously, reflexes present - patellar, biceps, and brachioradialis  ASSESSMENT/PLAN:   Muscle weakness of all 4 extremities Subacute, insidious worsening.  Unclear etiology, given no recent infection this episode.   Differential is broad but given distribution, unlikely to be focal spinal involvement; thus decline spinal imaging at this time. No recent illness or vaccination to suggest postinflammatory sequela or GBS.  New meds include Seroquel x2 months and Wellbutrin x1 month; I do not believe this is a known adverse reaction of either medication. Wonder if electrolyte versus medication induced versus psychosomatic.  We will start with laboratory work and physical therapy.  Labs today include CBC, CMP, CK, mag, vitamin B12, vitamin D.    Fayette Pho, MD [facility]

## 2022-08-15 ENCOUNTER — Telehealth: Payer: Self-pay | Admitting: Family Medicine

## 2022-08-15 DIAGNOSIS — E559 Vitamin D deficiency, unspecified: Secondary | ICD-10-CM

## 2022-08-15 DIAGNOSIS — E538 Deficiency of other specified B group vitamins: Secondary | ICD-10-CM

## 2022-08-15 LAB — MAGNESIUM: Magnesium: 1.8 mg/dL (ref 1.6–2.3)

## 2022-08-15 LAB — VITAMIN D 25 HYDROXY (VIT D DEFICIENCY, FRACTURES): Vit D, 25-Hydroxy: 16.5 ng/mL — ABNORMAL LOW (ref 30.0–100.0)

## 2022-08-15 LAB — COMPREHENSIVE METABOLIC PANEL
ALT: 11 IU/L (ref 0–32)
AST: 14 IU/L (ref 0–40)
Albumin/Globulin Ratio: 1.5 (ref 1.2–2.2)
Albumin: 4 g/dL (ref 4.0–5.0)
Alkaline Phosphatase: 82 IU/L (ref 42–106)
BUN/Creatinine Ratio: 10 (ref 9–23)
BUN: 8 mg/dL (ref 6–20)
Bilirubin Total: <0.2 mg/dL (ref 0.0–1.2)
CO2: 23 mmol/L (ref 20–29)
Calcium: 9.4 mg/dL (ref 8.7–10.2)
Chloride: 104 mmol/L (ref 96–106)
Creatinine, Ser: 0.83 mg/dL (ref 0.57–1.00)
Globulin, Total: 2.7 g/dL (ref 1.5–4.5)
Glucose: 85 mg/dL (ref 70–99)
Potassium: 4.6 mmol/L (ref 3.5–5.2)
Sodium: 145 mmol/L — ABNORMAL HIGH (ref 134–144)
Total Protein: 6.7 g/dL (ref 6.0–8.5)
eGFR: 104 mL/min/{1.73_m2} (ref >59)

## 2022-08-15 LAB — CBC
Hematocrit: 40 % (ref 34.0–46.6)
Hemoglobin: 13.2 g/dL (ref 11.1–15.9)
MCH: 27.9 pg (ref 26.6–33.0)
MCHC: 33 g/dL (ref 31.5–35.7)
MCV: 85 fL (ref 79–97)
Platelets: 373 10*3/uL (ref 150–450)
RBC: 4.73 x10E6/uL (ref 3.77–5.28)
RDW: 13.8 % (ref 11.7–15.4)
WBC: 6.1 10*3/uL (ref 3.4–10.8)

## 2022-08-15 LAB — VITAMIN B12: Vitamin B-12: 465 pg/mL (ref 232–1245)

## 2022-08-15 LAB — CK: Total CK: 135 U/L (ref 32–182)

## 2022-08-15 MED ORDER — VITAMIN D 25 MCG (1000 UNIT) PO TABS
1000.0000 [IU] | ORAL_TABLET | Freq: Every day | ORAL | 0 refills | Status: DC
  Filled 2022-08-15 – 2023-01-07 (×6): qty 90, 90d supply, fill #0

## 2022-08-15 MED ORDER — VITAMIN B-12 1000 MCG PO TABS
1000.0000 ug | ORAL_TABLET | Freq: Every day | ORAL | 0 refills | Status: DC
  Filled 2022-08-15 – 2023-01-07 (×6): qty 90, 90d supply, fill #0

## 2022-08-15 NOTE — Telephone Encounter (Signed)
Called patient regarding labs. CMP unremarkable.  Vitamin D was in the deficient range, vitamin B12 lower than recommended by neurologist.  We will order vitamin B12 daily supplementation.  Discussed vitamin D with patient, she prefers daily medication.  We will send in 1000 units daily.  Both meds will be a 90-day supply.  Patient to return for recheck when they run out.  Fayette Pho, MD

## 2022-08-16 ENCOUNTER — Other Ambulatory Visit (HOSPITAL_COMMUNITY): Payer: Self-pay

## 2022-08-18 ENCOUNTER — Encounter: Payer: Self-pay | Admitting: Family Medicine

## 2022-08-18 DIAGNOSIS — R29898 Other symptoms and signs involving the musculoskeletal system: Secondary | ICD-10-CM

## 2022-08-22 NOTE — Telephone Encounter (Signed)
Orders placed per request.  Fayette Pho, MD

## 2022-09-03 NOTE — Therapy (Unsigned)
OUTPATIENT PHYSICAL THERAPY THORACOLUMBAR EVALUATION   Patient Name: Sherri [name] MRN: [id number] DOB:16-Dec-2001, 21 y.o., female Today's Date: 09/04/2022   PT End of Session - 09/04/22 1005     Visit Number 6    Number of Visits 27    Date for PT Re-Evaluation 05/12/22    Authorization Type [facility] Medicaid    Authorization Time Period 04/18/22-06/02/22    Authorization - Visit Number 4    Authorization - Number of Visits 12    PT Start Time 1000    PT Stop Time 1040    PT Time Calculation (min) 40 min    Activity Tolerance Patient tolerated treatment well;Patient limited by fatigue    Behavior During Therapy [facility] for tasks assessed/performed             Past Medical History:  Diagnosis Date    Chronic pain 02/02/2020   Abnormal vision screen 08/26/2015   Acanthosis nigricans 02/26/2020   Acid reflux    Allergy    Seasonal allergies   Anxiety    Phreesia 11/24/2020   Asthma    Atopic dermatitis 07/14/2013   Carpal tunnel syndrome, bilateral 11/21/2018   S/p bilat surgery, release   Chest pain 07/16/2019   Concussion 02/16/2021   Approx 2/15 d/t hit posterior skull against wall during tic.    Constipation 06/17/2013   Cough 01/29/2021   COVID-19 01/22/2020   Eczema    Elevated blood pressure reading 02/26/2020   Gallstones    GERD (gastroesophageal reflux disease)    Phreesia 11/24/2020   Headache    Obesity    Pain of both hip joints 11/10/2019   PCOS (polycystic ovarian syndrome) 02/26/2020   Recurrent viral infection 05/15/2022   Seasonal allergies    Tic    Past Surgical History:  Procedure Laterality Date   BILATERAL CARPAL TUNNEL RELEASE     CHOLECYSTECTOMY N/A 08/08/2017   Procedure: LAPAROSCOPIC CHOLECYSTECTOMY;  Surgeon: Stanford Scotland, MD;  Location: [place];  Service: General;  Laterality: N/A;   UPPER GASTROINTESTINAL ENDOSCOPY     Patient Active Problem List   Diagnosis Date Noted   Muscle ache 08/14/2022   Weight loss counseling, encounter for  06/14/2022   Depression, recurrent ([place]) 05/15/2022   Gait disturbance 04/23/2022   Muscle weakness of all 4 extremities 04/13/2022   Major depression 10/07/2021   Abnormal uterine bleeding (AUB) 05/12/2021   Movement disorder 04/14/2021   Macromastia 03/16/2021   Thoracic back pain 03/16/2021   Poor appetite 03/16/2021   Hand pain, not arthralgia 02/16/2021   Healthcare maintenance 01/29/2021   Tic disorder, unspecified 08/10/2020   Hirsutism 02/26/2020   Migraine without aura and without status migrainosus, not intractable 02/10/2020   Moderate persistent asthma 02/10/2020   Irritable bowel syndrome 02/10/2020   Chronic nonintractable headache 02/02/2020   Pre-diabetes 07/16/2019   Breast pain 07/16/2019   Sleeping difficulty 07/16/2019   Menorrhagia with irregular cycle 07/16/2019   Chronic low back pain without sciatica 04/26/2017   Gastroesophageal reflux  07/29/2015   Vitamin D deficiency 07/29/2013   Morbid obesity ([place]) 07/29/2013   Asthma, chronic 07/14/2013   Allergic rhinitis 07/14/2013    PCP: Dr. Ezequiel Essex, MD  REFERRING PROVIDER: Dr. Dorcas Mcmurray, MD  REFERRING DIAG: [id number] (ICD-10-CM) - Muscle weakness of all 4 extremities R29.898 (ICD-10-CM) - Arm weakness M79.10 (ICD-10-CM) - Muscle ache   Rationale for Evaluation and Treatment Rehabilitation  THERAPY DIAG: Global weakness, unknown etiology   ONSET DATE: April 2023  SUBJECTIVE:                                                                                                                                                                                           SUBJECTIVE STATEMENT: Returns to OPPT due to continued weakness of BLEs and now UEs.  Blood work has been inconclusive for causation.  Was initially given a Dx of vitamin D insufficiency however supplements have not helped despite taking for 1 month. PERTINENT HISTORY:  Arm and leg weakness Seen in family medicine clinic 04/11/22 and evaluated  for lower extremity weakness immediately after COVID infection.  Patient was utilizing wheelchair, walker, and cane at home due to weakness.  She reports 1 fall down the stairs due to weakness. At the time, weakness was thought to be mainly due to deconditioning secondary to COVID, with possible psychosomatic component as she had recently stopped her psychiatric medications.  Labs around that time were normal including TSH, CMP, CBC with differential x2, vitamin B12, and CK.  Physician at the time discussed more ambulation and referred to PT.   Today, patient reports return of symptoms, now with upper arm weakness as well.  She does not believe she ever completely recovered from the weakness due to COVID, and she has been now worsening gradually.  She and her mom do believe that physical therapy helped a great deal in April.   Patient reports subjective leg weakness and pain when standing for long periods of time.  Also describes feeling of heaviness in the legs, like "a weight is on my legs pushing them down".  In the past couple of days, legs of hurt so bad when standing or moving that she has been relying on aids such as wheelchair, walker, and cane.  Pain acutely worsened yesterday after a full day of walking around doing errands.  Does endorse shooting pains proximally with standing, located both laterally and medially on legs.   Different from before, she is also experiencing pain in shoulders and upper arms with use, specifically reaching up overhead or trying to do her hair.  No acute changes in grip strength or sensation.   No prior imaging of back.  No recent falls.   Denies saddle anesthesia and bowel or bladder incontinence.   No known family history of rheumatoid arthritis, hypothyroidism, or other autoimmune diseases.  Maternal grandmother had similar presentation but in her 44s.  Mother and brother have degenerative disc disease.   Med review performed, only new medication is Wellbutrin.   This was started back in July by myself as an adjunct of treatment for weight loss.  Patient reports  meds for schizophrenia.  I see the medications she is referring to in the chart, but there is no diagnosis of schizophrenia anywhere in her chart.  The psychiatry notes do reference bipolar disorder with psychotic features.     PERTINENT  PMH / PSH: COVID infection, prediabetes, unspecified tic disorder, migraine without aura, IBS, moderate persistent asthma, menorrhagia, macromastia with chronic thoracic back pain, depression.  PAIN:  Are you having pain? Yes: NPRS scale: 9/10 Pain location: B LEs Pain description: ache Aggravating factors: weight bearing tasks Relieving factors: rest   PRECAUTIONS: Fall  WEIGHT BEARING RESTRICTIONS No  FALLS:  Has patient fallen in last 6 months? Yes. Number of falls 1  LIVING ENVIRONMENT: Lives with: lives with their family  OCCUPATION: none  PLOF: Independent  PATIENT GOALS To walk by Halloween   OBJECTIVE:   DIAGNOSTIC FINDINGS:  STUDY DATE: 04/23/2021 PATIENT NAME: JOSELINNE BERLINER DOB: 2001/12/05 MRN: [id number]   EXAM: MRI Brain with and without contrast   ORDERING CLINICIAN: Marcial Pacas MD, PhD CLINICAL HISTORY: 21 year old woman with movement disorder COMPARISON FILMS: None   TECHNIQUE:MRI of the brain with and without contrast was obtained utilizing 5 mm axial slices with T1, T2, T2 flair, SWI and diffusion weighted views.  T1 sagittal, T2 coronal and postcontrast views in the axial and coronal plane were obtained. CONTRAST: 20 ml [employer] IMAGING SITE: [facility], [place].   FINDINGS: On sagittal images, the spinal cord is imaged caudally to C5 and is normal in caliber.   The contents of the posterior fossa are of normal size and position.   The pituitary gland and optic chiasm appear normal.    Brain volume appears normal.   The ventricles are normal in size and without distortion.  There are no abnormal  extra-axial collections of fluid.     The cerebellum and brainstem appears normal.   The deep gray matter appears normal.  The cerebral hemispheres appear normal.   Diffusion weighted images are normal.  Susceptibility weighted images are normal.      The orbits appear normal.   The VIIth/VIIIth nerve complex appears normal.  The mastoid air cells appear normal.  The paranasal sinuses appear normal.  Flow voids are identified within the major intracerebral arteries.     After the infusion of contrast material, a normal enhancement pattern is noted.     IMPRESSION: This is a normal MRI of the brain with and without contrast.    PATIENT SURVEYS:  Not taken  SCREENING FOR RED FLAGS:  Negative COGNITION:  Overall cognitive status: Within functional limits for tasks assessed     SENSATION: Not tested  MUSCLE LENGTH: Hamstrings: Right 90 deg; Left 90 deg  POSTURE: Not assessed to to inability to stand unsupported   DTRs: Equal in BLEs  LUMBAR ROM: Not tested  Active  A/PROM  eval  Flexion   Extension   Right lateral flexion   Left lateral flexion   Right rotation   Left rotation    (Blank rows = not tested)  LOWER EXTREMITY ROM:   WFL through observation  Active  Right eval Left eval  Hip flexion    Hip extension    Hip abduction    Hip adduction    Hip internal rotation    Hip external rotation    Knee flexion    Knee extension    Ankle dorsiflexion    Ankle plantarflexion    Ankle inversion    Ankle eversion     (  Blank rows = not tested)  LOWER EXTREMITY MMT:  Cogwheeling noted with MMT  MMT Right eval Left eval  Hip flexion 4- 4-  Hip extension    Hip abduction    Hip adduction    Hip internal rotation    Hip external rotation    Knee flexion    Knee extension 4- 4-  Ankle dorsiflexion 4- 4-  Ankle plantarflexion 4 4  Ankle inversion    Ankle eversion     (Blank rows = not tested)  LUMBAR SPECIAL TESTS:  Not applicable  FUNCTIONAL TESTS:   5 times sit to stand: 27s arms crossed  GAIT: Distance walked: 39ft x2 Assistive device utilized: Environmental consultant - 2 wheeled Level of assistance: Modified independence Comments: Shows decreased TKE and buckling but able to prevent fall to ground.    TODAY'S TREATMENT  Eval    PATIENT EDUCATION:  Education details: Discussed eval findings, rehab rationale and POC and patient is in agreement  Person educated: Patient Education method: Explanation and Handouts Education comprehension: verbalized understanding and needs further education   HOME EXERCISE PROGRAM:  Access Code: [email or web address] URL: [email or web address] Date: 09/04/2022 Prepared by: Sharlynn Oliphant  Exercises - Stair Negotiation with Single Rail Using Step-To Pattern (One Step at a Time)  - 1 x daily - 7 x weekly - Supine Heel Slide  - 1-2 x daily - 7 x weekly - 1-2 sets - 5 reps - Supine Knee Extension Strengthening  - 1-2 x daily - 7 x weekly - 1-2 sets - 5 reps - Supine Short Arc Quad  - 1-2 x daily - 7 x weekly - 1-2 sets - 5 reps - Seated Long Arc Quad  - 1-2 x daily - 7 x weekly - 1-2 sets - 5 reps - Seated Heel Toe Raises  - 1 x daily - 7 x weekly - 3 sets - 10 reps - Supine Bridge  - 1 x daily - 7 x weekly - 3 sets - 10 reps - Beginner Clam  - 1 x daily - 7 x weekly - 3 sets - 10 reps - Small Range Straight Leg Raise  - 1 x daily - 7 x weekly - 1 sets - 10 reps  ASSESSMENT:  CLINICAL IMPRESSION: Patient is a 21 y.o. female who was seen today for physical therapy evaluation and treatment for B UE and LE weakness. Patient ambulating with a RW but has a WC to assist with home mobility.  Patient is able to t/f sit to stand independently and complete 5x STS test w/o need of UE assist.  DTRs are present and equal B.  MMT of upper and lower extremities show cogwheeling with resistance.  Ambulation shows a decreased ability to maintain TKE however full AROM noted in B knees.     OBJECTIVE IMPAIRMENTS Abnormal  gait, decreased activity tolerance, decreased balance, decreased coordination, decreased endurance, decreased knowledge of condition, decreased mobility, difficulty walking, decreased strength, impaired perceived functional ability, impaired UE functional use, postural dysfunction, obesity, and pain.   ACTIVITY LIMITATIONS carrying, lifting, standing, squatting, stairs, and transfers  PERSONAL FACTORS Age, Behavior pattern, Fitness, Past/current experiences, and Time since onset of injury/illness/exacerbation are also affecting patient's functional outcome.   REHAB POTENTIAL: Fair based on   CLINICAL DECISION MAKING: Evolving/moderate complexity  EVALUATION COMPLEXITY: Moderate   GOALS: Goals reviewed with patient? No  SHORT TERM GOALS: Target date: 09/18/2022  Initiate aquatic PT program Baseline:TBD Goal status: INITIAL   LONG TERM GOALS: Target date: 10/02/2022  Patient to demonstrate independence in HEP  Baseline: [id number] Goal status: INITIAL  2.  Decrease 5x STS time to 25s or less Baseline: 27s Goal status: INITIAL  3.  264ft ambulation with RW and S Baseline: 70ft with RW and S Goal status: INITIAL    PLAN: PT FREQUENCY: 2x/week  PT DURATION: 4 weeks  PLANNED INTERVENTIONS: Therapeutic exercises, Therapeutic activity, Neuromuscular re-education, Balance training, Gait training, Patient/Family education, Self Care, Joint mobilization, Stair training, Aquatic Therapy, and Re-evaluation.  PLAN FOR NEXT SESSION: Initiate aquatic PT for generalized strength and mobility training, gait and balance tasks,    Lanice Shirts, PT 09/04/2022, 10:07 AM   Check all possible CPT codes: [id number] - PT Re-evaluation, 97110- Therapeutic Exercise, [phone]- Neuro Re-education, [phone] - Gait Training, [phone] - Manual Therapy, 97530 - Therapeutic Activities, and 97535 - Self Care     If treatment provided at initial evaluation, no treatment charged due to lack of authorization.

## 2022-09-04 ENCOUNTER — Other Ambulatory Visit (HOSPITAL_COMMUNITY): Payer: Self-pay

## 2022-09-04 ENCOUNTER — Ambulatory Visit: Payer: Medicaid Other | Attending: Family Medicine

## 2022-09-04 DIAGNOSIS — R293 Abnormal posture: Secondary | ICD-10-CM | POA: Diagnosis present

## 2022-09-04 DIAGNOSIS — R262 Difficulty in walking, not elsewhere classified: Secondary | ICD-10-CM | POA: Diagnosis present

## 2022-09-04 DIAGNOSIS — M6281 Muscle weakness (generalized): Secondary | ICD-10-CM | POA: Diagnosis present

## 2022-09-04 NOTE — Patient Instructions (Signed)
Aquatic Therapy at [facility]-  What to Expect!  Where:   [facility] [address] [facility] [place], [place] [address] Rehab phone [phone]  NOTE:  You will receive an automated phone message reminding you of your appt and it will say the appointment is at the [address] [facility] [address] [facility] clinic.          How to Prepare: Please make sure you drink 8 ounces of water about one hour prior to your pool session A caregiver may attend if needed with the patient to help assist as needed. A caregiver can sit in the pool room on chair. Please arrive IN YOUR SUIT and 15 minutes prior to your appointment - this helps to avoid delays in starting your session. Please make sure to attend to any toileting needs prior to entering the pool Locker rooms for changing are provided.   There is direct access to the pool deck form the locker room.  You can lock your belongings in a locker with lock provided. Once on the pool deck your therapist will ask if you have signed the Patient  Consent and Assignment of Benefits form before beginning treatment Your therapist may take your blood pressure prior to, during and after your session if indicated We usually try and create a home exercise program based on activities we do in the pool.  Please be thinking about who might be able to assist you in the pool should you need to participate in an aquatic home exercise program at the time of discharge if you need assistance.  Some patients do not want to or do not have the ability to participate in an aquatic home program - this is not a barrier in any way to you participating in aquatic therapy as part of your current therapy plan! After Discharge from PT, you can continue using home program at  the [facility]/, there is a drop-in fee for $5 ($45 a month)or for 60 years  or older $4.00 ($40 a month for seniors ) or any local [facility] pool.  Memberships for purchase are  available for gym/pool at [facility]  IT IS VERY IMPORTANT THAT YOUR LAST VISIT BE IN THE CLINIC AT [address] AFTER YOUR LAST AQUATIC VISIT.  PLEASE MAKE SURE THAT YOU HAVE A [address]  APPOINTMENT SCHEDULED.   About the pool: Pool is located approximately 500 FT from the entrance of the building.  Please bring a support person if you need assistance traveling this      distance.   Your therapist will assist you in entering the water; there are two ways to           enter: stairs with railings, and a mechanical lift. Your therapist will determine the most appropriate way for you.  Water temperature is usually between 88-90 degrees  There may be up to 2 other swimmers in the pool at the same time  The pool deck is tile, please wear shoes with good traction if you prefer not to be barefoot.    Contact Info:  For appointment scheduling and cancellations:         Please call the [facility]  PH:[phone]              Aquatic Therapy  [facility]patient [facility]       All sessions are 45 minutes                                                    

## 2022-09-05 ENCOUNTER — Other Ambulatory Visit (HOSPITAL_COMMUNITY): Payer: Self-pay

## 2022-09-06 ENCOUNTER — Encounter (HOSPITAL_COMMUNITY): Payer: Self-pay | Admitting: Psychiatry

## 2022-09-06 ENCOUNTER — Telehealth (INDEPENDENT_AMBULATORY_CARE_PROVIDER_SITE_OTHER): Payer: Medicaid Other | Admitting: Psychiatry

## 2022-09-06 ENCOUNTER — Other Ambulatory Visit (HOSPITAL_COMMUNITY): Payer: Self-pay

## 2022-09-06 DIAGNOSIS — F315 Bipolar disorder, current episode depressed, severe, with psychotic features: Secondary | ICD-10-CM

## 2022-09-06 MED ORDER — RISPERIDONE 1 MG PO TABS
1.0000 mg | ORAL_TABLET | Freq: Two times a day (BID) | ORAL | 3 refills | Status: DC
  Filled 2022-09-06: qty 60, 30d supply, fill #0
  Filled 2022-10-24: qty 60, 30d supply, fill #1

## 2022-09-06 MED ORDER — MELATONIN 3 MG PO TABS
3.0000 mg | ORAL_TABLET | Freq: Every day | ORAL | 3 refills | Status: DC
  Filled 2022-09-06: qty 30, 30d supply, fill #0

## 2022-09-06 MED ORDER — SERTRALINE HCL 50 MG PO TABS
50.0000 mg | ORAL_TABLET | Freq: Every day | ORAL | 3 refills | Status: DC
  Filled 2022-09-06: qty 50, 50d supply, fill #0
  Filled 2022-10-24: qty 50, 50d supply, fill #1

## 2022-09-06 MED ORDER — TRAZODONE HCL 50 MG PO TABS
50.0000 mg | ORAL_TABLET | Freq: Every day | ORAL | 3 refills | Status: DC
  Filled 2022-09-06 – 2022-10-24 (×2): qty 30, 30d supply, fill #0

## 2022-09-06 NOTE — Progress Notes (Signed)
BH MD/PA/NP OP Progress Note Virtual Visit via Video Note  I connected with Sherri [name] on 09/06/22 at  1:00 PM EDT by a video enabled telemedicine application and verified that I am speaking with the correct person using two identifiers.  Location: Patient: Home Provider: Clinic   I discussed the limitations of evaluation and management by telemedicine and the availability of in person appointments. The patient expressed understanding and agreed to proceed.  I provided 30 minutes of non-face-to-face time during this encounter.   09/06/2022 1:29 PM Sherri [name]  MRN:  [id number]  Chief Complaint: "I am less anxious and depressed"   HPI:  21 year old female seen today for follow-up psychiatric evaluation.  She has a psychiatric history of bipolar disorder, SI, anxiety and depression. Currently she is managed on Risperal 1 mg twice daily, melatonin 3 mg, Zoloft 25 mg daily, and Trazodone 50 mg nightly as needed.  Patient also received Wellbutrin ER 100 mg from her PCP for appetite suppression.  She notes that her medications are somewhat effective in managing her psychiatric conditions.   Today patient is well-groomed, pleasant, cooperative, engaged in conversation, and maintains eye contact. She informed Clinical research associate that she has been less anxious and depressed.  She also notes that her mood is more stable.  Today provider conducted a GAD-7 and patient scored a 13, at her last visit she scored a 19.  Provider also conducted PHQ-9 and patient scored a 15, at her last visit she scored a 27.  She endorses adequate sleep (5 to 8 hours) with the assistance of trazodone.  Patient notes that her appetite is adequate as well.  Today she denies SI/HI/VAH, mania, paranoia.    Patient informed writer that she has been having leg pains.  She notes that it started after having COVID a few months ago.  She informed Clinical research associate that it is difficult for her to walk.  She still struggles with Tourette's however  notes that she has been told by neurology that there is nothing that they can do.  She asked writer if she could start Celexa to help manage Provider informed patient that she could not treat Tourette's and noted that she was already on 3 antidepressants which could potentially cause serotonin syndrome.  Provider informed patient to discuss with her PCP discontinuing Wellbutrin for appetite suppression.  She endorsed understanding and agreed.  Today Zoloft increased from 25 mg to 50 mg to help manage symptoms of anxiety and depression. Patient will continue therapy at [facility] weekly. No other concerns noted at this time.     Visit Diagnosis:    ICD-10-CM   1. Bipolar disorder, current episode depressed, severe, with psychotic features (HCC)  F31.5 traZODone (DESYREL) 50 MG tablet    sertraline (ZOLOFT) 50 MG tablet    risperiDONE (RISPERDAL) 1 MG tablet    melatonin 3 MG TABS tablet      Past Psychiatric History: Bipolar disorder, SI, anxiety and depression  Past Medical History:  Past Medical History:  Diagnosis Date    Chronic pain 02/02/2020   Abnormal vision screen 08/26/2015   Acanthosis nigricans 02/26/2020   Acid reflux    Allergy    Seasonal allergies   Anxiety    Phreesia 11/24/2020   Asthma    Atopic dermatitis 07/14/2013   Carpal tunnel syndrome, bilateral 11/21/2018   S/p bilat surgery, release   Chest pain 07/16/2019   Concussion 02/16/2021   Approx 2/15 d/t hit posterior skull against wall  during tic.    Constipation 06/17/2013   Cough 01/29/2021   COVID-19 01/22/2020   Eczema    Elevated blood pressure reading 02/26/2020   Gallstones    GERD (gastroesophageal reflux disease)    Phreesia 11/24/2020   Headache    Obesity    Pain of both hip joints 11/10/2019   PCOS (polycystic ovarian syndrome) 02/26/2020   Recurrent viral infection 05/15/2022   Seasonal allergies    Tic     Past Surgical History:  Procedure Laterality Date   BILATERAL CARPAL TUNNEL  RELEASE     CHOLECYSTECTOMY N/A 08/08/2017   Procedure: LAPAROSCOPIC CHOLECYSTECTOMY;  Surgeon: Stanford Scotland, MD;  Location: [place];  Service: General;  Laterality: N/A;   UPPER GASTROINTESTINAL ENDOSCOPY      Family Psychiatric History: Mother depression, brother depression, Sister depression, 2 brother learning disability, a maternal aunt depression  Family History:  Family History  Problem Relation Age of Onset   Asthma Mother    Arthritis Mother    Depression Mother    Diabetes Mother    Hypertension Mother    Obesity Mother    Irritable bowel syndrome Mother    Asthma Sister    Learning disabilities Sister    Stroke Sister    Asthma Brother    Depression Brother    Learning disabilities Brother    Irritable bowel syndrome Brother    Depression Maternal Aunt    Diabetes Maternal Aunt    Mental illness Maternal Aunt    Diabetes Maternal Uncle    Kidney disease Maternal Uncle    Arthritis Maternal Grandmother    Diabetes Maternal Grandmother    Heart disease Maternal Grandmother    Kidney disease Maternal Grandmother    Hypertension Maternal Grandmother    Hyperlipidemia Maternal Grandmother    Cervical cancer Maternal Grandmother    Stroke Maternal Grandfather    Cancer Other    COPD Other    Heart disease Other    Hypertension Other    Obesity Father    Heart attack Father    Heart disease Paternal Grandmother    Hypertension Paternal Grandmother    Colon cancer Neg Hx    Esophageal cancer Neg Hx    Pancreatic cancer Neg Hx    Stomach cancer Neg Hx     Social History:  Social History   Socioeconomic History   Marital status: Single    Spouse name: Not on file   Number of children: 0   Years of education: 12   Highest education level: GED or equivalent  Occupational History   Occupation: Control and instrumentation engineer to college   Occupation: Seeking disability  Tobacco Use   Smoking status: Never    Passive exposure: Yes   Smokeless tobacco: Never   Tobacco comments:     mom stopped smoking  Vaping Use   Vaping Use: Never used  Substance and Sexual Activity   Alcohol use: No   Drug use: No   Sexual activity: Never  Other Topics Concern   Not on file  Social History Narrative   Lives with Mom and some siblings, some of whom have children of their own, dropped out of [employer]. plans to start [employer] for GED.   Right-handed.   No daily use of caffeine.    Social Determinants of Health   Financial Resource Strain: Not on file  Food Insecurity: No Food Insecurity (06/08/2020)   Hunger Vital Sign    Worried About Running Out of Food in the  Last Year: Never true    Ran Out of Food in the Last Year: Never true  Transportation Needs: Unmet Transportation Needs (06/08/2020)   PRAPARE - Administrator, Civil Service (Medical): Yes    Lack of Transportation (Non-Medical): Yes  Physical Activity: Not on file  Stress: Not on file  Social Connections: Not on file    Allergies:  Allergies  Allergen Reactions   Shellfish Allergy Hives and Itching    Mouth itches, facial hives.     Metabolic Disorder Labs: Lab Results  Component Value Date   HGBA1C 5.3 12/07/2021   MPG 123 02/11/2020   MPG 126 07/18/2019   Lab Results  Component Value Date   PROLACTIN 12.2 07/18/2019   Lab Results  Component Value Date   CHOL 136 01/27/2021   TRIG 74 01/27/2021   HDL 42 01/27/2021   CHOLHDL 3.2 01/27/2021   VLDL 16 07/25/2013   LDLCALC 79 01/27/2021   LDLCALC 66 08/01/2018   Lab Results  Component Value Date   TSH 1.02 03/23/2022   TSH 0.60 04/18/2021    Therapeutic Level Labs: No results found for: "LITHIUM" No results found for: "VALPROATE" No results found for: "CBMZ"  Current Medications: Current Outpatient Medications  Medication Sig Dispense Refill   albuterol (VENTOLIN HFA) 108 (90 Base) MCG/ACT inhaler Inhale 2 puffs into the lungs every 6 (six) hours as needed for wheezing or shortness of breath. 18 g 2   buPROPion ER  (WELLBUTRIN SR) 100 MG 12 hr tablet Take 1 tablet (100 mg total) by mouth daily. 30 tablet 2   cetirizine (ZYRTEC) 10 MG tablet Take 1 tablet (10 mg total) by mouth daily. 30 tablet 11   cholecalciferol (VITAMIN D3) 25 MCG (1000 UNIT) tablet Take 1 tablet (1,000 Units total) by mouth daily. Return for vit D recheck when this bottle runs out. 90 tablet 0   cyanocobalamin (VITAMIN B12) 1000 MCG tablet Take 1 tablet (1,000 mcg total) by mouth daily. Return for vit B12 recheck when this bottle runs out. 90 tablet 0   diphenhydrAMINE-zinc acetate (BENADRYL EXTRA STRENGTH) cream Apply 1 Application topically 3 (three) times daily as needed for itching. 28.4 g 2   etonogestrel (NEXPLANON) 68 MG IMPL implant 1 each (68 mg total) by Subdermal route once. 1 each 0   FLOVENT HFA 110 MCG/ACT inhaler Inhale 2 puffs into the lungs 2 (two) times daily. (Patient not taking: Reported on 05/03/2022) 12 g 11   linaclotide (LINZESS) 72 MCG capsule Take 1 capsule (72 mcg total) by mouth daily before breakfast. 30 capsule 3   melatonin 3 MG TABS tablet Take 1 tablet (3 mg total) by mouth at bedtime. 30 tablet 3   metFORMIN (GLUCOPHAGE) 500 MG tablet Take 1 tablet (500 mg total) by mouth 2 (two) times daily with a meal. 180 tablet 1   pantoprazole (PROTONIX) 20 MG tablet Take 1 tablet (20 mg total) by mouth daily. 30 tablet 3   risperiDONE (RISPERDAL) 1 MG tablet Take 1 tablet (1 mg total) by mouth 2 (two) times daily. 60 tablet 3   sertraline (ZOLOFT) 50 MG tablet Take 1 tablet (50 mg total) by mouth daily. 50 tablet 3   Spacer/Aero Chamber Mouthpiece MISC 1 Device by Does not apply route as needed. (Patient not taking: Reported on 05/03/2022) 1 each 0   traZODone (DESYREL) 50 MG tablet Take 1 tablet (50 mg total) by mouth at bedtime. 30 tablet 3   triamcinolone cream in Minerin  Creme Apply 1 application topically 2 (two) times daily. Do not use for more than 7 consecutive days 454 g 0   No current facility-administered  medications for this visit.     Musculoskeletal: Strength & Muscle Tone: within normal limits, telehealth visit Gait & Station: normal, telehealth visit Patient leans: N/A  Psychiatric Specialty Exam: Review of Systems  There were no vitals taken for this visit.There is no height or weight on file to calculate BMI.  General Appearance: Well Groomed  Eye Contact:  Good  Speech:  Clear and Coherent and Normal Rate  Volume:  Normal  Mood:  Euthymic  Affect:  Appropriate and Congruent  Thought Process:  Coherent, Goal Directed, and Linear  Orientation:  Full (Time, Place, and Person)  Thought Content: WDL and Logical   Suicidal Thoughts:  No  Homicidal Thoughts:  No  Memory:  Immediate;   Good Recent;   Good Remote;   Good  Judgement:  Good  Insight:  Good and Fair  Psychomotor Activity:  Normal  Concentration:  Concentration: Good and Attention Span: Good  Recall:  Good  Fund of Knowledge: Good  Language: Good  Akathisia:  No  Handed:  Right  AIMS (if indicated): not done  Assets:  Communication Skills Desire for Improvement Financial Resources/Insurance Housing Physical Health Social Support  ADL's:  Intact  Cognition: WNL  Sleep:  Good   Screenings: GAD-7    Flowsheet Row [facility]  Total GAD-7 Score 13 19 18 12 21       PHQ2-9    Flowsheet Row [facility]20[facility]20[facility]  PHQ-2 Total Score 4 4 4 6 6   PHQ-9 Total Score 15 13 17 23 27       Flowsheet Row [facility] from [facility]03/2022 in [facility]  C-SSRS RISK CATEGORY Error: Q7 should not be populated when Q6 is No No Risk No Risk        Assessment and Plan: Patient informed writer that she has days where she experiences anxiety and depression however notes that it has improved.  She also informed Clinical research associatewriter that she is concerned about her Tourette's and requested to start Celexa.  Provider informed patient that currently she was taken 3 antidepressants and provider was not qualified to treat her Tourette's.  Provider discussed patient speaking with her primary care doctor about discontinuing Wellbutrin for appetite suppression.  Patient endorsed understanding and agreed.  Today she is agreeable to increasing Zoloft 25 mg to 50 mg to help manage anxiety and depression.  She will continue her other medications as prescribed.  1. Bipolar disorder, current episode depressed, severe, with psychotic features (HCC)  Continue- traZODone (DESYREL) 50 MG tablet; Take 1 tablet (50 mg total) by mouth at bedtime.  Dispense: 30 tablet; Refill:  3 Increased- sertraline (ZOLOFT) 50 MG tablet; Take 1 tablet (50 mg total) by mouth daily.  Dispense: 50 tablet; Refill: 3 Continue- risperiDONE (RISPERDAL) 1 MG tablet; Take 1 tablet (1 mg total) by mouth 2 (two) times daily.  Dispense: 60 tablet; Refill: 3 Continue- melatonin 3 MG TABS tablet; Take 1 tablet (3 mg total) by mouth at bedtime.  Dispense: 30 tablet; Refill: 3  Follow-up in 3 Follow-up with therapy   Shanna Cisco, NP 09/06/2022, 1:29 PM

## 2022-09-13 ENCOUNTER — Other Ambulatory Visit (HOSPITAL_COMMUNITY): Payer: Self-pay

## 2022-09-13 ENCOUNTER — Other Ambulatory Visit: Payer: Self-pay | Admitting: Family Medicine

## 2022-09-13 DIAGNOSIS — J4540 Moderate persistent asthma, uncomplicated: Secondary | ICD-10-CM

## 2022-09-13 DIAGNOSIS — J4530 Mild persistent asthma, uncomplicated: Secondary | ICD-10-CM

## 2022-09-13 MED ORDER — ALBUTEROL SULFATE HFA 108 (90 BASE) MCG/ACT IN AERS
2.0000 | INHALATION_SPRAY | Freq: Four times a day (QID) | RESPIRATORY_TRACT | 0 refills | Status: DC | PRN
  Filled 2022-09-13: qty 18, 25d supply, fill #0

## 2022-09-14 ENCOUNTER — Other Ambulatory Visit (HOSPITAL_COMMUNITY): Payer: Self-pay

## 2022-09-14 NOTE — Therapy (Signed)
OUTPATIENT PHYSICAL THERAPY TREATMENT NOTE   Patient Name: Sherri [name] MRN: [id number] DOB:07/23/2001, 21 y.o., female Today's Date: 09/15/2022  PCP: Dr. Ezequiel Essex, MD REFERRING PROVIDER: Dr. Dorcas Mcmurray, MD  END OF SESSION:   PT End of Session - 09/15/22 1324     Visit Number 7    Number of Visits 27    Authorization Type [employer]    Authorization - Visit Number 5    Authorization - Number of Visits 12    PT Start Time 9702    PT Stop Time 1400    PT Time Calculation (min) 45 min    Activity Tolerance Patient tolerated treatment well;Patient limited by fatigue    Behavior During Therapy [facility] for tasks assessed/performed             Past Medical History:  Diagnosis Date    Chronic pain 02/02/2020   Abnormal vision screen 08/26/2015   Acanthosis nigricans 02/26/2020   Acid reflux    Allergy    Seasonal allergies   Anxiety    Phreesia 11/24/2020   Asthma    Atopic dermatitis 07/14/2013   Carpal tunnel syndrome, bilateral 11/21/2018   S/p bilat surgery, release   Chest pain 07/16/2019   Concussion 02/16/2021   Approx 2/15 d/t hit posterior skull against wall during tic.    Constipation 06/17/2013   Cough 01/29/2021   COVID-19 01/22/2020   Eczema    Elevated blood pressure reading 02/26/2020   Gallstones    GERD (gastroesophageal reflux disease)    Phreesia 11/24/2020   Headache    Obesity    Pain of both hip joints 11/10/2019   PCOS (polycystic ovarian syndrome) 02/26/2020   Recurrent viral infection 05/15/2022   Seasonal allergies    Tic    Past Surgical History:  Procedure Laterality Date   BILATERAL CARPAL TUNNEL RELEASE     CHOLECYSTECTOMY N/A 08/08/2017   Procedure: LAPAROSCOPIC CHOLECYSTECTOMY;  Surgeon: Stanford Scotland, MD;  Location: [facility];  Service: General;  Laterality: N/A;   UPPER GASTROINTESTINAL ENDOSCOPY     Patient Active Problem List   Diagnosis Date Noted   Muscle ache 08/14/2022   Weight loss counseling, encounter for 06/14/2022    Depression, recurrent ([place]) 05/15/2022   Gait disturbance 04/23/2022   Muscle weakness of all 4 extremities 04/13/2022   Major depression 10/07/2021   Abnormal uterine bleeding (AUB) 05/12/2021   Movement disorder 04/14/2021   Macromastia 03/16/2021   Thoracic back pain 03/16/2021   Poor appetite 03/16/2021   Hand pain, not arthralgia 02/16/2021   Healthcare maintenance 01/29/2021   Tic disorder, unspecified 08/10/2020   Hirsutism 02/26/2020   Migraine without aura and without status migrainosus, not intractable 02/10/2020   Moderate persistent asthma 02/10/2020   Irritable bowel syndrome 02/10/2020   Chronic nonintractable headache 02/02/2020   Pre-diabetes 07/16/2019   Breast pain 07/16/2019   Sleeping difficulty 07/16/2019   Menorrhagia with irregular cycle 07/16/2019   Chronic low back pain without sciatica 04/26/2017   Gastroesophageal reflux  07/29/2015   Vitamin D deficiency 07/29/2013   Morbid obesity ([place]) 07/29/2013   Asthma, chronic 07/14/2013   Allergic rhinitis 07/14/2013    REFERRING DIAG: M62.81 (ICD-10-CM) - Muscle weakness of all 4 extremities R29.898 (ICD-10-CM) - Arm weakness M79.10 (ICD-10-CM) - Muscle ache   THERAPY DIAG:  Muscle weakness (generalized)  Difficulty in walking, not elsewhere classified  Abnormal posture  Rationale for Evaluation and Treatment Rehabilitation  PERTINENT HISTORY: Arm and leg weakness Seen in  family medicine clinic 04/11/22 and evaluated for lower extremity weakness immediately after COVID infection.  Patient was utilizing wheelchair, walker, and cane at home due to weakness.  She reports 1 fall down the stairs due to weakness. At the time, weakness was thought to be mainly due to deconditioning secondary to COVID, with possible psychosomatic component as she had recently stopped her psychiatric medications.  Labs around that time were normal including TSH, CMP, CBC with differential x2, vitamin B12, and CK.  Physician at the  time discussed more ambulation and referred to PT.   Today, patient reports return of symptoms, now with upper arm weakness as well.  She does not believe she ever completely recovered from the weakness due to COVID, and she has been now worsening gradually.  She and her mom do believe that physical therapy helped a great deal in April.   Patient reports subjective leg weakness and pain when standing for long periods of time.  Also describes feeling of heaviness in the legs, like "a weight is on my legs pushing them down".  In the past couple of days, legs of hurt so bad when standing or moving that she has been relying on aids such as wheelchair, walker, and cane.  Pain acutely worsened yesterday after a full day of walking around doing errands.  Does endorse shooting pains proximally with standing, located both laterally and medially on legs.   Different from before, she is also experiencing pain in shoulders and upper arms with use, specifically reaching up overhead or trying to do her hair.  No acute changes in grip strength or sensation.   No prior imaging of back.  No recent falls.   Denies saddle anesthesia and bowel or bladder incontinence.   No known family history of rheumatoid arthritis, hypothyroidism, or other autoimmune diseases.  Maternal grandmother had similar presentation but in her 74s.  Mother and brother have degenerative disc disease.   Med review performed, only new medication is Wellbutrin.  This was started back in July by myself as an adjunct of treatment for weight loss.  Patient reports meds for schizophrenia.  I see the medications she is referring to in the chart, but there is no diagnosis of schizophrenia anywhere in her chart.  The psychiatry notes do reference bipolar disorder with psychotic features.  PERTINENT  PMH / PSH: COVID infection, prediabetes, unspecified tic disorder, migraine without aura, IBS, moderate persistent asthma, menorrhagia, macromastia with  chronic thoracic back pain, depression.  PRECAUTIONS: Fall  SUBJECTIVE: Patient reports no new falls since last session, continued leg pain.  PAIN:  Are you having pain? Yes: NPRS scale: 7/10 Pain location: B LEs Pain description: ache Aggravating factors: weight bearing tasks Relieving factors: rest   OBJECTIVE: (objective measures completed at initial evaluation unless otherwise dated)   DIAGNOSTIC FINDINGS:  STUDY DATE: 04/23/2021 PATIENT NAME: ALIZIA GREIF DOB: 2001-06-11 MRN: [id number]   EXAM: MRI Brain with and without contrast   ORDERING CLINICIAN: Marcial Pacas MD, PhD CLINICAL HISTORY: 21 year old woman with movement disorder COMPARISON FILMS: None   TECHNIQUE:MRI of the brain with and without contrast was obtained utilizing 5 mm axial slices with T1, T2, T2 flair, SWI and diffusion weighted views.  T1 sagittal, T2 coronal and postcontrast views in the axial and coronal plane were obtained. CONTRAST: 20 ml [employer] IMAGING SITE: [facility], [place].   FINDINGS: On sagittal images, the spinal cord is imaged caudally to C5 and is normal in caliber.  The contents of the posterior fossa are of normal size and position.   The pituitary gland and optic chiasm appear normal.    Brain volume appears normal.   The ventricles are normal in size and without distortion.  There are no abnormal extra-axial collections of fluid.     The cerebellum and brainstem appears normal.   The deep gray matter appears normal.  The cerebral hemispheres appear normal.   Diffusion weighted images are normal.  Susceptibility weighted images are normal.      The orbits appear normal.   The VIIth/VIIIth nerve complex appears normal.  The mastoid air cells appear normal.  The paranasal sinuses appear normal.  Flow voids are identified within the major intracerebral arteries.     After the infusion of contrast material, a normal enhancement pattern is noted.     IMPRESSION:  This is a normal MRI of the brain with and without contrast.     PATIENT SURVEYS:  Not taken   SCREENING FOR RED FLAGS:           Negative COGNITION:           Overall cognitive status: Within functional limits for tasks assessed                          SENSATION: Not tested   MUSCLE LENGTH: Hamstrings: Right 90 deg; Left 90 deg   POSTURE: Not assessed to to inability to stand unsupported              DTRs: Equal in BLEs   LUMBAR ROM: Not tested   Active  A/PROM  eval  Flexion    Extension    Right lateral flexion    Left lateral flexion    Right rotation    Left rotation     (Blank rows = not tested)   LOWER EXTREMITY ROM:   WFL through observation   Active  Right eval Left eval  Hip flexion      Hip extension      Hip abduction      Hip adduction      Hip internal rotation      Hip external rotation      Knee flexion      Knee extension      Ankle dorsiflexion      Ankle plantarflexion      Ankle inversion      Ankle eversion       (Blank rows = not tested)   LOWER EXTREMITY MMT:  Cogwheeling noted with MMT   MMT Right eval Left eval  Hip flexion 4- 4-  Hip extension      Hip abduction      Hip adduction      Hip internal rotation      Hip external rotation      Knee flexion      Knee extension 4- 4-  Ankle dorsiflexion 4- 4-  Ankle plantarflexion 4 4  Ankle inversion      Ankle eversion       (Blank rows = not tested)   LUMBAR SPECIAL TESTS:  Not applicable   FUNCTIONAL TESTS:  5 times sit to stand: 27s arms crossed   GAIT: Distance walked: 52f x2 Assistive device utilized: WEnvironmental consultant- 2 wheeled Level of assistance: Modified independence Comments: Shows decreased TKE and buckling but able to prevent fall to ground.       TODAY'S TREATMENT  [facility] Adult  PT Treatment:                                                DATE: 09/15/2022 Aquatic therapy at [address] - therapeutic pool temp 92 degrees Pt enters building  in wheelchair accompanied by mother.  Treatment took place in water 3.8 to  4 ft 8 in.feet deep depending upon activity.  Pt entered and exited the pool via stair and handrails independently with step to pattern.  Pt pain level 7/10 at initiation of water walking.  Patient entered water for aquatic therapy for first time and was introduced to principles and therapeutic effects of water as they ambulated and acclimated to pool.  Therapeutic Exercise: Walking forward/backwards/side stepping x2 laps each Runners stretch on bottom step x30" BIL Hamstring stretch on bottom step x30" BIL At edge of pool, pt performed LE exercise: Hip abd/add x20 BIL Hip ext/flex with knee straight x 20 BIL Hip Circles CC/CCW 2x10 each BIL Marching hip flexion to knee extension 2x10 BIL Hamstring curl x20 BIL Squats 2x20 Heel raises 2x20 Step ups on submerged step forward/lateral x10 each BIL Sitting on bench in water: Bicycle kicks x1' Flutter kicks x1' Scissor kicks x1'  Pt requires the buoyancy of water for active assisted exercises with buoyancy supported for strengthening and AROM exercises. Hydrostatic pressure also supports joints by unweighting joint load by at least 50 % in 3-4 feet depth water. 80% in chest to neck deep water. Water will provide assistance with movement using the current and laminar flow while the buoyancy reduces weight bearing. Pt requires the viscosity of the water for resistance with strengthening exercises.   09/04/2022: Eval      PATIENT EDUCATION:  Education details: Discussed eval findings, rehab rationale and POC and patient is in agreement  Person educated: Patient Education method: Explanation and Handouts Education comprehension: verbalized understanding and needs further education     HOME EXERCISE PROGRAM:            Access Code: [email or web address] URL: [email or web address] Date: 09/04/2022 Prepared by: Sharlynn Oliphant   Exercises - Stair Negotiation  with Single Rail Using Step-To Pattern (One Step at a Time)  - 1 x daily - 7 x weekly - Supine Heel Slide  - 1-2 x daily - 7 x weekly - 1-2 sets - 5 reps - Supine Knee Extension Strengthening  - 1-2 x daily - 7 x weekly - 1-2 sets - 5 reps - Supine Short Arc Quad  - 1-2 x daily - 7 x weekly - 1-2 sets - 5 reps - Seated Long Arc Quad  - 1-2 x daily - 7 x weekly - 1-2 sets - 5 reps - Seated Heel Toe Raises  - 1 x daily - 7 x weekly - 3 sets - 10 reps - Supine Bridge  - 1 x daily - 7 x weekly - 3 sets - 10 reps - Beginner Clam  - 1 x daily - 7 x weekly - 3 sets - 10 reps - Small Range Straight Leg Raise  - 1 x daily - 7 x weekly - 1 sets - 10 reps   ASSESSMENT:   CLINICAL IMPRESSION: Patient presents to first aquatic PT session reporting 7/10 pain in BIL LE at beginning of session. She displays forward flexed posture and lack of TKE when ambulating with SPC to  pool. Session today focused on BIL LE strengthening, gait training and general conditioning in the aquatic environment for use of buoyancy to offload joints and the viscosity of water as resistance during therapeutic exercise. She requires cues for heel strike and toe off as well as TKE and upright posture with ambulation. She displays occasional motor tics that did not impact her balance or ability to complete exercises. Patient was able to tolerate continuous exercise in the aquatic environment with no adverse effects and reports 8/10 pain and heaviness at the end of the session. Patient continues to benefit from skilled PT services on land and aquatic based and should be progressed as able to improve functional independence.    OBJECTIVE IMPAIRMENTS Abnormal gait, decreased activity tolerance, decreased balance, decreased coordination, decreased endurance, decreased knowledge of condition, decreased mobility, difficulty walking, decreased strength, impaired perceived functional ability, impaired UE functional use, postural dysfunction, obesity,  and pain.    ACTIVITY LIMITATIONS carrying, lifting, standing, squatting, stairs, and transfers   PERSONAL FACTORS Age, Behavior pattern, Fitness, Past/current experiences, and Time since onset of injury/illness/exacerbation are also affecting patient's functional outcome.    REHAB POTENTIAL: Fair based on    CLINICAL DECISION MAKING: Evolving/moderate complexity   EVALUATION COMPLEXITY: Moderate     GOALS: Goals reviewed with patient? No   SHORT TERM GOALS: Target date: 09/18/2022   Initiate aquatic PT program Baseline:TBD Goal status: MET Initiated 09/15/2022     LONG TERM GOALS: Target date: 10/02/2022   Patient to demonstrate independence in HEP  Baseline: [id number] Goal status: INITIAL   2.  Decrease 5x STS time to 25s or less Baseline: 27s Goal status: INITIAL   3.  26f ambulation with RW and S Baseline: 755fwith RW and S Goal status: INITIAL       PLAN: PT FREQUENCY: 2x/week   PT DURATION: 4 weeks   PLANNED INTERVENTIONS: Therapeutic exercises, Therapeutic activity, Neuromuscular re-education, Balance training, Gait training, Patient/Family education, Self Care, Joint mobilization, Stair training, Aquatic Therapy, and Re-evaluation.   PLAN FOR NEXT SESSION: Initiate aquatic PT for generalized strength and mobility training, gait and balance tasks,    StMargarette CanadaPTA 09/15/2022, 2:05 PM

## 2022-09-15 ENCOUNTER — Ambulatory Visit: Payer: Medicaid Other

## 2022-09-15 DIAGNOSIS — M6281 Muscle weakness (generalized): Secondary | ICD-10-CM

## 2022-09-15 DIAGNOSIS — R293 Abnormal posture: Secondary | ICD-10-CM

## 2022-09-15 DIAGNOSIS — R262 Difficulty in walking, not elsewhere classified: Secondary | ICD-10-CM

## 2022-09-21 ENCOUNTER — Ambulatory Visit: Payer: Medicaid Other | Admitting: Physical Therapy

## 2022-09-21 NOTE — Therapy (Signed)
OUTPATIENT PHYSICAL THERAPY TREATMENT NOTE   Patient Name: Sherri [name] MRN: [id number] DOB:[birth year], 21 y.o., female Today's Date: 09/22/2022  PCP: Dr. Ezequiel Essex, MD REFERRING PROVIDER: Dr. Dorcas Mcmurray, MD  END OF SESSION:   PT End of Session - 09/22/22 1237     Visit Number 8    Number of Visits 27    Date for PT Re-Evaluation 05/12/22    Authorization Time Period [employer] approved 3 visits 09/15/2022-09/28/2022    Authorization - Visit Number 3    Authorization - Number of Visits 3    PT Start Time 9323    PT Stop Time 1320    PT Time Calculation (min) 45 min    Activity Tolerance Patient tolerated treatment well;Patient limited by fatigue    Behavior During Therapy [facility] for tasks assessed/performed              Past Medical History:  Diagnosis Date    Chronic pain 02/02/2020   Abnormal vision screen 08/26/2015   Acanthosis nigricans 02/26/2020   Acid reflux    Allergy    Seasonal allergies   Anxiety    Phreesia 11/24/2020   Asthma    Atopic dermatitis 07/14/2013   Carpal tunnel syndrome, bilateral 11/21/2018   S/p bilat surgery, release   Chest pain 07/16/2019   Concussion 02/16/2021   Approx 2/15 d/t hit posterior skull against wall during tic.    Constipation 06/17/2013   Cough 01/29/2021   COVID-19 01/22/2020   Eczema    Elevated blood pressure reading 02/26/2020   Gallstones    GERD (gastroesophageal reflux disease)    Phreesia 11/24/2020   Headache    Obesity    Pain of both hip joints 11/10/2019   PCOS (polycystic ovarian syndrome) 02/26/2020   Recurrent viral infection 05/15/2022   Seasonal allergies    Tic    Past Surgical History:  Procedure Laterality Date   BILATERAL CARPAL TUNNEL RELEASE     CHOLECYSTECTOMY N/A 08/08/2017   Procedure: LAPAROSCOPIC CHOLECYSTECTOMY;  Surgeon: Stanford Scotland, MD;  Location: [facility];  Service: General;  Laterality: N/A;   UPPER GASTROINTESTINAL ENDOSCOPY     Patient Active Problem List   Diagnosis Date Noted    Muscle ache 08/14/2022   Weight loss counseling, encounter for 06/14/2022   Depression, recurrent ([place]) 05/15/2022   Gait disturbance 04/23/2022   Muscle weakness of all 4 extremities 04/13/2022   Major depression 10/07/2021   Abnormal uterine bleeding (AUB) 05/12/2021   Movement disorder 04/14/2021   Macromastia 03/16/2021   Thoracic back pain 03/16/2021   Poor appetite 03/16/2021   Hand pain, not arthralgia 02/16/2021   Healthcare maintenance 01/29/2021   Tic disorder, unspecified 08/10/2020   Hirsutism 02/26/2020   Migraine without aura and without status migrainosus, not intractable 02/10/2020   Moderate persistent asthma 02/10/2020   Irritable bowel syndrome 02/10/2020   Chronic nonintractable headache 02/02/2020   Pre-diabetes 07/16/2019   Breast pain 07/16/2019   Sleeping difficulty 07/16/2019   Menorrhagia with irregular cycle 07/16/2019   Chronic low back pain without sciatica 04/26/2017   Gastroesophageal reflux  07/29/2015   Vitamin D deficiency 07/29/2013   Morbid obesity ([place]) 07/29/2013   Asthma, chronic 07/14/2013   Allergic rhinitis 07/14/2013    REFERRING DIAG: M62.81 (ICD-10-CM) - Muscle weakness of all 4 extremities R29.898 (ICD-10-CM) - Arm weakness M79.10 (ICD-10-CM) - Muscle ache   THERAPY DIAG:  Muscle weakness (generalized)  Difficulty in walking, not elsewhere classified  Abnormal posture  Rationale for  Evaluation and Treatment Rehabilitation  PERTINENT HISTORY: Arm and leg weakness Seen in family medicine clinic 04/11/22 and evaluated for lower extremity weakness immediately after COVID infection.  Patient was utilizing wheelchair, walker, and cane at home due to weakness.  She reports 1 fall down the stairs due to weakness. At the time, weakness was thought to be mainly due to deconditioning secondary to COVID, with possible psychosomatic component as she had recently stopped her psychiatric medications.  Labs around that time were normal including  TSH, CMP, CBC with differential x2, vitamin B12, and CK.  Physician at the time discussed more ambulation and referred to PT.   Today, patient reports return of symptoms, now with upper arm weakness as well.  She does not believe she ever completely recovered from the weakness due to COVID, and she has been now worsening gradually.  She and her mom do believe that physical therapy helped a great deal in April.   Patient reports subjective leg weakness and pain when standing for long periods of time.  Also describes feeling of heaviness in the legs, like "a weight is on my legs pushing them down".  In the past couple of days, legs of hurt so bad when standing or moving that she has been relying on aids such as wheelchair, walker, and cane.  Pain acutely worsened yesterday after a full day of walking around doing errands.  Does endorse shooting pains proximally with standing, located both laterally and medially on legs.   Different from before, she is also experiencing pain in shoulders and upper arms with use, specifically reaching up overhead or trying to do her hair.  No acute changes in grip strength or sensation.   No prior imaging of back.  No recent falls.   Denies saddle anesthesia and bowel or bladder incontinence.   No known family history of rheumatoid arthritis, hypothyroidism, or other autoimmune diseases.  Maternal grandmother had similar presentation but in her 44s.  Mother and brother have degenerative disc disease.   Med review performed, only new medication is Wellbutrin.  This was started back in July by myself as an adjunct of treatment for weight loss.  Patient reports meds for schizophrenia.  I see the medications she is referring to in the chart, but there is no diagnosis of schizophrenia anywhere in her chart.  The psychiatry notes do reference bipolar disorder with psychotic features.  PERTINENT  PMH / PSH: COVID infection, prediabetes, unspecified tic disorder, migraine  without aura, IBS, moderate persistent asthma, menorrhagia, macromastia with chronic thoracic back pain, depression.  PRECAUTIONS: Fall  SUBJECTIVE: Patient reports she woke up with back pain today and that he legs continue to give her trouble.  PAIN:  Are you having pain? Yes: NPRS scale: 6/10 (9/10 LBP) Pain location: B LEs Pain description: ache Aggravating factors: weight bearing tasks Relieving factors: rest   OBJECTIVE: (objective measures completed at initial evaluation unless otherwise dated)   DIAGNOSTIC FINDINGS:  STUDY DATE: 04/23/2021 PATIENT NAME: KELENA GARROW DOB: 03/01/2001 MRN: [id number]   EXAM: MRI Brain with and without contrast   ORDERING CLINICIAN: Marcial Pacas MD, PhD CLINICAL HISTORY: 21 year old woman with movement disorder COMPARISON FILMS: None   TECHNIQUE:MRI of the brain with and without contrast was obtained utilizing 5 mm axial slices with T1, T2, T2 flair, SWI and diffusion weighted views.  T1 sagittal, T2 coronal and postcontrast views in the axial and coronal plane were obtained. CONTRAST: 20 ml [employer] IMAGING SITE: [facility], [address]   FINDINGS: On sagittal images, the spinal cord is imaged caudally to C5 and is normal in caliber.   The contents of the posterior fossa are of normal size and position.   The pituitary gland and optic chiasm appear normal.    Brain volume appears normal.   The ventricles are normal in size and without distortion.  There are no abnormal extra-axial collections of fluid.     The cerebellum and brainstem appears normal.   The deep gray matter appears normal.  The cerebral hemispheres appear normal.   Diffusion weighted images are normal.  Susceptibility weighted images are normal.      The orbits appear normal.   The VIIth/VIIIth nerve complex appears normal.  The mastoid air cells appear normal.  The paranasal sinuses appear normal.  Flow voids are identified within the major intracerebral  arteries.     After the infusion of contrast material, a normal enhancement pattern is noted.     IMPRESSION: This is a normal MRI of the brain with and without contrast.     PATIENT SURVEYS:  Not taken   SCREENING FOR RED FLAGS:           Negative COGNITION:           Overall cognitive status: Within functional limits for tasks assessed                          SENSATION: Not tested   MUSCLE LENGTH: Hamstrings: Right 90 deg; Left 90 deg   POSTURE: Not assessed to to inability to stand unsupported              DTRs: Equal in BLEs   LUMBAR ROM: Not tested   Active  A/PROM  eval  Flexion    Extension    Right lateral flexion    Left lateral flexion    Right rotation    Left rotation     (Blank rows = not tested)   LOWER EXTREMITY ROM:   WFL through observation   Active  Right eval Left eval  Hip flexion      Hip extension      Hip abduction      Hip adduction      Hip internal rotation      Hip external rotation      Knee flexion      Knee extension      Ankle dorsiflexion      Ankle plantarflexion      Ankle inversion      Ankle eversion       (Blank rows = not tested)   LOWER EXTREMITY MMT:  Cogwheeling noted with MMT   MMT Right eval Left eval  Hip flexion 4- 4-  Hip extension      Hip abduction      Hip adduction      Hip internal rotation      Hip external rotation      Knee flexion      Knee extension 4- 4-  Ankle dorsiflexion 4- 4-  Ankle plantarflexion 4 4  Ankle inversion      Ankle eversion       (Blank rows = not tested)   LUMBAR SPECIAL TESTS:  Not applicable   FUNCTIONAL TESTS:  5 times sit to stand: 27s arms crossed   GAIT: Distance walked: 76f x2 Assistive device utilized: WEnvironmental consultant- 2 wheeled Level of assistance: Modified independence Comments: Shows  decreased TKE and buckling but able to prevent fall to ground.       TODAY'S TREATMENT  [facility] Adult PT Treatment:                                                DATE:  09/22/2022 Aquatic therapy at [address] - therapeutic pool temp 92 degrees Pt enters building ambulating with SPC.  Treatment took place in water 3.8 to  4 ft 8 in.feet deep depending upon activity.  Pt entered and exited the pool via stair and handrails independently with step to pattern.  Pt pain level 6/10 BIL LE, 9/10 LBP at initiation of water walking.  Therapeutic Exercise: Walking forward/backwards/side stepping x2 laps each holding blue barbell Runners stretch on bottom step x30" BIL Hamstring stretch on bottom step x30" BIL March walk with blue barbell 2 laps Sidestepping lune walk with blue barbell x2 laps At edge of pool, pt performed LE exercise: Hip abd/add x20 BIL Hamstring curl x20 BIL Squats 2x20 Heel raises 2x20 Hip ext/flex with knee straight x 20 BIL Hip Circles CC/CCW x10 each BIL Marching hip flexion to knee extension 2x10 BIL Step ups on submerged step forward/lateral x10 each BIL Sitting on bench in water: LAQ x1' Bicycle kicks x1' Flutter kicks x1' Scissor kicks x1'  Pt requires the buoyancy of water for active assisted exercises with buoyancy supported for strengthening and AROM exercises. Hydrostatic pressure also supports joints by unweighting joint load by at least 50 % in 3-4 feet depth water. 80% in chest to neck deep water. Water will provide assistance with movement using the current and laminar flow while the buoyancy reduces weight bearing. Pt requires the viscosity of the water for resistance with strengthening exercises.  [facility] Adult PT Treatment:                                                DATE: 09/15/2022 Aquatic therapy at [address] - therapeutic pool temp 92 degrees Pt enters building in wheelchair accompanied by mother.  Treatment took place in water 3.8 to  4 ft 8 in.feet deep depending upon activity.  Pt entered and exited the pool via stair and handrails independently with step to pattern.  Pt pain  level 7/10 at initiation of water walking.  Patient entered water for aquatic therapy for first time and was introduced to principles and therapeutic effects of water as they ambulated and acclimated to pool.  Therapeutic Exercise: Walking forward/backwards/side stepping x2 laps each Runners stretch on bottom step x30" BIL Hamstring stretch on bottom step x30" BIL At edge of pool, pt performed LE exercise: Hip abd/add x20 BIL Hip ext/flex with knee straight x 20 BIL Hip Circles CC/CCW 2x10 each BIL Marching hip flexion to knee extension 2x10 BIL Hamstring curl x20 BIL Squats 2x20 Heel raises 2x20 Step ups on submerged step forward/lateral x10 each BIL Sitting on bench in water: Bicycle kicks x1' Flutter kicks x1' Scissor kicks x1'  Pt requires the buoyancy of water for active assisted exercises with buoyancy supported for strengthening and AROM exercises. Hydrostatic pressure also supports joints by unweighting joint load by at least 50 % in 3-4 feet depth water. 80% in chest  to neck deep water. Water will provide assistance with movement using the current and laminar flow while the buoyancy reduces weight bearing. Pt requires the viscosity of the water for resistance with strengthening exercises.      PATIENT EDUCATION:  Education details: Discussed eval findings, rehab rationale and POC and patient is in agreement  Person educated: Patient Education method: Explanation and Handouts Education comprehension: verbalized understanding and needs further education     HOME EXERCISE PROGRAM:            Access Code: [email or web address] URL: [email or web address] Date: 09/04/2022 Prepared by: Sharlynn Oliphant   Exercises - Stair Negotiation with Single Rail Using Step-To Pattern (One Step at a Time)  - 1 x daily - 7 x weekly - Supine Heel Slide  - 1-2 x daily - 7 x weekly - 1-2 sets - 5 reps - Supine Knee Extension Strengthening  - 1-2 x daily - 7 x weekly - 1-2 sets - 5 reps -  Supine Short Arc Quad  - 1-2 x daily - 7 x weekly - 1-2 sets - 5 reps - Seated Long Arc Quad  - 1-2 x daily - 7 x weekly - 1-2 sets - 5 reps - Seated Heel Toe Raises  - 1 x daily - 7 x weekly - 3 sets - 10 reps - Supine Bridge  - 1 x daily - 7 x weekly - 3 sets - 10 reps - Beginner Clam  - 1 x daily - 7 x weekly - 3 sets - 10 reps - Small Range Straight Leg Raise  - 1 x daily - 7 x weekly - 1 sets - 10 reps   ASSESSMENT:   CLINICAL IMPRESSION: Patient presents to aquatic PT session reporting 6/10 pain in BIL LE and 9/10 in her lower back. She ambulates into pool area with SPC instead of in [facility] today and states she has been trying to use the cane more, but that she becomes fatigued easily. Session today focused on BIL LE strengthening, gait training, and general conditioning in the aquatic environment for use of buoyancy to offload joints and the viscosity of water as resistance during therapeutic exercise. Patient was able to tolerate all prescribed exercises in the aquatic environment with no adverse effects and reports 2/10 pain in BIL LE and 9/10 pain in lower back at the end of the session. Patient continues to benefit from skilled PT services on land and aquatic based and should be progressed as able to improve functional independence.   OBJECTIVE IMPAIRMENTS Abnormal gait, decreased activity tolerance, decreased balance, decreased coordination, decreased endurance, decreased knowledge of condition, decreased mobility, difficulty walking, decreased strength, impaired perceived functional ability, impaired UE functional use, postural dysfunction, obesity, and pain.    ACTIVITY LIMITATIONS carrying, lifting, standing, squatting, stairs, and transfers   PERSONAL FACTORS Age, Behavior pattern, Fitness, Past/current experiences, and Time since onset of injury/illness/exacerbation are also affecting patient's functional outcome.    REHAB POTENTIAL: Fair based on    CLINICAL DECISION MAKING:  Evolving/moderate complexity   EVALUATION COMPLEXITY: Moderate     GOALS: Goals reviewed with patient? No   SHORT TERM GOALS: Target date: 09/18/2022   Initiate aquatic PT program Baseline:TBD Goal status: MET Initiated 09/15/2022     LONG TERM GOALS: Target date: 10/02/2022   Patient to demonstrate independence in HEP  Baseline: [id number] Goal status: INITIAL   2.  Decrease 5x STS time to 25s or less Baseline: 27s Goal status: INITIAL   3.  251f  ambulation with RW and S Baseline: 77f with RW and S Goal status: INITIAL       PLAN: PT FREQUENCY: 2x/week   PT DURATION: 4 weeks   PLANNED INTERVENTIONS: Therapeutic exercises, Therapeutic activity, Neuromuscular re-education, Balance training, Gait training, Patient/Family education, Self Care, Joint mobilization, Stair training, Aquatic Therapy, and Re-evaluation.   PLAN FOR NEXT SESSION: Initiate aquatic PT for generalized strength and mobility training, gait and balance tasks,    SMargarette Canada PTA 09/22/2022, 1:22 PM

## 2022-09-22 ENCOUNTER — Encounter: Payer: Self-pay | Admitting: Family Medicine

## 2022-09-22 ENCOUNTER — Ambulatory Visit: Payer: Medicaid Other | Attending: Family Medicine

## 2022-09-22 ENCOUNTER — Telehealth: Payer: Self-pay | Admitting: Family Medicine

## 2022-09-22 DIAGNOSIS — M6281 Muscle weakness (generalized): Secondary | ICD-10-CM | POA: Diagnosis present

## 2022-09-22 DIAGNOSIS — R293 Abnormal posture: Secondary | ICD-10-CM

## 2022-09-22 DIAGNOSIS — F339 Major depressive disorder, recurrent, unspecified: Secondary | ICD-10-CM

## 2022-09-22 DIAGNOSIS — G259 Extrapyramidal and movement disorder, unspecified: Secondary | ICD-10-CM

## 2022-09-22 DIAGNOSIS — R262 Difficulty in walking, not elsewhere classified: Secondary | ICD-10-CM | POA: Diagnosis present

## 2022-09-22 DIAGNOSIS — F959 Tic disorder, unspecified: Secondary | ICD-10-CM

## 2022-09-22 DIAGNOSIS — F319 Bipolar disorder, unspecified: Secondary | ICD-10-CM | POA: Insufficient documentation

## 2022-09-22 NOTE — Telephone Encounter (Signed)
Called patient to discuss her MyChart message regarding worsening tics and abnormal movements, reportedly causing more harm to herself and others.  Confirmed correct patient before proceeding.  In her MyChart messages she references that she has spoken with her neurologist and psychiatrist about this, but that both of them have relayed there is not much more to do.  Chart reviewed demonstrates she was last seen by neurology 06/21/2021 by Dr. Marcial Pacas at [facility].  Per the note, it appears she is diagnosed with abnormal movement consistent with a motor tic.  The note indicates neurology believes there is no treatable etiology in terms of neurological underpinnings.  Neurology's notes seem to indicate that these takes abnormal movements are not diagnosed as Tourette's but rather are more so a psychosomatic disorder or functional movement disorder due to depression and anxiety.  Per phone note 07/29/2021, neurology does not believe that she requires follow-up and would rather the Abilify prescription to be taken over by a psychiatrist.  Neurology also declined to write a letter of disability for her tic and movement disorder.  She receives psychiatric care at the [facility], last saw Burt Ek, NP, on 09/07/2022 via video visit.  This note indicates Ms. Nevils asked the psychiatrist about help with the movement disorder as she has been told by neurology "there is nothing that they can do".  At that time, patient requested Celexa to help manage, but psychiatry NP informed patient that she could not treat Tourette's.  Psychiatry NP also noted she should discuss discontinuing Wellbutrin with myself (PCP).  Increased Zoloft from 25 mg to 50 mg.  I have looked into movement disorders clinics near [place].  It appears that [place] has one such clinic, but that it specializes mainly in Parkinson's disease along with cerebellar ataxia, essential tremor, and  dystonia.  There is a movement disorder center at [facility] in [facility] which apparently can evaluate and treat for tics and Tourette's syndrome along with other movement disorders.  I will fill out the referral form and give it to Jazmin to fax over.   We will provide patient with Medicaid appointment transportation resources, as this is a barrier to care for her.  She reports because her psychiatrist at the last appointment told her she was taking too much medication, she is now cut back to Zoloft only.  She has stopped taking her Wellbutrin and her risperidone.  She now takes the trazodone nightly as needed only.  Ezequiel Essex, MD

## 2022-09-25 NOTE — Telephone Encounter (Signed)
Referral and [name] notes faxed.  Patient will need to sign a release of information with the other offices for them to release her notes to [facility].  She can also do this once she gets scheduled with them.  [name],CMA

## 2022-09-28 ENCOUNTER — Ambulatory Visit: Payer: Medicaid Other

## 2022-09-28 DIAGNOSIS — R262 Difficulty in walking, not elsewhere classified: Secondary | ICD-10-CM

## 2022-09-28 DIAGNOSIS — M6281 Muscle weakness (generalized): Secondary | ICD-10-CM | POA: Diagnosis not present

## 2022-09-28 DIAGNOSIS — R293 Abnormal posture: Secondary | ICD-10-CM

## 2022-09-28 NOTE — Therapy (Signed)
OUTPATIENT PHYSICAL THERAPY TREATMENT NOTE   Patient Name: Sherri [name] MRN: [id number] DOB:August 19, 2001, 21 y.o., female Today's Date: 09/28/2022  PCP: Dr. Ezequiel Essex, MD REFERRING PROVIDER: Dr. Dorcas Mcmurray, MD  END OF SESSION:   PT End of Session - 09/28/22 1441     Visit Number 9    Number of Visits 27    Date for PT Re-Evaluation 10/02/22    Authorization Type [facility] Medicaid    Authorization Time Period Pending additional auth 09/25/22    PT Start Time 0370    PT Stop Time 1525    PT Time Calculation (min) 40 min    Activity Tolerance Patient tolerated treatment well;Patient limited by fatigue    Behavior During Therapy [facility] for tasks assessed/performed               Past Medical History:  Diagnosis Date    Chronic pain 02/02/2020   Abnormal vision screen 08/26/2015   Acanthosis nigricans 02/26/2020   Acid reflux    Allergy    Seasonal allergies   Anxiety    Phreesia 11/24/2020   Asthma    Atopic dermatitis 07/14/2013   Carpal tunnel syndrome, bilateral 11/21/2018   S/p bilat surgery, release   Chest pain 07/16/2019   Concussion 02/16/2021   Approx 2/15 d/t hit posterior skull against wall during tic.    Constipation 06/17/2013   Cough 01/29/2021   COVID-19 01/22/2020   Eczema    Elevated blood pressure reading 02/26/2020   Gallstones    GERD (gastroesophageal reflux disease)    Phreesia 11/24/2020   Headache    Obesity    Pain of both hip joints 11/10/2019   PCOS (polycystic ovarian syndrome) 02/26/2020   Recurrent viral infection 05/15/2022   Seasonal allergies    Tic    Past Surgical History:  Procedure Laterality Date   BILATERAL CARPAL TUNNEL RELEASE     CHOLECYSTECTOMY N/A 08/08/2017   Procedure: LAPAROSCOPIC CHOLECYSTECTOMY;  Surgeon: Stanford Scotland, MD;  Location: [place];  Service: General;  Laterality: N/A;   UPPER GASTROINTESTINAL ENDOSCOPY     Patient Active Problem List   Diagnosis Date Noted   Bipolar disorder ([place]) 09/22/2022   Muscle ache  08/14/2022   Weight loss counseling, encounter for 06/14/2022   Depression, recurrent ([place]) 05/15/2022   Gait disturbance 04/23/2022   Muscle weakness of all 4 extremities 04/13/2022   Major depression 10/07/2021   Abnormal uterine bleeding (AUB) 05/12/2021   Movement disorder 04/14/2021   Macromastia 03/16/2021   Thoracic back pain 03/16/2021   Poor appetite 03/16/2021   Hand pain, not arthralgia 02/16/2021   Healthcare maintenance 01/29/2021   Tic disorder, unspecified 08/10/2020   Hirsutism 02/26/2020   Migraine without aura and without status migrainosus, not intractable 02/10/2020   Moderate persistent asthma 02/10/2020   Irritable bowel syndrome 02/10/2020   Chronic nonintractable headache 02/02/2020   Pre-diabetes 07/16/2019   Breast pain 07/16/2019   Sleeping difficulty 07/16/2019   Menorrhagia with irregular cycle 07/16/2019   Chronic low back pain without sciatica 04/26/2017   Gastroesophageal reflux  07/29/2015   Vitamin D deficiency 07/29/2013   Morbid obesity ([place]) 07/29/2013   Asthma, chronic 07/14/2013   Allergic rhinitis 07/14/2013    REFERRING DIAG: M62.81 (ICD-10-CM) - Muscle weakness of all 4 extremities R29.898 (ICD-10-CM) - Arm weakness M79.10 (ICD-10-CM) - Muscle ache   THERAPY DIAG:  Muscle weakness (generalized)  Difficulty in walking, not elsewhere classified  Abnormal posture  Rationale for Evaluation and Treatment Rehabilitation  PERTINENT HISTORY: Arm and leg weakness Seen in family medicine clinic 04/11/22 and evaluated for lower extremity weakness immediately after COVID infection.  Patient was utilizing wheelchair, walker, and cane at home due to weakness.  She reports 1 fall down the stairs due to weakness. At the time, weakness was thought to be mainly due to deconditioning secondary to COVID, with possible psychosomatic component as she had recently stopped her psychiatric medications.  Labs around that time were normal including TSH, CMP,  CBC with differential x2, vitamin B12, and CK.  Physician at the time discussed more ambulation and referred to PT.   Today, patient reports return of symptoms, now with upper arm weakness as well.  She does not believe she ever completely recovered from the weakness due to COVID, and she has been now worsening gradually.  She and her mom do believe that physical therapy helped a great deal in April.   Patient reports subjective leg weakness and pain when standing for long periods of time.  Also describes feeling of heaviness in the legs, like "a weight is on my legs pushing them down".  In the past couple of days, legs of hurt so bad when standing or moving that she has been relying on aids such as wheelchair, walker, and cane.  Pain acutely worsened yesterday after a full day of walking around doing errands.  Does endorse shooting pains proximally with standing, located both laterally and medially on legs.   Different from before, she is also experiencing pain in shoulders and upper arms with use, specifically reaching up overhead or trying to do her hair.  No acute changes in grip strength or sensation.   No prior imaging of back.  No recent falls.   Denies saddle anesthesia and bowel or bladder incontinence.   No known family history of rheumatoid arthritis, hypothyroidism, or other autoimmune diseases.  Maternal grandmother had similar presentation but in her 35s.  Mother and brother have degenerative disc disease.   Med review performed, only new medication is Wellbutrin.  This was started back in July by myself as an adjunct of treatment for weight loss.  Patient reports meds for schizophrenia.  I see the medications she is referring to in the chart, but there is no diagnosis of schizophrenia anywhere in her chart.  The psychiatry notes do reference bipolar disorder with psychotic features.  PERTINENT  PMH / PSH: COVID infection, prediabetes, unspecified tic disorder, migraine without aura,  IBS, moderate persistent asthma, menorrhagia, macromastia with chronic thoracic back pain, depression.  PRECAUTIONS: Fall  SUBJECTIVE: Patient reports continued BIL LE and lower back pain. She states she can stand for about 3 minutes at a time before she needs to sit down due to pain.   PAIN:  Are you having pain? Yes: NPRS scale: 4/10 Pain location: B LEs Pain description: ache Aggravating factors: weight bearing tasks Relieving factors: rest   OBJECTIVE: (objective measures completed at initial evaluation unless otherwise dated)   DIAGNOSTIC FINDINGS:  STUDY DATE: 04/23/2021 PATIENT NAME: Sherri [name] DOB: 12-May-2001 MRN: [id number]   EXAM: MRI Brain with and without contrast   ORDERING CLINICIAN: Marcial Pacas MD, PhD CLINICAL HISTORY: 21 year old woman with movement disorder COMPARISON FILMS: None   TECHNIQUE:MRI of the brain with and without contrast was obtained utilizing 5 mm axial slices with T1, T2, T2 flair, SWI and diffusion weighted views.  T1 sagittal, T2 coronal and postcontrast views in the axial and coronal plane were obtained. CONTRAST: 20 ml [facility]  SITE: [facility] imaging, [place].   FINDINGS: On sagittal images, the spinal cord is imaged caudally to C5 and is normal in caliber.   The contents of the posterior fossa are of normal size and position.   The pituitary gland and optic chiasm appear normal.    Brain volume appears normal.   The ventricles are normal in size and without distortion.  There are no abnormal extra-axial collections of fluid.     The cerebellum and brainstem appears normal.   The deep gray matter appears normal.  The cerebral hemispheres appear normal.   Diffusion weighted images are normal.  Susceptibility weighted images are normal.      The orbits appear normal.   The VIIth/VIIIth nerve complex appears normal.  The mastoid air cells appear normal.  The paranasal sinuses appear normal.  Flow voids are identified  within the major intracerebral arteries.     After the infusion of contrast material, a normal enhancement pattern is noted.     IMPRESSION: This is a normal MRI of the brain with and without contrast.     PATIENT SURVEYS:  Not taken   SCREENING FOR RED FLAGS:           Negative COGNITION:           Overall cognitive status: Within functional limits for tasks assessed                          SENSATION: Not tested   MUSCLE LENGTH: Hamstrings: Right 90 deg; Left 90 deg   POSTURE: Not assessed to to inability to stand unsupported              DTRs: Equal in BLEs   LUMBAR ROM: Not tested   Active  A/PROM  eval  Flexion    Extension    Right lateral flexion    Left lateral flexion    Right rotation    Left rotation     (Blank rows = not tested)   LOWER EXTREMITY ROM:   WFL through observation   Active  Right eval Left eval  Hip flexion      Hip extension      Hip abduction      Hip adduction      Hip internal rotation      Hip external rotation      Knee flexion      Knee extension      Ankle dorsiflexion      Ankle plantarflexion      Ankle inversion      Ankle eversion       (Blank rows = not tested)   LOWER EXTREMITY MMT:  Cogwheeling noted with MMT   MMT Right eval Left eval  Hip flexion 4- 4-  Hip extension      Hip abduction      Hip adduction      Hip internal rotation      Hip external rotation      Knee flexion      Knee extension 4- 4-  Ankle dorsiflexion 4- 4-  Ankle plantarflexion 4 4  Ankle inversion      Ankle eversion       (Blank rows = not tested)   LUMBAR SPECIAL TESTS:  Not applicable   FUNCTIONAL TESTS:  5 times sit to stand: 27s arms crossed   GAIT: Distance walked: 50f x2 Assistive device utilized: WEnvironmental consultant- 2 wheeled Level  of assistance: Modified independence Comments: Shows decreased TKE and buckling but able to prevent fall to ground.       TODAY'S TREATMENT  [facility] Adult PT Treatment:                                                 DATE: 09/28/2022 Therapeutic Exercise: Nustep level 5 x 5 mins Seated march 3# 2x1' LAQ 3# 2x10 Seated hamstring curl GTB 2x10 BIL Seated heel/toe raises 3x10 Supine bridge x10 Supine bridge with hip adduction ball squeeze 2x10 Supine march against GTB 2x10 BIL Supine clamshell GTB 3x10 SLR 2x10 BIL Sidelying hip abduction x10 BIL   [facility] Adult PT Treatment:                                                DATE: 09/22/2022 Aquatic therapy at [address] - therapeutic pool temp 92 degrees Pt enters building ambulating with SPC.  Treatment took place in water 3.8 to  4 ft 8 in.feet deep depending upon activity.  Pt entered and exited the pool via stair and handrails independently with step to pattern.  Pt pain level 6/10 BIL LE, 9/10 LBP at initiation of water walking.  Therapeutic Exercise: Walking forward/backwards/side stepping x2 laps each holding blue barbell Runners stretch on bottom step x30" BIL Hamstring stretch on bottom step x30" BIL March walk with blue barbell 2 laps Sidestepping lune walk with blue barbell x2 laps At edge of pool, pt performed LE exercise: Hip abd/add x20 BIL Hamstring curl x20 BIL Squats 2x20 Heel raises 2x20 Hip ext/flex with knee straight x 20 BIL Hip Circles CC/CCW x10 each BIL Marching hip flexion to knee extension 2x10 BIL Step ups on submerged step forward/lateral x10 each BIL Sitting on bench in water: LAQ x1' Bicycle kicks x1' Flutter kicks x1' Scissor kicks x1'  Pt requires the buoyancy of water for active assisted exercises with buoyancy supported for strengthening and AROM exercises. Hydrostatic pressure also supports joints by unweighting joint load by at least 50 % in 3-4 feet depth water. 80% in chest to neck deep water. Water will provide assistance with movement using the current and laminar flow while the buoyancy reduces weight bearing. Pt requires the viscosity of the water for resistance  with strengthening exercises.  [facility] Adult PT Treatment:                                                DATE: 09/15/2022 Aquatic therapy at [address] - therapeutic pool temp 92 degrees Pt enters building in wheelchair accompanied by mother.  Treatment took place in water 3.8 to  4 ft 8 in.feet deep depending upon activity.  Pt entered and exited the pool via stair and handrails independently with step to pattern.  Pt pain level 7/10 at initiation of water walking.  Patient entered water for aquatic therapy for first time and was introduced to principles and therapeutic effects of water as they ambulated and acclimated to pool.  Therapeutic Exercise: Walking forward/backwards/side stepping x2 laps each Runners stretch on bottom step x30"  BIL Hamstring stretch on bottom step x30" BIL At edge of pool, pt performed LE exercise: Hip abd/add x20 BIL Hip ext/flex with knee straight x 20 BIL Hip Circles CC/CCW 2x10 each BIL Marching hip flexion to knee extension 2x10 BIL Hamstring curl x20 BIL Squats 2x20 Heel raises 2x20 Step ups on submerged step forward/lateral x10 each BIL Sitting on bench in water: Bicycle kicks x1' Flutter kicks x1' Scissor kicks x1'  Pt requires the buoyancy of water for active assisted exercises with buoyancy supported for strengthening and AROM exercises. Hydrostatic pressure also supports joints by unweighting joint load by at least 50 % in 3-4 feet depth water. 80% in chest to neck deep water. Water will provide assistance with movement using the current and laminar flow while the buoyancy reduces weight bearing. Pt requires the viscosity of the water for resistance with strengthening exercises.      PATIENT EDUCATION:  Education details: Discussed eval findings, rehab rationale and POC and patient is in agreement  Person educated: Patient Education method: Explanation and Handouts Education comprehension: verbalized understanding and needs  further education     HOME EXERCISE PROGRAM:            Access Code: [email or web address] URL: [email or web address] Date: 09/04/2022 Prepared by: Sharlynn Oliphant   Exercises - Stair Negotiation with Single Rail Using Step-To Pattern (One Step at a Time)  - 1 x daily - 7 x weekly - Supine Heel Slide  - 1-2 x daily - 7 x weekly - 1-2 sets - 5 reps - Supine Knee Extension Strengthening  - 1-2 x daily - 7 x weekly - 1-2 sets - 5 reps - Supine Short Arc Quad  - 1-2 x daily - 7 x weekly - 1-2 sets - 5 reps - Seated Long Arc Quad  - 1-2 x daily - 7 x weekly - 1-2 sets - 5 reps - Seated Heel Toe Raises  - 1 x daily - 7 x weekly - 3 sets - 10 reps - Supine Bridge  - 1 x daily - 7 x weekly - 3 sets - 10 reps - Beginner Clam  - 1 x daily - 7 x weekly - 3 sets - 10 reps - Small Range Straight Leg Raise  - 1 x daily - 7 x weekly - 1 sets - 10 reps   ASSESSMENT:   CLINICAL IMPRESSION: Patient presents to PT with continued, though lessened, BIL LE and lower back pain. She ambulates into clinic with SPC and antalgic gait with forward flexed posture. Session today focused on BIL LE strengthening and general conditioning. Patient was able to tolerate all prescribed exercises with no adverse effects. Patient continues to benefit from skilled PT services and should be progressed as able to improve functional independence.     OBJECTIVE IMPAIRMENTS Abnormal gait, decreased activity tolerance, decreased balance, decreased coordination, decreased endurance, decreased knowledge of condition, decreased mobility, difficulty walking, decreased strength, impaired perceived functional ability, impaired UE functional use, postural dysfunction, obesity, and pain.    ACTIVITY LIMITATIONS carrying, lifting, standing, squatting, stairs, and transfers   PERSONAL FACTORS Age, Behavior pattern, Fitness, Past/current experiences, and Time since onset of injury/illness/exacerbation are also affecting patient's functional  outcome.    REHAB POTENTIAL: Fair based on    CLINICAL DECISION MAKING: Evolving/moderate complexity   EVALUATION COMPLEXITY: Moderate     GOALS: Goals reviewed with patient? No   SHORT TERM GOALS: Target date: 09/18/2022   Initiate aquatic PT program Baseline:TBD Goal status: MET Initiated 09/15/2022  LONG TERM GOALS: Target date: 10/02/2022   Patient to demonstrate independence in HEP  Baseline: [id number] Goal status: INITIAL   2.  Decrease 5x STS time to 25s or less Baseline: 27s Goal status: INITIAL   3.  23f ambulation with RW and S Baseline: 760fwith RW and S Goal status: INITIAL       PLAN: PT FREQUENCY: 2x/week   PT DURATION: 4 weeks   PLANNED INTERVENTIONS: Therapeutic exercises, Therapeutic activity, Neuromuscular re-education, Balance training, Gait training, Patient/Family education, Self Care, Joint mobilization, Stair training, Aquatic Therapy, and Re-evaluation.   PLAN FOR NEXT SESSION: Initiate aquatic PT for generalized strength and mobility training, gait and balance tasks,    StMargarette CanadaPTA 09/28/2022, 3:25 PM

## 2022-10-03 ENCOUNTER — Ambulatory Visit: Payer: Medicaid Other | Admitting: Family Medicine

## 2022-10-04 ENCOUNTER — Ambulatory Visit: Payer: Medicaid Other

## 2022-10-04 DIAGNOSIS — R293 Abnormal posture: Secondary | ICD-10-CM

## 2022-10-04 DIAGNOSIS — M6281 Muscle weakness (generalized): Secondary | ICD-10-CM

## 2022-10-04 DIAGNOSIS — R262 Difficulty in walking, not elsewhere classified: Secondary | ICD-10-CM

## 2022-10-04 NOTE — Therapy (Signed)
OUTPATIENT PHYSICAL THERAPY TREATMENT NOTE   Patient Name: Sherri [name] MRN: [id number] DOB:09-02-2001, 21 y.o., female Today's Date: 10/04/2022  PCP: Dr. Ezequiel Essex, MD REFERRING PROVIDER: Dr. Dorcas Mcmurray, MD  END OF SESSION:   PT End of Session - 10/04/22 1313     Visit Number 5    Number of Visits 8    Date for PT Re-Evaluation 11/04/22    Authorization Type [employer]    PT Start Time 1315    PT Stop Time 4098    PT Time Calculation (min) 40 min    Activity Tolerance Patient tolerated treatment well;Patient limited by fatigue    Behavior During Therapy [facility] for tasks assessed/performed                Past Medical History:  Diagnosis Date    Chronic pain 02/02/2020   Abnormal vision screen 08/26/2015   Acanthosis nigricans 02/26/2020   Acid reflux    Allergy    Seasonal allergies   Anxiety    Phreesia 11/24/2020   Asthma    Atopic dermatitis 07/14/2013   Carpal tunnel syndrome, bilateral 11/21/2018   S/p bilat surgery, release   Chest pain 07/16/2019   Concussion 02/16/2021   Approx 2/15 d/t hit posterior skull against wall during tic.    Constipation 06/17/2013   Cough 01/29/2021   COVID-19 01/22/2020   Eczema    Elevated blood pressure reading 02/26/2020   Gallstones    GERD (gastroesophageal reflux disease)    Phreesia 11/24/2020   Headache    Obesity    Pain of both hip joints 11/10/2019   PCOS (polycystic ovarian syndrome) 02/26/2020   Recurrent viral infection 05/15/2022   Seasonal allergies    Tic    Past Surgical History:  Procedure Laterality Date   BILATERAL CARPAL TUNNEL RELEASE     CHOLECYSTECTOMY N/A 08/08/2017   Procedure: LAPAROSCOPIC CHOLECYSTECTOMY;  Surgeon: Stanford Scotland, MD;  Location: [place];  Service: General;  Laterality: N/A;   UPPER GASTROINTESTINAL ENDOSCOPY     Patient Active Problem List   Diagnosis Date Noted   Bipolar disorder ([place]) 09/22/2022   Muscle ache 08/14/2022   Weight loss counseling, encounter for  06/14/2022   Depression, recurrent ([place]) 05/15/2022   Gait disturbance 04/23/2022   Muscle weakness of all 4 extremities 04/13/2022   Major depression 10/07/2021   Abnormal uterine bleeding (AUB) 05/12/2021   Movement disorder 04/14/2021   Macromastia 03/16/2021   Thoracic back pain 03/16/2021   Poor appetite 03/16/2021   Hand pain, not arthralgia 02/16/2021   Healthcare maintenance 01/29/2021   Tic disorder, unspecified 08/10/2020   Hirsutism 02/26/2020   Migraine without aura and without status migrainosus, not intractable 02/10/2020   Moderate persistent asthma 02/10/2020   Irritable bowel syndrome 02/10/2020   Chronic nonintractable headache 02/02/2020   Pre-diabetes 07/16/2019   Breast pain 07/16/2019   Sleeping difficulty 07/16/2019   Menorrhagia with irregular cycle 07/16/2019   Chronic low back pain without sciatica 04/26/2017   Gastroesophageal reflux  07/29/2015   Vitamin D deficiency 07/29/2013   Morbid obesity ([place]) 07/29/2013   Asthma, chronic 07/14/2013   Allergic rhinitis 07/14/2013    REFERRING DIAG: M62.81 (ICD-10-CM) - Muscle weakness of all 4 extremities R29.898 (ICD-10-CM) - Arm weakness M79.10 (ICD-10-CM) - Muscle ache   THERAPY DIAG:  Muscle weakness (generalized)  Difficulty in walking, not elsewhere classified  Abnormal posture  Rationale for Evaluation and Treatment Rehabilitation  PERTINENT HISTORY: Arm and leg weakness Seen in  family medicine clinic 04/11/22 and evaluated for lower extremity weakness immediately after COVID infection.  Patient was utilizing wheelchair, walker, and cane at home due to weakness.  She reports 1 fall down the stairs due to weakness. At the time, weakness was thought to be mainly due to deconditioning secondary to COVID, with possible psychosomatic component as she had recently stopped her psychiatric medications.  Labs around that time were normal including TSH, CMP, CBC with differential x2, vitamin B12, and CK.   Physician at the time discussed more ambulation and referred to PT.   Today, patient reports return of symptoms, now with upper arm weakness as well.  She does not believe she ever completely recovered from the weakness due to COVID, and she has been now worsening gradually.  She and her mom do believe that physical therapy helped a great deal in April.   Patient reports subjective leg weakness and pain when standing for long periods of time.  Also describes feeling of heaviness in the legs, like "a weight is on my legs pushing them down".  In the past couple of days, legs of hurt so bad when standing or moving that she has been relying on aids such as wheelchair, walker, and cane.  Pain acutely worsened yesterday after a full day of walking around doing errands.  Does endorse shooting pains proximally with standing, located both laterally and medially on legs.   Different from before, she is also experiencing pain in shoulders and upper arms with use, specifically reaching up overhead or trying to do her hair.  No acute changes in grip strength or sensation.   No prior imaging of back.  No recent falls.   Denies saddle anesthesia and bowel or bladder incontinence.   No known family history of rheumatoid arthritis, hypothyroidism, or other autoimmune diseases.  Maternal grandmother had similar presentation but in her 82s.  Mother and brother have degenerative disc disease.   Med review performed, only new medication is Wellbutrin.  This was started back in July by myself as an adjunct of treatment for weight loss.  Patient reports meds for schizophrenia.  I see the medications she is referring to in the chart, but there is no diagnosis of schizophrenia anywhere in her chart.  The psychiatry notes do reference bipolar disorder with psychotic features.  PERTINENT  PMH / PSH: COVID infection, prediabetes, unspecified tic disorder, migraine without aura, IBS, moderate persistent asthma, menorrhagia,  macromastia with chronic thoracic back pain, depression.  PRECAUTIONS: Fall  SUBJECTIVE: Reports continued low back pain limiting her ability to perform ADLs.  PAIN:  Are you having pain? Yes: NPRS scale: 4/10 Pain location: B LEs Pain description: ache Aggravating factors: weight bearing tasks Relieving factors: rest   OBJECTIVE: (objective measures completed at initial evaluation unless otherwise dated)   DIAGNOSTIC FINDINGS:  STUDY DATE: 04/23/2021 PATIENT NAME: Sherri [name] DOB: 11/15/2001 MRN: [id number]   EXAM: MRI Brain with and without contrast   ORDERING CLINICIAN: Marcial Pacas MD, PhD CLINICAL HISTORY: 21 year old woman with movement disorder COMPARISON FILMS: None   TECHNIQUE:MRI of the brain with and without contrast was obtained utilizing 5 mm axial slices with T1, T2, T2 flair, SWI and diffusion weighted views.  T1 sagittal, T2 coronal and postcontrast views in the axial and coronal plane were obtained. CONTRAST: 20 ml [employer] IMAGING SITE: [facility], [place].   FINDINGS: On sagittal images, the spinal cord is imaged caudally to C5 and is normal in caliber.  The contents of the posterior fossa are of normal size and position.   The pituitary gland and optic chiasm appear normal.    Brain volume appears normal.   The ventricles are normal in size and without distortion.  There are no abnormal extra-axial collections of fluid.     The cerebellum and brainstem appears normal.   The deep gray matter appears normal.  The cerebral hemispheres appear normal.   Diffusion weighted images are normal.  Susceptibility weighted images are normal.      The orbits appear normal.   The VIIth/VIIIth nerve complex appears normal.  The mastoid air cells appear normal.  The paranasal sinuses appear normal.  Flow voids are identified within the major intracerebral arteries.     After the infusion of contrast material, a normal enhancement pattern is noted.      IMPRESSION: This is a normal MRI of the brain with and without contrast.     PATIENT SURVEYS:  Not taken   SCREENING FOR RED FLAGS:           Negative  COGNITION:           Overall cognitive status: Within functional limits for tasks assessed                          SENSATION: Not tested   MUSCLE LENGTH: Hamstrings: Right 90 deg; Left 90 deg   POSTURE: Not assessed to to inability to stand unsupported                 DTRs: Equal in BLEs   LUMBAR ROM: Not tested   Active  A/PROM  eval  Flexion    Extension    Right lateral flexion    Left lateral flexion    Right rotation    Left rotation     (Blank rows = not tested)   LOWER EXTREMITY ROM:   WFL through observation   Active  Right eval Left eval  Hip flexion      Hip extension      Hip abduction      Hip adduction      Hip internal rotation      Hip external rotation      Knee flexion      Knee extension      Ankle dorsiflexion      Ankle plantarflexion      Ankle inversion      Ankle eversion       (Blank rows = not tested)   LOWER EXTREMITY MMT:  Cogwheeling noted with MMT   MMT Right eval Left eval  Hip flexion 4- 4-  Hip extension      Hip abduction      Hip adduction      Hip internal rotation      Hip external rotation      Knee flexion      Knee extension 4- 4-  Ankle dorsiflexion 4- 4-  Ankle plantarflexion 4 4  Ankle inversion      Ankle eversion       (Blank rows = not tested)   LUMBAR SPECIAL TESTS:  Not applicable   FUNCTIONAL TESTS:  5 times sit to stand: 27s arms crossed   GAIT: Distance walked: 54f x2 Assistive device utilized: WEnvironmental consultant- 2 wheeled Level of assistance: Modified independence Comments: Shows decreased TKE and buckling but able to prevent fall to ground.       TODAY'S  TREATMENT  [facility] Adult PT Treatment:                                                DATE: 10/04/22 Therapeutic Exercise: Nustep level 2 x 8 mins Bridge w/ball 15x Abduction in  sidelie 2# 15/15 FAQs w/ball 15x SLRs 2# 15/15 SAQs 2# 15/15 STS w/OH press 10x PF against wall 15x Marching against wall 15/15 Step ups fwd 4 in 10/10 with counter support Lateral step up 4 in 10/01 with counter support   [facility] Adult PT Treatment:                                                DATE: 09/28/2022 Therapeutic Exercise: Nustep level 5 x 5 mins Seated march 3# 2x1' LAQ 3# 2x10 Seated hamstring curl GTB 2x10 BIL Seated heel/toe raises 3x10 Supine bridge x10 Supine bridge with hip adduction ball squeeze 2x10 Supine march against GTB 2x10 BIL Supine clamshell GTB 3x10 SLR 2x10 BIL Sidelying hip abduction x10 BIL   [facility] Adult PT Treatment:                                                DATE: 09/22/2022 Aquatic therapy at [address] - therapeutic pool temp 92 degrees Pt enters building ambulating with SPC.  Treatment took place in water 3.8 to  4 ft 8 in.feet deep depending upon activity.  Pt entered and exited the pool via stair and handrails independently with step to pattern.  Pt pain level 6/10 BIL LE, 9/10 LBP at initiation of water walking.  Therapeutic Exercise: Walking forward/backwards/side stepping x2 laps each holding blue barbell Runners stretch on bottom step x30" BIL Hamstring stretch on bottom step x30" BIL March walk with blue barbell 2 laps Sidestepping lune walk with blue barbell x2 laps At edge of pool, pt performed LE exercise: Hip abd/add x20 BIL Hamstring curl x20 BIL Squats 2x20 Heel raises 2x20 Hip ext/flex with knee straight x 20 BIL Hip Circles CC/CCW x10 each BIL Marching hip flexion to knee extension 2x10 BIL Step ups on submerged step forward/lateral x10 each BIL Sitting on bench in water: LAQ x1' Bicycle kicks x1' Flutter kicks x1' Scissor kicks x1'  Pt requires the buoyancy of water for active assisted exercises with buoyancy supported for strengthening and AROM exercises. Hydrostatic pressure also supports  joints by unweighting joint load by at least 50 % in 3-4 feet depth water. 80% in chest to neck deep water. Water will provide assistance with movement using the current and laminar flow while the buoyancy reduces weight bearing. Pt requires the viscosity of the water for resistance with strengthening exercises.  [facility] Adult PT Treatment:                                                DATE: 09/15/2022 Aquatic therapy at [address] - therapeutic pool temp 92 degrees Pt enters building in wheelchair accompanied by  mother.  Treatment took place in water 3.8 to  4 ft 8 in.feet deep depending upon activity.  Pt entered and exited the pool via stair and handrails independently with step to pattern.  Pt pain level 7/10 at initiation of water walking.  Patient entered water for aquatic therapy for first time and was introduced to principles and therapeutic effects of water as they ambulated and acclimated to pool.  Therapeutic Exercise: Walking forward/backwards/side stepping x2 laps each Runners stretch on bottom step x30" BIL Hamstring stretch on bottom step x30" BIL At edge of pool, pt performed LE exercise: Hip abd/add x20 BIL Hip ext/flex with knee straight x 20 BIL Hip Circles CC/CCW 2x10 each BIL Marching hip flexion to knee extension 2x10 BIL Hamstring curl x20 BIL Squats 2x20 Heel raises 2x20 Step ups on submerged step forward/lateral x10 each BIL Sitting on bench in water: Bicycle kicks x1' Flutter kicks x1' Scissor kicks x1'  Pt requires the buoyancy of water for active assisted exercises with buoyancy supported for strengthening and AROM exercises. Hydrostatic pressure also supports joints by unweighting joint load by at least 50 % in 3-4 feet depth water. 80% in chest to neck deep water. Water will provide assistance with movement using the current and laminar flow while the buoyancy reduces weight bearing. Pt requires the viscosity of the water for resistance with  strengthening exercises.      PATIENT EDUCATION:  Education details: Discussed eval findings, rehab rationale and POC and patient is in agreement  Person educated: Patient Education method: Explanation and Handouts Education comprehension: verbalized understanding and needs further education     HOME EXERCISE PROGRAM:            Access Code: [email or web address] URL: [email or web address] Date: 09/04/2022 Prepared by: Sharlynn Oliphant   Exercises - Stair Negotiation with Single Rail Using Step-To Pattern (One Step at a Time)  - 1 x daily - 7 x weekly - Supine Heel Slide  - 1-2 x daily - 7 x weekly - 1-2 sets - 5 reps - Supine Knee Extension Strengthening  - 1-2 x daily - 7 x weekly - 1-2 sets - 5 reps - Supine Short Arc Quad  - 1-2 x daily - 7 x weekly - 1-2 sets - 5 reps - Seated Long Arc Quad  - 1-2 x daily - 7 x weekly - 1-2 sets - 5 reps - Seated Heel Toe Raises  - 1 x daily - 7 x weekly - 3 sets - 10 reps - Supine Bridge  - 1 x daily - 7 x weekly - 3 sets - 10 reps - Beginner Clam  - 1 x daily - 7 x weekly - 3 sets - 10 reps - Small Range Straight Leg Raise  - 1 x daily - 7 x weekly - 1 sets - 10 reps   ASSESSMENT:   CLINICAL IMPRESSION: Patient ambulating with cane showing circumducted gait and lack of TKE.  During exercises, able to demo full TKE as well as abduct against resistance.  Advanced to stepping activities using counter for balance only as patient did not need UE support to negotiate 4 in step.  Recommend continue land and aquatic based sessions with goal of transitioning to potential [employer].     OBJECTIVE IMPAIRMENTS Abnormal gait, decreased activity tolerance, decreased balance, decreased coordination, decreased endurance, decreased knowledge of condition, decreased mobility, difficulty walking, decreased strength, impaired perceived functional ability, impaired UE functional use, postural dysfunction, obesity, and pain.    ACTIVITY  LIMITATIONS  carrying, lifting, standing, squatting, stairs, and transfers   PERSONAL FACTORS Age, Behavior pattern, Fitness, Past/current experiences, and Time since onset of injury/illness/exacerbation are also affecting patient's functional outcome.    REHAB POTENTIAL: Fair based on    CLINICAL DECISION MAKING: Evolving/moderate complexity   EVALUATION COMPLEXITY: Moderate     GOALS: Goals reviewed with patient? No   SHORT TERM GOALS: Target date: 09/18/2022   Initiate aquatic PT program Baseline:TBD Goal status: MET Initiated 09/15/2022     LONG TERM GOALS: Target date: 10/02/2022   Patient to demonstrate independence in HEP  Baseline: [id number] Goal status: Met   2.  Decrease 5x STS time to 25s or less Baseline: 27s Goal status: Ongoing   3.  238f ambulation with RW and S Baseline: 747fwith RW and S; 10/04/22 7536f with SPC Goal status: INITIAL       PLAN: PT FREQUENCY: 2x/week   PT DURATION: 4 weeks   PLANNED INTERVENTIONS: Therapeutic exercises, Therapeutic activity, Neuromuscular re-education, Balance training, Gait training, Patient/Family education, Self Care, Joint mobilization, Stair training, Aquatic Therapy, and Re-evaluation.   PLAN FOR NEXT SESSION: Initiate aquatic PT for generalized strength and mobility training, gait and balance tasks,    JefLanice ShirtsT 10/04/2022, 2:01 PM

## 2022-10-05 NOTE — Therapy (Signed)
OUTPATIENT PHYSICAL THERAPY TREATMENT NOTE   Patient Name: EVERLY RUBALCAVA MRN: [id number] DOB:09/28/2001, 21 y.o., female Today's Date: 10/06/2022  PCP: Dr. Ezequiel Essex, MD REFERRING PROVIDER: Dr. Dorcas Mcmurray, MD  END OF SESSION:   PT End of Session - 10/06/22 1318     Visit Number 6    Number of Visits 8    Date for PT Re-Evaluation 11/04/22    Authorization Type [facility] Medicaid    Authorization Time Period 8 visits 09/29/22-10/26/22    Authorization - Visit Number 5    Authorization - Number of Visits 11    PT Start Time 5188    PT Stop Time 1400    PT Time Calculation (min) 45 min    Activity Tolerance Patient tolerated treatment well;Patient limited by fatigue    Behavior During Therapy [facility] for tasks assessed/performed                 Past Medical History:  Diagnosis Date    Chronic pain 02/02/2020   Abnormal vision screen 08/26/2015   Acanthosis nigricans 02/26/2020   Acid reflux    Allergy    Seasonal allergies   Anxiety    Phreesia 11/24/2020   Asthma    Atopic dermatitis 07/14/2013   Carpal tunnel syndrome, bilateral 11/21/2018   S/p bilat surgery, release   Chest pain 07/16/2019   Concussion 02/16/2021   Approx 2/15 d/t hit posterior skull against wall during tic.    Constipation 06/17/2013   Cough 01/29/2021   COVID-19 01/22/2020   Eczema    Elevated blood pressure reading 02/26/2020   Gallstones    GERD (gastroesophageal reflux disease)    Phreesia 11/24/2020   Headache    Obesity    Pain of both hip joints 11/10/2019   PCOS (polycystic ovarian syndrome) 02/26/2020   Recurrent viral infection 05/15/2022   Seasonal allergies    Tic    Past Surgical History:  Procedure Laterality Date   BILATERAL CARPAL TUNNEL RELEASE     CHOLECYSTECTOMY N/A 08/08/2017   Procedure: LAPAROSCOPIC CHOLECYSTECTOMY;  Surgeon: Stanford Scotland, MD;  Location: [facility];  Service: General;  Laterality: N/A;   UPPER GASTROINTESTINAL ENDOSCOPY     Patient Active Problem List    Diagnosis Date Noted   Bipolar disorder ([place]) 09/22/2022   Muscle ache 08/14/2022   Weight loss counseling, encounter for 06/14/2022   Depression, recurrent ([place]) 05/15/2022   Gait disturbance 04/23/2022   Muscle weakness of all 4 extremities 04/13/2022   Major depression 10/07/2021   Abnormal uterine bleeding (AUB) 05/12/2021   Movement disorder 04/14/2021   Macromastia 03/16/2021   Thoracic back pain 03/16/2021   Poor appetite 03/16/2021   Hand pain, not arthralgia 02/16/2021   Healthcare maintenance 01/29/2021   Tic disorder, unspecified 08/10/2020   Hirsutism 02/26/2020   Migraine without aura and without status migrainosus, not intractable 02/10/2020   Moderate persistent asthma 02/10/2020   Irritable bowel syndrome 02/10/2020   Chronic nonintractable headache 02/02/2020   Pre-diabetes 07/16/2019   Breast pain 07/16/2019   Sleeping difficulty 07/16/2019   Menorrhagia with irregular cycle 07/16/2019   Chronic low back pain without sciatica 04/26/2017   Gastroesophageal reflux  07/29/2015   Vitamin D deficiency 07/29/2013   Morbid obesity ([place]) 07/29/2013   Asthma, chronic 07/14/2013   Allergic rhinitis 07/14/2013    REFERRING DIAG: M62.81 (ICD-10-CM) - Muscle weakness of all 4 extremities R29.898 (ICD-10-CM) - Arm weakness M79.10 (ICD-10-CM) - Muscle ache   THERAPY DIAG:  Muscle weakness (  generalized)  Difficulty in walking, not elsewhere classified  Abnormal posture  Rationale for Evaluation and Treatment Rehabilitation  PERTINENT HISTORY: Arm and leg weakness Seen in family medicine clinic 04/11/22 and evaluated for lower extremity weakness immediately after COVID infection.  Patient was utilizing wheelchair, walker, and cane at home due to weakness.  She reports 1 fall down the stairs due to weakness. At the time, weakness was thought to be mainly due to deconditioning secondary to COVID, with possible psychosomatic component as she had recently stopped her  psychiatric medications.  Labs around that time were normal including TSH, CMP, CBC with differential x2, vitamin B12, and CK.  Physician at the time discussed more ambulation and referred to PT.   Today, patient reports return of symptoms, now with upper arm weakness as well.  She does not believe she ever completely recovered from the weakness due to COVID, and she has been now worsening gradually.  She and her mom do believe that physical therapy helped a great deal in April.   Patient reports subjective leg weakness and pain when standing for long periods of time.  Also describes feeling of heaviness in the legs, like "a weight is on my legs pushing them down".  In the past couple of days, legs of hurt so bad when standing or moving that she has been relying on aids such as wheelchair, walker, and cane.  Pain acutely worsened yesterday after a full day of walking around doing errands.  Does endorse shooting pains proximally with standing, located both laterally and medially on legs.   Different from before, she is also experiencing pain in shoulders and upper arms with use, specifically reaching up overhead or trying to do her hair.  No acute changes in grip strength or sensation.   No prior imaging of back.  No recent falls.   Denies saddle anesthesia and bowel or bladder incontinence.   No known family history of rheumatoid arthritis, hypothyroidism, or other autoimmune diseases.  Maternal grandmother had similar presentation but in her 67s.  Mother and brother have degenerative disc disease.   Med review performed, only new medication is Wellbutrin.  This was started back in July by myself as an adjunct of treatment for weight loss.  Patient reports meds for schizophrenia.  I see the medications she is referring to in the chart, but there is no diagnosis of schizophrenia anywhere in her chart.  The psychiatry notes do reference bipolar disorder with psychotic features.  PERTINENT  PMH / PSH:  COVID infection, prediabetes, unspecified tic disorder, migraine without aura, IBS, moderate persistent asthma, menorrhagia, macromastia with chronic thoracic back pain, depression.  PRECAUTIONS: Fall  SUBJECTIVE: Patient reports soreness in her calves but improved lower back and knee pain today.  PAIN:  Are you having pain? Yes: NPRS scale: 6/10 Pain location: B LEs Pain description: ache Aggravating factors: weight bearing tasks Relieving factors: rest   OBJECTIVE: (objective measures completed at initial evaluation unless otherwise dated)   DIAGNOSTIC FINDINGS:  STUDY DATE: 04/23/2021 PATIENT NAME: YOKO MCGAHEE DOB: 26-Oct-2001 MRN: [id number]   EXAM: MRI Brain with and without contrast   ORDERING CLINICIAN: Marcial Pacas MD, PhD CLINICAL HISTORY: 21 year old woman with movement disorder COMPARISON FILMS: None   TECHNIQUE:MRI of the brain with and without contrast was obtained utilizing 5 mm axial slices with T1, T2, T2 flair, SWI and diffusion weighted views.  T1 sagittal, T2 coronal and postcontrast views in the axial and coronal plane were obtained. CONTRAST: 20 ml  [facility] IMAGING SITE: [facility], [place].   FINDINGS: On sagittal images, the spinal cord is imaged caudally to C5 and is normal in caliber.   The contents of the posterior fossa are of normal size and position.   The pituitary gland and optic chiasm appear normal.    Brain volume appears normal.   The ventricles are normal in size and without distortion.  There are no abnormal extra-axial collections of fluid.     The cerebellum and brainstem appears normal.   The deep gray matter appears normal.  The cerebral hemispheres appear normal.   Diffusion weighted images are normal.  Susceptibility weighted images are normal.      The orbits appear normal.   The VIIth/VIIIth nerve complex appears normal.  The mastoid air cells appear normal.  The paranasal sinuses appear normal.  Flow voids are  identified within the major intracerebral arteries.     After the infusion of contrast material, a normal enhancement pattern is noted.     IMPRESSION: This is a normal MRI of the brain with and without contrast.     PATIENT SURVEYS:  Not taken   SCREENING FOR RED FLAGS:           Negative  COGNITION:           Overall cognitive status: Within functional limits for tasks assessed                          SENSATION: Not tested   MUSCLE LENGTH: Hamstrings: Right 90 deg; Left 90 deg   POSTURE: Not assessed to to inability to stand unsupported                 DTRs: Equal in BLEs   LUMBAR ROM: Not tested   Active  A/PROM  eval  Flexion    Extension    Right lateral flexion    Left lateral flexion    Right rotation    Left rotation     (Blank rows = not tested)   LOWER EXTREMITY ROM:   WFL through observation   Active  Right eval Left eval  Hip flexion      Hip extension      Hip abduction      Hip adduction      Hip internal rotation      Hip external rotation      Knee flexion      Knee extension      Ankle dorsiflexion      Ankle plantarflexion      Ankle inversion      Ankle eversion       (Blank rows = not tested)   LOWER EXTREMITY MMT:  Cogwheeling noted with MMT   MMT Right eval Left eval  Hip flexion 4- 4-  Hip extension      Hip abduction      Hip adduction      Hip internal rotation      Hip external rotation      Knee flexion      Knee extension 4- 4-  Ankle dorsiflexion 4- 4-  Ankle plantarflexion 4 4  Ankle inversion      Ankle eversion       (Blank rows = not tested)   LUMBAR SPECIAL TESTS:  Not applicable   FUNCTIONAL TESTS:  5 times sit to stand: 27s arms crossed   GAIT: Distance walked: 8ft x2 Assistive device  utilized: Environmental consultant - 2 wheeled Level of assistance: Modified independence Comments: Shows decreased TKE and buckling but able to prevent fall to ground.       TODAY'S TREATMENT  [facility] Adult PT Treatment:                                                 DATE: 10/06/22  Aquatic therapy at [address] - therapeutic pool temp 92 degrees Pt enters building ambulating with SPC.  Treatment took place in water 3.8 to  4 ft 8 in.feet deep depending upon activity.  Pt entered and exited the pool via stair and handrails independently with step to pattern.  Pt pain level 6/10 BIL LE, 6/10 LBP at initiation of water walking.  Therapeutic Exercise: Walking forward/backwards/side stepping x2 laps each holding blue barbell and changing to yellow noodle then yellow kickboard for less UE support March walk with blue square noodle x 1 lap Forward lunge walk holding blue square noodle x 1 lap Side stepping with rainbow DB shoulder abd/adduction x2 laps Standing thoracic rotation holding blue square noodle x1' At edge of pool, pt performed LE exercise: Hip abd/add x20 BIL Hamstring curl x20 BIL Squats 2x20 Hip ext/flex with knee straight x 20 BIL Hip Circles CC/CCW x10 each BIL Marching hip flexion to knee extension 2x10 BIL Step ups on submerged step forward/lateral x10 each BIL Sitting on bench in water: LAQ x1' Bicycle kicks x1' Flutter kicks x1' Scissor kicks x1'  Pt requires the buoyancy of water for active assisted exercises with buoyancy supported for strengthening and AROM exercises. Hydrostatic pressure also supports joints by unweighting joint load by at least 50 % in 3-4 feet depth water. 80% in chest to neck deep water. Water will provide assistance with movement using the current and laminar flow while the buoyancy reduces weight bearing. Pt requires the viscosity of the water for resistance with strengthening exercises.   [facility] Adult PT Treatment:                                                DATE: 10/04/22 Therapeutic Exercise: Nustep level 2 x 8 mins Bridge w/ball 15x Abduction in sidelie 2# 15/15 FAQs w/ball 15x SLRs 2# 15/15 SAQs 2# 15/15 STS w/OH press 10x PF against  wall 15x Marching against wall 15/15 Step ups fwd 4 in 10/10 with counter support Lateral step up 4 in 10/01 with counter support   [facility] Adult PT Treatment:                                                DATE: 09/28/2022 Therapeutic Exercise: Nustep level 5 x 5 mins Seated march 3# 2x1' LAQ 3# 2x10 Seated hamstring curl GTB 2x10 BIL Seated heel/toe raises 3x10 Supine bridge x10 Supine bridge with hip adduction ball squeeze 2x10 Supine march against GTB 2x10 BIL Supine clamshell GTB 3x10 SLR 2x10 BIL Sidelying hip abduction x10 BIL   PATIENT EDUCATION:  Education details: Discussed eval findings, rehab rationale and POC and patient is in agreement  Person educated: Patient Education method: Explanation and  Handouts Education comprehension: verbalized understanding and needs further education     HOME EXERCISE PROGRAM:            Access Code: [email or web address] URL: [email or web address] Date: 09/04/2022 Prepared by: Sharlynn Oliphant   Exercises - Stair Negotiation with Single Rail Using Step-To Pattern (One Step at a Time)  - 1 x daily - 7 x weekly - Supine Heel Slide  - 1-2 x daily - 7 x weekly - 1-2 sets - 5 reps - Supine Knee Extension Strengthening  - 1-2 x daily - 7 x weekly - 1-2 sets - 5 reps - Supine Short Arc Quad  - 1-2 x daily - 7 x weekly - 1-2 sets - 5 reps - Seated Long Arc Quad  - 1-2 x daily - 7 x weekly - 1-2 sets - 5 reps - Seated Heel Toe Raises  - 1 x daily - 7 x weekly - 3 sets - 10 reps - Supine Bridge  - 1 x daily - 7 x weekly - 3 sets - 10 reps - Beginner Clam  - 1 x daily - 7 x weekly - 3 sets - 10 reps - Small Range Straight Leg Raise  - 1 x daily - 7 x weekly - 1 sets - 10 reps   ASSESSMENT:   CLINICAL IMPRESSION: Patient presents to aquatic PT session stating she has some soreness from her last land session, particularly in her calves but that her lower back is feeling better today. Session today focused on BIL LE strengthening and general  conditioning in the aquatic environment for use of buoyancy to offload joints and the viscosity of water as resistance during therapeutic exercise. Patient was able to tolerate all prescribed exercises in the aquatic environment with no adverse effects. Patient continues to benefit from skilled PT services on land and aquatic based and should be progressed as able to improve functional independence.    OBJECTIVE IMPAIRMENTS Abnormal gait, decreased activity tolerance, decreased balance, decreased coordination, decreased endurance, decreased knowledge of condition, decreased mobility, difficulty walking, decreased strength, impaired perceived functional ability, impaired UE functional use, postural dysfunction, obesity, and pain.    ACTIVITY LIMITATIONS carrying, lifting, standing, squatting, stairs, and transfers   PERSONAL FACTORS Age, Behavior pattern, Fitness, Past/current experiences, and Time since onset of injury/illness/exacerbation are also affecting patient's functional outcome.    REHAB POTENTIAL: Fair based on    CLINICAL DECISION MAKING: Evolving/moderate complexity   EVALUATION COMPLEXITY: Moderate     GOALS: Goals reviewed with patient? No   SHORT TERM GOALS: Target date: 09/18/2022   Initiate aquatic PT program Baseline:TBD Goal status: MET Initiated 09/15/2022     LONG TERM GOALS: Target date: 10/02/2022   Patient to demonstrate independence in HEP  Baseline: [id number] Goal status: Met   2.  Decrease 5x STS time to 25s or less Baseline: 27s Goal status: Ongoing   3.  246f ambulation with RW and S Baseline: 740fwith RW and S; 10/04/22 7564f with SPC Goal status: INITIAL       PLAN: PT FREQUENCY: 2x/week   PT DURATION: 4 weeks   PLANNED INTERVENTIONS: Therapeutic exercises, Therapeutic activity, Neuromuscular re-education, Balance training, Gait training, Patient/Family education, Self Care, Joint mobilization, Stair training, Aquatic Therapy, and  Re-evaluation.   PLAN FOR NEXT SESSION: Initiate aquatic PT for generalized strength and mobility training, gait and balance tasks,    SteMargarette CanadaTA 10/06/2022, 1:52 PM

## 2022-10-06 ENCOUNTER — Ambulatory Visit: Payer: Medicaid Other

## 2022-10-06 DIAGNOSIS — M6281 Muscle weakness (generalized): Secondary | ICD-10-CM | POA: Diagnosis not present

## 2022-10-06 DIAGNOSIS — R293 Abnormal posture: Secondary | ICD-10-CM

## 2022-10-06 DIAGNOSIS — R262 Difficulty in walking, not elsewhere classified: Secondary | ICD-10-CM

## 2022-10-12 ENCOUNTER — Ambulatory Visit: Payer: Medicaid Other | Admitting: Physical Therapy

## 2022-10-19 ENCOUNTER — Encounter: Payer: Self-pay | Admitting: Family Medicine

## 2022-10-19 ENCOUNTER — Ambulatory Visit: Payer: Medicaid Other | Admitting: Physical Therapy

## 2022-10-24 ENCOUNTER — Other Ambulatory Visit (HOSPITAL_COMMUNITY): Payer: Self-pay

## 2022-10-24 ENCOUNTER — Other Ambulatory Visit: Payer: Self-pay | Admitting: Family Medicine

## 2022-10-24 DIAGNOSIS — R052 Subacute cough: Secondary | ICD-10-CM

## 2022-10-24 DIAGNOSIS — J4540 Moderate persistent asthma, uncomplicated: Secondary | ICD-10-CM

## 2022-10-24 MED ORDER — PANTOPRAZOLE SODIUM 20 MG PO TBEC
20.0000 mg | DELAYED_RELEASE_TABLET | Freq: Every day | ORAL | 3 refills | Status: DC
  Filled 2022-10-24: qty 30, 30d supply, fill #0
  Filled 2022-11-20: qty 30, 30d supply, fill #1
  Filled 2022-12-25: qty 30, 30d supply, fill #2
  Filled 2023-01-25: qty 30, 30d supply, fill #3

## 2022-10-24 MED ORDER — FLOVENT HFA 110 MCG/ACT IN AERO
2.0000 | INHALATION_SPRAY | Freq: Two times a day (BID) | RESPIRATORY_TRACT | 11 refills | Status: DC
  Filled 2022-10-24: qty 12, 30d supply, fill #0
  Filled 2022-12-25: qty 12, 30d supply, fill #1

## 2022-10-25 ENCOUNTER — Other Ambulatory Visit (HOSPITAL_COMMUNITY): Payer: Self-pay

## 2022-10-25 ENCOUNTER — Ambulatory Visit: Payer: Medicaid Other | Attending: Family Medicine

## 2022-10-25 DIAGNOSIS — M6281 Muscle weakness (generalized): Secondary | ICD-10-CM | POA: Insufficient documentation

## 2022-10-25 DIAGNOSIS — R293 Abnormal posture: Secondary | ICD-10-CM | POA: Diagnosis present

## 2022-10-25 DIAGNOSIS — R262 Difficulty in walking, not elsewhere classified: Secondary | ICD-10-CM | POA: Diagnosis present

## 2022-10-25 NOTE — Telephone Encounter (Signed)
Letter completed. Fayette Pho, MD

## 2022-10-25 NOTE — Therapy (Signed)
OUTPATIENT PHYSICAL THERAPY TREATMENT NOTE/DC SUMMARY   Patient Name: Sherri [name] MRN: [id number] DOB:[birth year], 21 y.o., female Today's Date: 10/25/2022  PCP: Dr. Ezequiel Essex, MD REFERRING PROVIDER: Dr. Dorcas Mcmurray, MD PHYSICAL THERAPY DISCHARGE SUMMARY  Visits from Start of Care: 7  Current functional level related to goals / functional outcomes: Goals met   Remaining deficits: Pain and weakness   Education / Equipment: HEP   Patient agrees to discharge. Patient goals were met. Patient is being discharged due to being pleased with the current functional level.  END OF SESSION:   PT End of Session - 10/25/22 1615     Visit Number 7    Number of Visits 8    Date for PT Re-Evaluation 11/04/22    Authorization Type [facility] Medicaid    Authorization Time Period 8 visits 09/29/22-10/26/22    Authorization - Visit Number 5    Authorization - Number of Visits 11    PT Start Time 7591    PT Stop Time 1655    PT Time Calculation (min) 40 min    Activity Tolerance Patient tolerated treatment well;Patient limited by fatigue    Behavior During [facility] for tasks assessed/performed                  Past Medical History:  Diagnosis Date    Chronic pain 02/02/2020   Abnormal vision screen 08/26/2015   Acanthosis nigricans 02/26/2020   Acid reflux    Allergy    Seasonal allergies   Anxiety    Phreesia 11/24/2020   Asthma    Atopic dermatitis 07/14/2013   Carpal tunnel syndrome, bilateral 11/21/2018   S/p bilat surgery, release   Chest pain 07/16/2019   Concussion 02/16/2021   Approx 2/15 d/t hit posterior skull against wall during tic.    Constipation 06/17/2013   Cough 01/29/2021   COVID-19 01/22/2020   Eczema    Elevated blood pressure reading 02/26/2020   Gallstones    GERD (gastroesophageal reflux disease)    Phreesia 11/24/2020   Headache    Obesity    Pain of both hip joints 11/10/2019   PCOS (polycystic ovarian syndrome) 02/26/2020   Recurrent viral  infection 05/15/2022   Seasonal allergies    Tic    Past Surgical History:  Procedure Laterality Date   BILATERAL CARPAL TUNNEL RELEASE     CHOLECYSTECTOMY N/A 08/08/2017   Procedure: LAPAROSCOPIC CHOLECYSTECTOMY;  Surgeon: Stanford Scotland, MD;  Location: [place];  Service: General;  Laterality: N/A;   UPPER GASTROINTESTINAL ENDOSCOPY     Patient Active Problem List   Diagnosis Date Noted   Bipolar disorder ([place]) 09/22/2022   Muscle ache 08/14/2022   Weight loss counseling, encounter for 06/14/2022   Depression, recurrent ([place]) 05/15/2022   Gait disturbance 04/23/2022   Muscle weakness of all 4 extremities 04/13/2022   Major depression 10/07/2021   Abnormal uterine bleeding (AUB) 05/12/2021   Movement disorder 04/14/2021   Macromastia 03/16/2021   Thoracic back pain 03/16/2021   Poor appetite 03/16/2021   Hand pain, not arthralgia 02/16/2021   Healthcare maintenance 01/29/2021   Tic disorder, unspecified 08/10/2020   Hirsutism 02/26/2020   Migraine without aura and without status migrainosus, not intractable 02/10/2020   Moderate persistent asthma 02/10/2020   Irritable bowel syndrome 02/10/2020   Chronic nonintractable headache 02/02/2020   Pre-diabetes 07/16/2019   Breast pain 07/16/2019   Sleeping difficulty 07/16/2019   Menorrhagia with irregular cycle 07/16/2019   Chronic low back pain without  sciatica 04/26/2017   Gastroesophageal reflux  07/29/2015   Vitamin D deficiency 07/29/2013   Morbid obesity ([place]) 07/29/2013   Asthma, chronic 07/14/2013   Allergic rhinitis 07/14/2013    REFERRING DIAG: M62.81 (ICD-10-CM) - Muscle weakness of all 4 extremities R29.898 (ICD-10-CM) - Arm weakness M79.10 (ICD-10-CM) - Muscle ache   THERAPY DIAG:  Muscle weakness (generalized)  Abnormal posture  Difficulty in walking, not elsewhere classified  Rationale for Evaluation and Treatment Rehabilitation  PERTINENT HISTORY: Arm and leg weakness Seen in family medicine clinic  04/11/22 and evaluated for lower extremity weakness immediately after COVID infection.  Patient was utilizing wheelchair, walker, and cane at home due to weakness.  She reports 1 fall down the stairs due to weakness. At the time, weakness was thought to be mainly due to deconditioning secondary to COVID, with possible psychosomatic component as she had recently stopped her psychiatric medications.  Labs around that time were normal including TSH, CMP, CBC with differential x2, vitamin B12, and CK.  Physician at the time discussed more ambulation and referred to PT.   Today, patient reports return of symptoms, now with upper arm weakness as well.  She does not believe she ever completely recovered from the weakness due to COVID, and she has been now worsening gradually.  She and her mom do believe that physical therapy helped a great deal in April.   Patient reports subjective leg weakness and pain when standing for long periods of time.  Also describes feeling of heaviness in the legs, like "a weight is on my legs pushing them down".  In the past couple of days, legs of hurt so bad when standing or moving that she has been relying on aids such as wheelchair, walker, and cane.  Pain acutely worsened yesterday after a full day of walking around doing errands.  Does endorse shooting pains proximally with standing, located both laterally and medially on legs.   Different from before, she is also experiencing pain in shoulders and upper arms with use, specifically reaching up overhead or trying to do her hair.  No acute changes in grip strength or sensation.   No prior imaging of back.  No recent falls.   Denies saddle anesthesia and bowel or bladder incontinence.   No known family history of rheumatoid arthritis, hypothyroidism, or other autoimmune diseases.  Maternal grandmother had similar presentation but in her 29s.  Mother and brother have degenerative disc disease.   Med review performed, only new  medication is Wellbutrin.  This was started back in July by myself as an adjunct of treatment for weight loss.  Patient reports meds for schizophrenia.  I see the medications she is referring to in the chart, but there is no diagnosis of schizophrenia anywhere in her chart.  The psychiatry notes do reference bipolar disorder with psychotic features.  PERTINENT  PMH / PSH: COVID infection, prediabetes, unspecified tic disorder, migraine without aura, IBS, moderate persistent asthma, menorrhagia, macromastia with chronic thoracic back pain, depression.  PRECAUTIONS: Fall  SUBJECTIVE: Much improved, still has chronic and unrelenting pain but manages it.  Now able to ambulate w/o need of AD  PAIN:  Are you having pain? Yes: NPRS scale: 6/10 Pain location: B LEs Pain description: ache Aggravating factors: weight bearing tasks Relieving factors: rest   OBJECTIVE: (objective measures completed at initial evaluation unless otherwise dated)   DIAGNOSTIC FINDINGS:  STUDY DATE: 04/23/2021 PATIENT NAME: IMOJEAN YOSHINO DOB: [birth year] MRN: [id number]   EXAM: MRI Brain with  and without contrast   ORDERING CLINICIAN: Marcial Pacas MD, PhD CLINICAL HISTORY: 21 year old woman with movement disorder COMPARISON FILMS: None   TECHNIQUE:MRI of the brain with and without contrast was obtained utilizing 5 mm axial slices with T1, T2, T2 flair, SWI and diffusion weighted views.  T1 sagittal, T2 coronal and postcontrast views in the axial and coronal plane were obtained. CONTRAST: 20 ml [employer] IMAGING SITE: [facility], [place].   FINDINGS: On sagittal images, the spinal cord is imaged caudally to C5 and is normal in caliber.   The contents of the posterior fossa are of normal size and position.   The pituitary gland and optic chiasm appear normal.    Brain volume appears normal.   The ventricles are normal in size and without distortion.  There are no abnormal extra-axial collections of  fluid.     The cerebellum and brainstem appears normal.   The deep gray matter appears normal.  The cerebral hemispheres appear normal.   Diffusion weighted images are normal.  Susceptibility weighted images are normal.      The orbits appear normal.   The VIIth/VIIIth nerve complex appears normal.  The mastoid air cells appear normal.  The paranasal sinuses appear normal.  Flow voids are identified within the major intracerebral arteries.     After the infusion of contrast material, a normal enhancement pattern is noted.     IMPRESSION: This is a normal MRI of the brain with and without contrast.     PATIENT SURVEYS:  Not taken; 10/25/22 LEFS 47/80   SCREENING FOR RED FLAGS:           Negative  COGNITION:           Overall cognitive status: Within functional limits for tasks assessed                          SENSATION: Not tested   MUSCLE LENGTH: Hamstrings: Right 90 deg; Left 90 deg   POSTURE: Not assessed to to inability to stand unsupported                 DTRs: Equal in BLEs   LUMBAR ROM: Not tested   Active  A/PROM  eval  Flexion    Extension    Right lateral flexion    Left lateral flexion    Right rotation    Left rotation     (Blank rows = not tested)   LOWER EXTREMITY ROM:   WFL through observation   Active  Right eval Left eval  Hip flexion      Hip extension      Hip abduction      Hip adduction      Hip internal rotation      Hip external rotation      Knee flexion      Knee extension      Ankle dorsiflexion      Ankle plantarflexion      Ankle inversion      Ankle eversion       (Blank rows = not tested)   LOWER EXTREMITY MMT:  Cogwheeling noted with MMT   MMT Right eval Left eval  Hip flexion 4- 4-  Hip extension      Hip abduction      Hip adduction      Hip internal rotation      Hip external rotation      Knee flexion  Knee extension 4- 4-  Ankle dorsiflexion 4- 4-  Ankle plantarflexion 4 4  Ankle inversion      Ankle  eversion       (Blank rows = not tested)   LUMBAR SPECIAL TESTS:  Not applicable   FUNCTIONAL TESTS:  5 times sit to stand: 27s arms crossed   GAIT: Distance walked: 12f x2 Assistive device utilized: WEnvironmental consultant- 2 wheeled Level of assistance: Modified independence Comments: Shows decreased TKE and buckling but able to prevent fall to ground.       TODAY'S TREATMENT  [facility] Adult PT Treatment:                                                DATE: 10/25/22 Therapeutic Exercise: Nustep level 4 x 8 mins Bridge w/ball 15x Curl ups 15x STS w/OH press 10x PF against wall 15x Marching against wall 15/15  [facility] Adult PT Treatment:                                                DATE: 10/06/22  [address] therapeutic pool temp 92 degrees Pt enters building ambulating with SPC.  Treatment took place in water 3.8 to  4 ft 8 in.feet deep depending upon activity.  Pt entered and exited the pool via stair and handrails independently with step to pattern.  Pt pain level 6/10 BIL LE, 6/10 LBP at initiation of water walking.  Therapeutic Exercise: Walking forward/backwards/side stepping x2 laps each holding blue barbell and changing to yellow noodle then yellow kickboard for less UE support March walk with blue square noodle x 1 lap Forward lunge walk holding blue square noodle x 1 lap Side stepping with rainbow DB shoulder abd/adduction x2 laps Standing thoracic rotation holding blue square noodle x1' At edge of pool, pt performed LE exercise: Hip abd/add x20 BIL Hamstring curl x20 BIL Squats 2x20 Hip ext/flex with knee straight x 20 BIL Hip Circles CC/CCW x10 each BIL Marching hip flexion to knee extension 2x10 BIL Step ups on submerged step forward/lateral x10 each BIL Sitting on bench in water: LAQ x1' Bicycle kicks x1' Flutter kicks x1' Scissor kicks x1'  Pt requires the buoyancy of water for active assisted exercises with buoyancy supported  for strengthening and AROM exercises. Hydrostatic pressure also supports joints by unweighting joint load by at least 50 % in 3-4 feet depth water. 80% in chest to neck deep water. Water will provide assistance with movement using the current and laminar flow while the buoyancy reduces weight bearing. Pt requires the viscosity of the water for resistance with strengthening exercises.   [facility]                                                [facility]: [facility] Therapeutic Exercise: Nustep level 2 x 8 mins Bridge w/ball 15x Abduction in sidelie 2# 15/15 FAQs w/ball 15x SLRs 2# 15/15 SAQs 2# 15/15 STS w/OH press 10x PF against wall 15x Marching against wall 15/15 Step ups fwd 4 in 10/10 with counter support  Lateral step up 4 in 10/01 with counter support   [facility] Adult PT Treatment:                                                DATE: 09/28/2022 Therapeutic Exercise: Nustep level 5 x 5 mins Seated march 3# 2x1' LAQ 3# 2x10 Seated hamstring curl GTB 2x10 BIL Seated heel/toe raises 3x10 Supine bridge x10 Supine bridge with hip adduction ball squeeze 2x10 Supine march against GTB 2x10 BIL Supine clamshell GTB 3x10 SLR 2x10 BIL Sidelying hip abduction x10 BIL   PATIENT EDUCATION:  Education details: Discussed eval findings, rehab rationale and POC and patient is in agreement  Person educated: Patient Education method: Explanation and Handouts Education comprehension: verbalized understanding and needs further education     HOME EXERCISE PROGRAM:           Access Code: [email or web address] URL: [email or web address] Date: 10/25/2022 Prepared by: Sharlynn Oliphant  Exercises - Standing [employer] at [employer]  - 1 x daily - 5 x weekly - 1 sets - 15 reps - Standing Heel Raises  - 1 x daily - 5 x weekly - 1 sets - 15 reps - Supine Bridge with Mini Swiss Ball Between Knees  - 1 x daily - 5 x weekly - 1 sets - 15 reps - Sit to Stand Without Arm Support  - 1 x daily - 5  x weekly - 1 sets - 5 reps - Curl Up with Arms Crossed  - 1 x daily - 5 x weekly - 1 sets - 15 reps   ASSESSMENT:   CLINICAL IMPRESSION: Rehab goals met, patient now ambulating w/o need of AD.  She continues to note issues with pain and soft tissue inflammation but is able to manage independently.  Patient feels ready to DC to independent management.    OBJECTIVE IMPAIRMENTS Abnormal gait, decreased activity tolerance, decreased balance, decreased coordination, decreased endurance, decreased knowledge of condition, decreased mobility, difficulty walking, decreased strength, impaired perceived functional ability, impaired UE functional use, postural dysfunction, obesity, and pain.    ACTIVITY LIMITATIONS carrying, lifting, standing, squatting, stairs, and transfers   PERSONAL FACTORS Age, Behavior pattern, Fitness, Past/current experiences, and Time since onset of injury/illness/exacerbation are also affecting patient's functional outcome.    REHAB POTENTIAL: Fair based on    CLINICAL DECISION MAKING: Evolving/moderate complexity   EVALUATION COMPLEXITY: Moderate     GOALS: Goals reviewed with patient? No   SHORT TERM GOALS: Target date: 09/18/2022   Initiate aquatic PT program Baseline:TBD Goal status: MET Initiated 09/15/2022     LONG TERM GOALS: Target date: 10/02/2022   Patient to demonstrate independence in HEP  Baseline: [id number] Goal status: Met   2.  Decrease 5x STS time to 25s or less Baseline: 27s; 10/25/22 13s Goal status: Met   3.  262f ambulation with RW and S Baseline: 771fwith RW and S; 10/04/22 7526f with SPC; 10/25/22 [id number] need of AD Goal status: Met      PLAN: PT FREQUENCY: 2x/week   PT DURATION: 4 weeks   PLANNED INTERVENTIONS: Therapeutic exercises, Therapeutic activity, Neuromuscular re-education, Balance training, Gait training, Patient/Family education, Self Care, Joint mobilization, Stair training, Aquatic Therapy, and Re-evaluation.    PLAN FOR NEXT SESSION: DC to HEP   JeffLanice Shirts 10/25/2022, 4:24 PM

## 2022-10-27 NOTE — Telephone Encounter (Signed)
Letter placed up front for pick up.   Patient aware.

## 2022-10-31 ENCOUNTER — Other Ambulatory Visit (HOSPITAL_COMMUNITY): Payer: Self-pay

## 2022-10-31 ENCOUNTER — Other Ambulatory Visit: Payer: Self-pay | Admitting: Family Medicine

## 2022-10-31 DIAGNOSIS — J4530 Mild persistent asthma, uncomplicated: Secondary | ICD-10-CM

## 2022-10-31 DIAGNOSIS — J4540 Moderate persistent asthma, uncomplicated: Secondary | ICD-10-CM

## 2022-10-31 MED ORDER — ALBUTEROL SULFATE HFA 108 (90 BASE) MCG/ACT IN AERS
2.0000 | INHALATION_SPRAY | Freq: Four times a day (QID) | RESPIRATORY_TRACT | 0 refills | Status: DC | PRN
  Filled 2022-10-31: qty 18, 25d supply, fill #0

## 2022-11-01 ENCOUNTER — Telehealth (HOSPITAL_COMMUNITY): Payer: Self-pay | Admitting: *Deleted

## 2022-11-01 NOTE — Telephone Encounter (Signed)
PER PATIENT SHE HAS STOPPED TAKING LYBALVI 10-10 MG. INFORMED PROVIDER DR PARSONS ON APPT 05/03/22

## 2022-11-03 ENCOUNTER — Other Ambulatory Visit (HOSPITAL_COMMUNITY): Payer: Self-pay

## 2022-11-06 ENCOUNTER — Other Ambulatory Visit (HOSPITAL_COMMUNITY): Payer: Self-pay

## 2022-11-06 ENCOUNTER — Encounter: Payer: Self-pay | Admitting: Student

## 2022-11-06 ENCOUNTER — Ambulatory Visit (INDEPENDENT_AMBULATORY_CARE_PROVIDER_SITE_OTHER): Payer: Medicaid Other | Admitting: Student

## 2022-11-06 VITALS — BP 110/72 | HR 96 | Ht 60.25 in | Wt 255.4 lb

## 2022-11-06 DIAGNOSIS — K0889 Other specified disorders of teeth and supporting structures: Secondary | ICD-10-CM

## 2022-11-06 DIAGNOSIS — G43009 Migraine without aura, not intractable, without status migrainosus: Secondary | ICD-10-CM | POA: Diagnosis not present

## 2022-11-06 DIAGNOSIS — Z23 Encounter for immunization: Secondary | ICD-10-CM | POA: Diagnosis present

## 2022-11-06 HISTORY — DX: Other specified disorders of teeth and supporting structures: K08.89

## 2022-11-06 MED ORDER — TOPIRAMATE 25 MG PO TABS
ORAL_TABLET | ORAL | 1 refills | Status: DC
  Filled 2022-11-06: qty 30, 17d supply, fill #0

## 2022-11-06 MED ORDER — KETOROLAC TROMETHAMINE 10 MG PO TABS
10.0000 mg | ORAL_TABLET | Freq: Four times a day (QID) | ORAL | 0 refills | Status: AC | PRN
  Filled 2022-11-06: qty 20, 5d supply, fill #0

## 2022-11-06 MED ORDER — SUMATRIPTAN SUCCINATE 25 MG PO TABS
25.0000 mg | ORAL_TABLET | ORAL | 0 refills | Status: DC | PRN
  Filled 2022-11-06: qty 10, 1d supply, fill #0

## 2022-11-06 NOTE — Progress Notes (Signed)
SUBJECTIVE:   CHIEF COMPLAINT / HPI:   Tooth Pain:  Began: Last week  Located in the back bilaterally  She has been trying to get into the surgery center Has spoken to her dentist, was told to call the surgery center because she would need anesthesia given her tic disorder  Fever or chills: No  Systemic symptoms: No  Pain is rated as 8/10. Pain at night is a 10/10.  Hard to lay on her side  Reports that it gives her headaches.  She feels like it is related to her wisdom teeth, no surgeries in the mouth  Migraine  History of HA, lasting every 1-2 days or even every day in the past. Patient had previously been given Imitrex for breakthrough, although today, she does not remember ever taking this. Ibuprofen and tylenol have given minimal relief, has tried Naproxen.   Keeps hitting her head. Hit her head last week, hit on walls or sinks. Is unsure if she passed out previously, felt her heart was beating quickly, and then she fell asleep, has been over the last two weeks.   Pain from the migraines diffusely over the head, sees spots in her vision. The pain makes her dizzy, sensitive to light and sound. They last until she falls asleep, lasts several hours in length.   PERTINENT  PMH / PSH: Acanthosis nigricans, bipolar disorder, obesity, depression, pre-diabetes, tic disorder, asthma, migraines, GERD    OBJECTIVE:  BP 110/72   Pulse 96   Ht 5' 0.25" (1.53 m)   Wt 255 lb 6.4 oz (115.8 kg)   SpO2 97%   BMI 49.47 kg/m  Physical Exam Vitals reviewed.  Constitutional:      Appearance: Normal appearance.  HENT:     Head: Normocephalic.     Nose: Nose normal.     Mouth/Throat:     Mouth: Mucous membranes are moist. No oral lesions.     Dentition: Normal dentition. No gingival swelling, dental caries or dental abscesses.     Pharynx: Oropharynx is clear. Uvula midline.  Eyes:     Conjunctiva/sclera: Conjunctivae normal.  Cardiovascular:     Rate and Rhythm: Normal rate and  regular rhythm.  Pulmonary:     Effort: Pulmonary effort is normal.     Breath sounds: Normal breath sounds.  Abdominal:     Palpations: Abdomen is soft.  Musculoskeletal:     Cervical back: Normal range of motion.  Skin:    Capillary Refill: Capillary refill takes less than 2 seconds.  Neurological:     General: No focal deficit present.     Mental Status: She is alert and oriented to person, place, and time. Mental status is at baseline.     GCS: GCS eye subscore is 4. GCS verbal subscore is 5. GCS motor subscore is 6.     Cranial Nerves: No cranial nerve deficit or facial asymmetry.     Sensory: No sensory deficit.     Motor: No weakness.     Coordination: Romberg sign negative.  Psychiatric:        Mood and Affect: Mood normal. Mood is not anxious.        Behavior: Behavior normal.      ASSESSMENT/PLAN:  Need for immunization against influenza -     Flu Vaccine QUAD 70mo+IM (Fluarix, Fluzone & Alfiuria Quad PF)  Encounter for immunization [employer] Fall 2023 Covid-19 Vaccine 34yrs and older  Toothache Assessment & Plan: Multiple likely related  to need for wisdom tooth removal.  There does not appear to be an abscess without swelling or erythematous areas in the mouth.  Instructed the patient to try to contact the surgery center for further assessment and evaluation.  She also needs to discuss this further with her dentist.  We will give her short-term Toradol for pain control, instructed her not to take with other NSAIDs but can take with Tylenol.  Orders: -     Ketorolac Tromethamine; Take 1 tablet (10 mg total) by mouth every 6 (six) hours as needed for up to 5 days.  Dispense: 20 tablet; Refill: 0  Migraine without aura and without status migrainosus, not intractable Assessment & Plan: Expect worsening migraines possibly related to the tooth ache she is experiencing.  However, we will continue with a daily medication to help prevent migraines.  Topamax was ordered as  amitriptyline was contraindicated given her medication list as already is using Zoloft.  Provided Imitrex for breakthrough migraines.  Continue with the Topamax daily.  We will have her follow-up with Dr. Augustin Coupe in 2 weeks for further assessment.  She may benefit from headache clinic evaluation in the future.  Reassuringly, the patient had no focal neurologic deficits and was neurologically intact completely.  She had no red flag signs or symptoms on examination.  Return precautions provided.  Orders: -     SUMAtriptan Succinate; Take 1 tablet (25 mg total) by mouth every 2 (two) hours as needed for migraine. May repeat in 2 hours if headache persists or recurs. Maximum 200 mg in 24 hours  Dispense: 10 tablet; Refill: 0 -     Topiramate; 25 mg orally once daily in the evening for first week, 25 mg twice daily for second week, 25 mg in the morning and 50 mg in the evening for third week, and then 50 mg twice daily  Dispense: 30 tablet; Refill: 1   Return in about 2 weeks (around 11/20/2022) for with Dr. Jeani Hawking. Erskine Emery, MD 11/06/2022, 4:34 PM PGY-2, [place]

## 2022-11-06 NOTE — Assessment & Plan Note (Signed)
Multiple likely related to need for wisdom tooth removal.  There does not appear to be an abscess without swelling or erythematous areas in the mouth.  Instructed the patient to try to contact the surgery center for further assessment and evaluation.  She also needs to discuss this further with her dentist.  We will give her short-term Toradol for pain control, instructed her not to take with other NSAIDs but can take with Tylenol.

## 2022-11-06 NOTE — Assessment & Plan Note (Signed)
Expect worsening migraines possibly related to the tooth ache she is experiencing.  However, we will continue with a daily medication to help prevent migraines.  Topamax was ordered as amitriptyline was contraindicated given her medication list as already is using Zoloft.  Provided Imitrex for breakthrough migraines.  Continue with the Topamax daily.  We will have her follow-up with Dr. Juel Burrow in 2 weeks for further assessment.  She may benefit from headache clinic evaluation in the future.  Reassuringly, the patient had no focal neurologic deficits and was neurologically intact completely.  She had no red flag signs or symptoms on examination.  Return precautions provided.

## 2022-11-06 NOTE — Patient Instructions (Addendum)
It was great to see you today! Thank you for choosing [facility] for your primary care. Sherri [name] was seen for follow up.  Continue with the Topamax daily for prophylaxis of migraines You can also use the Imitrex to take when you feel a migraine coming on  Additionally, I ordered Toradol to your pharmacy for pain, can take every 6 hours as NEEDED. DO NOT take with ibuprofen, naproxen.  Can take tylenol with toradol  Return to see Dr. Larita Fife in 2 weeks    If you haven't already, sign up for My Chart to have easy access to your labs results, and communication with your primary care physician.  I recommend that you always bring your medications to each appointment as this makes it easy to ensure you are on the correct medications and helps [place] not miss refills when you need them. Call the clinic at [phone] if your symptoms worsen or you have any concerns.  You should return to our clinic Return in about 2 weeks (around 11/20/2022) for with Dr. Larita Fife. Please arrive 15 minutes before your appointment to ensure smooth check in process.  We appreciate your efforts in making this happen.  Thank you for allowing me to participate in your care, Alfredo Martinez, MD 11/06/2022, 4:25 PM PGY-2, [facility], [facility]

## 2022-11-07 ENCOUNTER — Ambulatory Visit: Payer: Medicaid Other | Admitting: Family Medicine

## 2022-11-14 ENCOUNTER — Encounter: Payer: Self-pay | Admitting: Family Medicine

## 2022-11-16 ENCOUNTER — Other Ambulatory Visit (HOSPITAL_COMMUNITY): Payer: Self-pay

## 2022-11-16 MED ORDER — PREDNISONE 50 MG PO TABS
50.0000 mg | ORAL_TABLET | Freq: Every day | ORAL | 0 refills | Status: DC
  Filled 2022-11-16: qty 5, 5d supply, fill #0

## 2022-11-20 ENCOUNTER — Other Ambulatory Visit: Payer: Self-pay | Admitting: Physician Assistant

## 2022-11-20 ENCOUNTER — Other Ambulatory Visit (HOSPITAL_COMMUNITY): Payer: Self-pay

## 2022-11-20 MED ORDER — LINACLOTIDE 72 MCG PO CAPS
72.0000 ug | ORAL_CAPSULE | Freq: Every day | ORAL | 3 refills | Status: DC
  Filled 2022-11-20: qty 90, 90d supply, fill #0
  Filled 2022-12-25 – 2023-02-15 (×2): qty 90, 90d supply, fill #1
  Filled 2023-04-24 – 2023-05-16 (×3): qty 90, 90d supply, fill #2
  Filled 2023-08-07: qty 90, 90d supply, fill #3

## 2022-11-22 ENCOUNTER — Ambulatory Visit (INDEPENDENT_AMBULATORY_CARE_PROVIDER_SITE_OTHER): Payer: Medicaid Other | Admitting: Family Medicine

## 2022-11-22 ENCOUNTER — Other Ambulatory Visit (HOSPITAL_COMMUNITY): Payer: Self-pay

## 2022-11-22 ENCOUNTER — Encounter: Payer: Self-pay | Admitting: Family Medicine

## 2022-11-22 VITALS — BP 124/64 | HR 80 | Ht 60.25 in | Wt 255.0 lb

## 2022-11-22 DIAGNOSIS — J4540 Moderate persistent asthma, uncomplicated: Secondary | ICD-10-CM | POA: Diagnosis not present

## 2022-11-22 DIAGNOSIS — J4530 Mild persistent asthma, uncomplicated: Secondary | ICD-10-CM

## 2022-11-22 DIAGNOSIS — M6281 Muscle weakness (generalized): Secondary | ICD-10-CM | POA: Diagnosis not present

## 2022-11-22 MED ORDER — ALBUTEROL SULFATE HFA 108 (90 BASE) MCG/ACT IN AERS
2.0000 | INHALATION_SPRAY | Freq: Four times a day (QID) | RESPIRATORY_TRACT | 2 refills | Status: DC | PRN
  Filled 2022-11-22 – 2022-12-14 (×3): qty 18, 25d supply, fill #0

## 2022-11-22 NOTE — Progress Notes (Signed)
SUBJECTIVE:   CHIEF COMPLAINT / HPI:   Leg weakness Patient presents today for further workup of her bilateral leg weakness and developing arm weakness.  She reports that while she has "always had trouble with her legs" including pain and weakness, she undergoes episodes of extreme weakness anytime she is severely ill or has significant muscle exertion.  Of note, as her PCP I was first aware of these episodes after she had a COVID infection.  We initially thought her leg weakness was a form of long COVID and deconditioning.  Possible psychosomatic component.  We sent her to physical therapy she had some interval improvement.  However, she experienced worsening again this fall.    She does not believe she is ever returned back to her normal but does experience interval improvement in between flares.  Was seen in urgent care 11/30, who was concern for Guillain-Barr.  Rx steroids, which patient has now completed.  She reports the steroids "took away the heaviness" laying down her legs, but that it still really difficult to move and she believes she is still extremely weak.  Tic and movement disorder of unknown etiology I referred her on 10/6 to the [facility] movement disorder center for further evaluation.  Patient reports they have contacted her and she has an appointment scheduled for February 15.  Headaches She reports starting Imitrex and Topamax, following instructions and is now on the highest dose Topamax.  No relief with either of these medications.  She reports she cannot take ibuprofen because it gives her extreme chest pains.  Tylenol is without relief.  She reports she had been taking Imitrex and Topamax previously without relief, but tried them again at the insistence of the medical provider.  Of note, with her tic and movement disorder she does hit her head nearly every day despite her best attempts at being careful with her surroundings.  PERTINENT  PMH / PSH: Tic and movement  disorder of unknown etiology, migraine without aura, muscle weakness, gait disturbance, IBS, moderate persistent asthma, pre-diabetes  OBJECTIVE:   BP 124/64   Pulse 80   Ht 5' 0.25" (1.53 m)   Wt 255 lb (115.7 kg)   LMP 11/22/2022   SpO2 98%   BMI 49.39 kg/m    PHQ-9:     11/22/2022    9:26 AM 11/06/2022    3:43 PM 09/06/2022    1:16 PM  Depression screen PHQ 2/9  Decreased Interest 2 2   Down, Depressed, Hopeless 0 2   PHQ - 2 Score 2 4   Altered sleeping 3 3   Tired, decreased energy 3 2   Change in appetite 3 3   Feeling bad or failure about yourself  1 0   Trouble concentrating 1 1   Moving slowly or fidgety/restless 1 1   Suicidal thoughts 0 0   PHQ-9 Score 14 14   Difficult doing work/chores Somewhat difficult Very difficult      Information is confidential and restricted. Go to Review Flowsheets to unlock data.     Physical Exam General: Awake, alert, oriented Cardiovascular: Regular rate and rhythm, S1 and S2 present, no murmurs auscultated Respiratory: Lung fields clear to auscultation bilaterally Neuro: BLE strength 2/5 and equal bilaterally, patellar reflexes present and symmetric, BUE strength 3/5 and equal bilaterally  ASSESSMENT/PLAN:   Muscle weakness of all 4 extremities Continued and progressively worsening.  Patient herself worried about MS and Guillain-Barr syndrome based on reading and other providers input.  Brain MRI in 2022 was normal, however this was before symptom onset.  Would like to get patient back in with neurology for further workup including EMG and imaging.  I want to order brain and spinal MRI to rule out MS prior to her being seen by neurology, so they may also evaluate MRI.  Will reach out to neurology for input.  Plan to order test by the end of this week.     Fayette Pho, MD [facility]

## 2022-11-22 NOTE — Patient Instructions (Signed)
It was wonderful to see you today. Thank you for allowing me to be a part of your care. Below is a short summary of what we discussed at your visit today:  Leg weakness I am going to reach out to your neurologist to get imaging recommendations.  I will order MRI imaging by the end of the week. We will call you with the appointment days and times.  You need neurology for nerve conduction testing. I will have them get with you and schedule this.   Headaches This is likely because you are hitting your head with your tics.  Please keep trying to topomax and make sure you are following directions so you get up to the effective dose.  I will image your head to make sure there is nothing going on inside the brain.    Please bring all of your medications to every appointment!  If you have any questions or concerns, please do not hesitate to contact us via phone or MyChart message.   Fayette Pho, MD

## 2022-11-22 NOTE — Assessment & Plan Note (Signed)
Continued and progressively worsening.  Patient herself worried about MS and Guillain-Barr syndrome based on reading and other providers input.  Brain MRI in 2022 was normal, however this was before symptom onset.  Would like to get patient back in with neurology for further workup including EMG and imaging.  I want to order brain and spinal MRI to rule out MS prior to her being seen by neurology, so they may also evaluate MRI.  Will reach out to neurology for input.  Plan to order test by the end of this week.

## 2022-11-24 ENCOUNTER — Other Ambulatory Visit: Payer: Self-pay | Admitting: Family Medicine

## 2022-11-24 ENCOUNTER — Encounter: Payer: Self-pay | Admitting: Family Medicine

## 2022-11-24 DIAGNOSIS — M6281 Muscle weakness (generalized): Secondary | ICD-10-CM

## 2022-11-24 NOTE — Progress Notes (Signed)
Neurology recommends MR brain for MS eval. Will order now. [facility] RN to call and schedule.  Neuro will fit into schedule next available.  Fayette Pho, MD

## 2022-12-08 ENCOUNTER — Telehealth (INDEPENDENT_AMBULATORY_CARE_PROVIDER_SITE_OTHER): Payer: Medicaid Other | Admitting: Student in an Organized Health Care Education/Training Program

## 2022-12-08 ENCOUNTER — Other Ambulatory Visit (HOSPITAL_COMMUNITY): Payer: Self-pay

## 2022-12-08 ENCOUNTER — Encounter (HOSPITAL_COMMUNITY): Payer: Self-pay | Admitting: Student in an Organized Health Care Education/Training Program

## 2022-12-08 DIAGNOSIS — F315 Bipolar disorder, current episode depressed, severe, with psychotic features: Secondary | ICD-10-CM

## 2022-12-08 DIAGNOSIS — F639 Impulse disorder, unspecified: Secondary | ICD-10-CM | POA: Diagnosis not present

## 2022-12-08 MED ORDER — RISPERIDONE 0.5 MG PO TABS
0.5000 mg | ORAL_TABLET | Freq: Two times a day (BID) | ORAL | 2 refills | Status: DC
  Filled 2022-12-08: qty 60, 30d supply, fill #0
  Filled 2022-12-25 – 2023-01-07 (×2): qty 60, 30d supply, fill #1
  Filled 2023-02-20: qty 60, 30d supply, fill #2

## 2022-12-08 MED ORDER — TRAZODONE HCL 50 MG PO TABS
50.0000 mg | ORAL_TABLET | Freq: Every day | ORAL | 3 refills | Status: DC
  Filled 2022-12-08: qty 30, 30d supply, fill #0
  Filled 2023-01-07: qty 30, 30d supply, fill #1

## 2022-12-08 MED ORDER — SERTRALINE HCL 50 MG PO TABS
50.0000 mg | ORAL_TABLET | Freq: Every day | ORAL | 3 refills | Status: DC
  Filled 2022-12-08: qty 50, 50d supply, fill #0
  Filled 2023-02-20: qty 50, 50d supply, fill #1
  Filled 2023-04-09 (×2): qty 50, 50d supply, fill #2

## 2022-12-08 NOTE — Progress Notes (Signed)
BH MD/PA/NP OP Progress Note  12/08/2022 2:06 PM [name]  MRN:  [id number]  Chief Complaint:  Chief Complaint  Patient presents with   Follow-up   Virtual Visit via Video Note  I connected with Sherri [name] on 12/08/22 at 11:00 AM EST by a video enabled telemedicine application and verified that I am speaking with the correct person using two identifiers.  Location: Patient: Home Provider: Office   I discussed the limitations of evaluation and management by telemedicine and the availability of in person appointments. The patient expressed understanding and agreed to proceed.  History of Present Illness:  Sherri [name] is a 21 year old female seen today for follow-up psychiatric evaluation. She has a psychiatric history of bipolar disorder, SI, anxiety and depression.  Patient reports she has been compliant with the following medications  Zoloft 50 mg daily Trazodone 50 mg nightly as needed  Patient reports that her Wellbutrin was discontinued by her PCP.  Patient reports she continues to have physical issues including worsening tremor in her left hand.  Patient reports that unfortunately this is negatively impacting her mental health.  Patient reports that she normally likes to paint, and has been working on [employer] however her tremor is making this very difficult.  Patient reports that she does feel the Zoloft has helped with some of her dysphoric mood and endorses that she maybe only has 3 bad days a week.  Patient reports that she does continue to feel "tired, annoyed, and depressed."  Patient reports that she thinks that since her Zoloft was increased and her Risperdal was discontinued she has been more impulsive, with racing thoughts, and she also has been feeling more paranoid feeling as though someone is watching her.  Patient reports that she cannot recall going more than 18 hours without sleep, but reports that she does have issues with impulse control.   Patient reports that on occasion she has had episodes where she suddenly feels as though her thoughts are racing and she begins to steal more.  Patient reports otherwise she is not having any SI, HI or AVH.  Patient reports she does occasionally feel as though she sees things out of the corner of her eye.  Patient also reports that her appetite has been fairly stable.   I discussed the assessment and treatment plan with the patient. The patient was provided an opportunity to ask questions and all were answered. The patient agreed with the plan and demonstrated an understanding of the instructions.   The patient was advised to call back or seek an in-person evaluation if the symptoms worsen or if the condition fails to improve as anticipated.  I provided 25 minutes of non-face-to-face time during this encounter.   [name], MD  Visit Diagnosis:    ICD-10-CM   1. Bipolar disorder, current episode depressed, severe, with psychotic features (HCC)  F31.5 sertraline (ZOLOFT) 50 MG tablet    traZODone (DESYREL) 50 MG tablet    risperiDONE (RISPERDAL) 0.5 MG tablet    2. Impulse control disorder  F63.9 risperiDONE (RISPERDAL) 0.5 MG tablet      Past Psychiatric History: Bipolar disorder ? (Unsure of who dx her with this or when), SI, anxiety and depression   Past Medical History:  Past Medical History:  Diagnosis Date    Chronic pain 02/02/2020   Abnormal vision screen 08/26/2015   Acanthosis nigricans 02/26/2020   Acid reflux    Allergy    Seasonal allergies  Anxiety    Phreesia 11/24/2020   Asthma    Atopic dermatitis 07/14/2013   Carpal tunnel syndrome, bilateral 11/21/2018   S/p bilat surgery, release   Chest pain 07/16/2019   Concussion 02/16/2021   Approx 2/15 d/t hit posterior skull against wall during tic.    Constipation 06/17/2013   Cough 01/29/2021   COVID-19 01/22/2020   Eczema    Elevated blood pressure reading 02/26/2020   Gallstones    GERD  (gastroesophageal reflux disease)    Phreesia 11/24/2020   Headache    Obesity    Pain of both hip joints 11/10/2019   PCOS (polycystic ovarian syndrome) 02/26/2020   Recurrent viral infection 05/15/2022   Seasonal allergies    Tic    Toothache 11/06/2022    Past Surgical History:  Procedure Laterality Date   BILATERAL CARPAL TUNNEL RELEASE     CHOLECYSTECTOMY N/A 08/08/2017   Procedure: LAPAROSCOPIC CHOLECYSTECTOMY;  Surgeon: Kandice Hams, MD;  Location: [facility] OR;  Service: General;  Laterality: N/A;   UPPER GASTROINTESTINAL ENDOSCOPY      Family Psychiatric History: Mother depression, brother depression, Sister depression, 2 brother learning disability, a maternal aunt depression   Family History:  Family History  Problem Relation Age of Onset   Asthma Mother    Arthritis Mother    Depression Mother    Diabetes Mother    Hypertension Mother    Obesity Mother    Irritable bowel syndrome Mother    Asthma Sister    Learning disabilities Sister    Stroke Sister    Asthma Brother    Depression Brother    Learning disabilities Brother    Irritable bowel syndrome Brother    Depression Maternal Aunt    Diabetes Maternal Aunt    Mental illness Maternal Aunt    Diabetes Maternal Uncle    Kidney disease Maternal Uncle    Arthritis Maternal Grandmother    Diabetes Maternal Grandmother    Heart disease Maternal Grandmother    Kidney disease Maternal Grandmother    Hypertension Maternal Grandmother    Hyperlipidemia Maternal Grandmother    Cervical cancer Maternal Grandmother    Stroke Maternal Grandfather    Cancer Other    COPD Other    Heart disease Other    Hypertension Other    Obesity Father    Heart attack Father    Heart disease Paternal Grandmother    Hypertension Paternal Grandmother    Colon cancer Neg Hx    Esophageal cancer Neg Hx    Pancreatic cancer Neg Hx    Stomach cancer Neg Hx     Social History:  Social History   Socioeconomic History    Marital status: Single    Spouse name: Not on file   Number of children: 0   Years of education: 12   Highest education level: GED or equivalent  Occupational History   Occupation: Furniture conservator/restorer to college   Occupation: Seeking disability  Tobacco Use   Smoking status: Never    Passive exposure: Yes   Smokeless tobacco: Never   Tobacco comments:    mom stopped smoking  Vaping Use   Vaping Use: Never used  Substance and Sexual Activity   Alcohol use: No   Drug use: No   Sexual activity: Never  Other Topics Concern   Not on file  Social History Narrative   Lives with Mom and some siblings, some of whom have children of their own, dropped out of [employer]. plans to  start [employer] for GED.   Right-handed.   No daily use of caffeine.    Social Determinants of Health   Financial Resource Strain: Not on file  Food Insecurity: No Food Insecurity (06/08/2020)   Hunger Vital Sign    Worried About Running Out of Food in the Last Year: Never true    Ran Out of Food in the Last Year: Never true  Transportation Needs: Unmet Transportation Needs (06/08/2020)   PRAPARE - Administrator, Civil Service (Medical): Yes    Lack of Transportation (Non-Medical): Yes  Physical Activity: Not on file  Stress: Not on file  Social Connections: Not on file    Allergies:  Allergies  Allergen Reactions   Ibuprofen Other (See Comments)    Chest pain   Shellfish Allergy Hives and Itching    Mouth itches, facial hives.     Metabolic Disorder Labs: Lab Results  Component Value Date   HGBA1C 5.3 12/07/2021   MPG 123 02/11/2020   MPG 126 07/18/2019   Lab Results  Component Value Date   PROLACTIN 12.2 07/18/2019   Lab Results  Component Value Date   CHOL 136 01/27/2021   TRIG 74 01/27/2021   HDL 42 01/27/2021   CHOLHDL 3.2 01/27/2021   VLDL 16 07/25/2013   LDLCALC 79 01/27/2021   LDLCALC 66 08/01/2018   Lab Results  Component Value Date   TSH 1.02 03/23/2022   TSH 0.60 04/18/2021     Therapeutic Level Labs: No results found for: "LITHIUM" No results found for: "VALPROATE" No results found for: "CBMZ"  Current Medications: Current Outpatient Medications  Medication Sig Dispense Refill   risperiDONE (RISPERDAL) 0.5 MG tablet Take 1 tablet (0.5 mg total) by mouth 2 (two) times daily. 60 tablet 2   albuterol (VENTOLIN HFA) 108 (90 Base) MCG/ACT inhaler Inhale 2 puffs into the lungs every 6 (six) hours as needed for wheezing or shortness of breath. 18 g 2   cholecalciferol (VITAMIN D3) 25 MCG (1000 UNIT) tablet Take 1 tablet (1,000 Units total) by mouth daily. Return for vit D recheck when this bottle runs out. 90 tablet 0   cyanocobalamin (VITAMIN B12) 1000 MCG tablet Take 1 tablet (1,000 mcg total) by mouth daily. Return for vit B12 recheck when this bottle runs out. 90 tablet 0   etonogestrel (NEXPLANON) 68 MG IMPL implant 1 each (68 mg total) by Subdermal route once. 1 each 0   FLOVENT HFA 110 MCG/ACT inhaler Inhale 2 puffs into the lungs 2 (two) times daily. 12 g 11   linaclotide (LINZESS) 72 MCG capsule Take 1 capsule (72 mcg total) by mouth daily before breakfast. 90 capsule 3   melatonin 3 MG TABS tablet Take 1 tablet (3 mg total) by mouth at bedtime. 30 tablet 3   metFORMIN (GLUCOPHAGE) 500 MG tablet Take 1 tablet (500 mg total) by mouth 2 (two) times daily with a meal. 180 tablet 1   pantoprazole (PROTONIX) 20 MG tablet Take 1 tablet (20 mg total) by mouth daily. 30 tablet 3   predniSONE (DELTASONE) 50 MG tablet Take 1 tablet (50 mg total) by mouth daily for 5 days. 5 tablet 0   sertraline (ZOLOFT) 50 MG tablet Take 1 tablet (50 mg total) by mouth daily. 50 tablet 3   Spacer/Aero Chamber Mouthpiece MISC 1 Device by Does not apply route as needed. (Patient not taking: Reported on 05/03/2022) 1 each 0   SUMAtriptan (IMITREX) 25 MG tablet Take 1 tablet (25 mg  total) by mouth every 2 (two) hours as needed for migraine. May repeat in 2 hours if headache persists or  recurs. Maximum 200MG  in 24 hours 10 tablet 0   topiramate (TOPAMAX) 25 MG tablet 25 mg orally once daily in the evening for first week, 25 mg twice daily for second week, 25 mg in the morning and 50 mg in the evening for third week, and then 50 mg twice daily 30 tablet 1   traZODone (DESYREL) 50 MG tablet Take 1 tablet (50 mg total) by mouth at bedtime. 30 tablet 3   triamcinolone cream in Minerin Creme Apply 1 application topically 2 (two) times daily. Do not use for more than 7 consecutive days 454 g 0   No current facility-administered medications for this visit.     Musculoskeletal: defer  Psychiatric Specialty Exam: Review of Systems  Last menstrual period 11/22/2022.There is no height or weight on file to calculate BMI.  General Appearance: Casual  Eye Contact:  Fair  Speech:  Clear and Coherent  Volume:  Normal  Mood:  Euthymic  Affect:  Appropriate  Thought Process:  Coherent  Orientation:  Full (Time, Place, and Person)  Thought Content: Logical   Suicidal Thoughts:  No  Homicidal Thoughts:  No  Memory:  Immediate;   Good Recent;   Good  Judgement:  Fair  Insight:  Fair  Psychomotor Activity:  Tremor and in hand, shown on camera, occasional tourette/ choreatic body movements  Concentration:  Concentration: Good  Recall:  Good  Fund of Knowledge: Good  Language: Good  Akathisia:  No  Handed:    AIMS (if indicated): not done  Assets:  Communication Skills Desire for Improvement Housing Resilience Social Support  ADL's:  Intact  Cognition: WNL  Sleep:  Fair   Screenings: GAD-7    Flowsheet Row Video Visit from 09/06/2022 in [facility] Clinical Support from 01/30/2022 in [facility] Office Visit from 11/03/2021 in [facility] Office Visit from 06/08/2020 in [facility] Video Visit from 02/10/2020 in [place] [facility]  Total GAD-7 Score 13 19 18 12 21       PHQ2-9    Flowsheet Row Office Visit from 11/22/2022 in [facility] Office Visit from 11/06/2022 in [facility] Video Visit from 09/06/2022 in [facility] Office Visit from 08/14/2022 in [facility] Office Visit from 07/10/2022 in [facility]  PHQ-2 Total Score 2 4 4 4 4   PHQ-9 Total Score 14 14 15 13 17       Flowsheet Row Video Visit from 06/05/2022 in [facility] ED from 05/01/2022 in [facility] ED from 04/03/2022 in [facility]  C-SSRS RISK CATEGORY Error: Q7 should not be populated when Q6 is No No Risk No Risk        Assessment and Plan:  Rakiyah Esch is a 21 year old female seen today for follow-up psychiatric evaluation. She has a psychiatric history of bipolar disorder, SI, anxiety and depression. Based on assessment today it appears that without Risperdal on board, patient does have increased paranoia, racing thoughts and increased impulse control.  We will continue Zoloft as it is helping patient's dysphoric mood but will not increase at this time.  It is interesting  that patient was on Zoloft in the past and it did not lead to medication induced manic episode.  Concern for bipolar disorder is lower however patient does appear to have impulse control disorder which is often comorbid with Tourette's and tics.  It is unlikely that patient's risperidone is causing worsening tremor as she has not been on the medication the last few months and her tremor has worsened.  Risperdal may also help with patient's impulse control issues, may consider clonidine or guanfacine in the future.  Would like for patient also get her neurological evaluation.  Bipolar disorder versus MDD W/psychotic features Impulse control  disorder Tourette syndrome - Continue Zoloft 50 mg daily - Continue trazodone 50 mg nightly as needed - Restart Risperdal at 0.5 mg twice daily  Follow-up in approximately 6 weeks  Collaboration of Care: Collaboration of Care:   Patient/Guardian was advised Release of Information must be obtained prior to any record release in order to collaborate their care with an outside provider. Patient/Guardian was advised if they have not already done so to contact the registration department to sign all necessary forms in order for us to release information regarding their care.   Consent: Patient/Guardian gives verbal consent for treatment and assignment of benefits for services provided during this visit. Patient/Guardian expressed understanding and agreed to proceed.   PGY-3 Bobbye MortonJai B Ivannah Zody, MD 12/08/2022, 2:06 PM

## 2022-12-12 ENCOUNTER — Other Ambulatory Visit (HOSPITAL_COMMUNITY): Payer: Self-pay

## 2022-12-12 ENCOUNTER — Telehealth (HOSPITAL_COMMUNITY): Payer: Medicaid Other | Admitting: Student in an Organized Health Care Education/Training Program

## 2022-12-14 ENCOUNTER — Other Ambulatory Visit (HOSPITAL_COMMUNITY): Payer: Self-pay

## 2022-12-22 ENCOUNTER — Ambulatory Visit
Admission: RE | Admit: 2022-12-22 | Discharge: 2022-12-22 | Disposition: A | Discharge status: Home / Self Care | Payer: Medicaid Other | Source: Ambulatory Visit | Attending: Family Medicine | Admitting: Family Medicine

## 2022-12-22 DIAGNOSIS — M6281 Muscle weakness (generalized): Secondary | ICD-10-CM

## 2022-12-22 MED ORDER — GADOPICLENOL 0.5 MMOL/ML IV SOLN
10.0000 mL | Freq: Once | INTRAVENOUS | Status: AC | PRN
  Administered 2022-12-22: 10 mL via INTRAVENOUS

## 2022-12-25 ENCOUNTER — Other Ambulatory Visit (HOSPITAL_COMMUNITY): Payer: Self-pay

## 2022-12-26 ENCOUNTER — Other Ambulatory Visit (HOSPITAL_COMMUNITY): Payer: Self-pay

## 2022-12-26 ENCOUNTER — Other Ambulatory Visit: Payer: Self-pay

## 2022-12-27 ENCOUNTER — Other Ambulatory Visit (HOSPITAL_COMMUNITY): Payer: Self-pay

## 2022-12-27 MED ORDER — PENICILLIN V POTASSIUM 500 MG PO TABS
500.0000 mg | ORAL_TABLET | Freq: Four times a day (QID) | ORAL | 0 refills | Status: DC
  Filled 2022-12-27: qty 56, 14d supply, fill #0

## 2022-12-27 MED ORDER — CHLORHEXIDINE GLUCONATE 0.12 % MT SOLN
15.0000 mL | Freq: Two times a day (BID) | OROMUCOSAL | 0 refills | Status: DC
  Filled 2022-12-27: qty 473, 16d supply, fill #0

## 2022-12-27 MED ORDER — ACETAMINOPHEN 500 MG PO TABS
500.0000 mg | ORAL_TABLET | ORAL | 0 refills | Status: DC | PRN
  Filled 2022-12-27: qty 28, 5d supply, fill #0

## 2023-01-01 ENCOUNTER — Other Ambulatory Visit (HOSPITAL_COMMUNITY): Payer: Self-pay

## 2023-01-01 MED ORDER — IBUPROFEN 800 MG PO TABS
800.0000 mg | ORAL_TABLET | Freq: Four times a day (QID) | ORAL | 0 refills | Status: DC | PRN
  Filled 2023-01-01 – 2023-01-07 (×2): qty 21, 6d supply, fill #0

## 2023-01-04 ENCOUNTER — Other Ambulatory Visit (HOSPITAL_COMMUNITY): Payer: Self-pay

## 2023-01-05 ENCOUNTER — Other Ambulatory Visit (HOSPITAL_COMMUNITY): Payer: Self-pay

## 2023-01-06 ENCOUNTER — Other Ambulatory Visit (HOSPITAL_COMMUNITY): Payer: Self-pay

## 2023-01-08 ENCOUNTER — Other Ambulatory Visit (HOSPITAL_COMMUNITY): Payer: Self-pay

## 2023-01-08 ENCOUNTER — Other Ambulatory Visit: Payer: Self-pay

## 2023-01-11 ENCOUNTER — Encounter: Payer: Self-pay | Admitting: Neurology

## 2023-01-11 ENCOUNTER — Other Ambulatory Visit: Payer: Self-pay

## 2023-01-11 ENCOUNTER — Ambulatory Visit (INDEPENDENT_AMBULATORY_CARE_PROVIDER_SITE_OTHER): Payer: Medicaid Other | Admitting: Neurology

## 2023-01-11 VITALS — BP 116/75 | HR 79 | Ht 60.0 in | Wt 270.5 lb

## 2023-01-11 DIAGNOSIS — R531 Weakness: Secondary | ICD-10-CM

## 2023-01-11 DIAGNOSIS — E559 Vitamin D deficiency, unspecified: Secondary | ICD-10-CM

## 2023-01-11 DIAGNOSIS — G259 Extrapyramidal and movement disorder, unspecified: Secondary | ICD-10-CM | POA: Diagnosis not present

## 2023-01-11 DIAGNOSIS — F418 Other specified anxiety disorders: Secondary | ICD-10-CM | POA: Diagnosis not present

## 2023-01-11 DIAGNOSIS — E538 Deficiency of other specified B group vitamins: Secondary | ICD-10-CM

## 2023-01-11 NOTE — Progress Notes (Signed)
Chief Complaint  Patient presents with   Follow-up    Rm 15. Patient with mother and tourette's is getting worse and when she is sick she is not able to walk or use her legs.       ASSESSMENT AND PLAN  LANIER FELTY is a 22 y.o. female   Abnormal movement   Most consistent of motor tic in the setting of depression, anxiety,  She has quick change of different forms of uncontrollable body  movement over short period of time  Laboratory evaluation showed no treatable etiology,  Not typical for Tourette's syndrome, continue her care with psychiatrist, Intermittent weakness,  Variable effort on examination, giveaway weakness, brisk reflex,  No evidence of muscle weakness,  Laboratory evaluation including thyroid functional test, acetylcholine receptor antibody,  Only return to neurology clinic if any of the above evaluation was abnormal   DIAGNOSTIC DATA (LABS, IMAGING, TESTING) - I reviewed patient records, labs, notes, testing and imaging myself where available.   HISTORICAL  EARLENE BJELLAND is a 22 year old female, seen in request by her primary care physician Dr. Ezequiel Essex for evaluation of abnormal movement, initial evaluation was on April 14, 2021  I reviewed and summarized the referring note. PMHx. GERD Obesity, Nexplanon since 18-Apr-2020 for menstruation cramp,  Patient is graduating from high school, reported she is in the process of applying for disability because of her gradual worsening abnormal movement, large amplitude complex motor tics, to the point of throwing a knife out of her hands  Mother reported patient was born full-term, developmentally normal, but has been combating depression since her father died in 04-18-18, tried different medications without significant improvement, such as Prozac, Cymbalta, amitriptyline, Zoloft and few other medications, is currently receiving psychotherapy, she has frequent panic attack,  She began to notice abnormal body  movements since January 2021, initially it was sudden left hip jerking movement, it change rapidly over past 1 year, now she has large amplitude upper body jerking movement, sometimes to the right, sometimes to the left, she also developed vocal tics, uncontrollable yelp sound, mother described like dog parking.  She also complains worsening anxiety, difficulty focusing, feeling fatigue,  UPDATE July 5th 2022: Patient returns earlier than expected, she was given Abilify 5mg  qday since Apr 18, 2021, it works really well for 3 weeks, she reported only soft tics, minor movement, then stop working, now she has recurrent large amplitude onset of neck body jerking movement, occasionally making voice  She has not seen by psychiatrist yet, complains of worsening depression anxiety  Laboratory evaluations in Apr 18, 2021: Normal or negative RPR, HIV, TSH, C-reactive protein, ANA, ESR, CBC, CMP, creatinine 0.82, vitamin D was decreased 16.4,  UPDATE Jan 11 2023: She is accompanied by her mother at today's clinical visit, continue under the care of psychiatrist, was given the diagnosis of depression anxiety PTSD, on polypharmacy treatment, reported suboptimal control for mood disorder  Continue has frequent abnormal body movements, I witnessed two episodes, sudden clapping of her hands,  She was ajovy Apr 18, 2022 for COVID, upper respiratory symptoms for 2 weeks, again RSV in December 2023, during her upper respiratory infection, she complains of generalized weakness, lower extremity weakness, to the point of difficulty walking, now bounced back to normal, usually last 1 to 2 weeks  She denied persistent sensory loss, no bowel or bladder incontinence.  Variable effort on today's examination, giveaway weakness,    REVIEW OF SYSTEMS:  Full 14 system review of systems performed and notable only  for as above All other review of systems were negative.  PHYSICAL EXAM:   Vitals:   01/11/23 1259  BP: 116/75  Pulse:  79  Weight: 270 lb 8 oz (122.7 kg)  Height: 5' (1.524 m)   Not recorded     Body mass index is 52.83 kg/m.  PHYSICAL EXAMNIATION:  Gen: NAD, conversant, well nourised, well groomed                NEUROLOGICAL EXAM:  MENTAL STATUS: sudden clapping of her hands,  Speech/cognition: Awake, alert, oriented to history taking and casual conversation   CRANIAL NERVES: CN II: Visual fields are full to confrontation. Pupils are round equal and briskly reactive to light. CN III, IV, VI: extraocular movement are normal. No ptosis. CN V: Facial sensation is intact to light touch CN VII: Face is symmetric with normal eye closure  CN VIII: Hearing is normal to causal conversation. CN IX, X: Phonation is normal. CN XI: Head turning and shoulder shrug are intact  MOTOR: Normal muscle tone, strength, giveaway weakness  REFLEXES: Reflexes are 2 and symmetric at the biceps, triceps, knees, and ankles.   SENSORY: Intact to light touch, pinprick and vibratory sensation are intact in fingers and toes.  COORDINATION: There is no trunk or limb dysmetria noted.  GAIT/STANCE: Able to get up from seated position arm crossed, steady  ALLERGIES: Allergies  Allergen Reactions   Ibuprofen Other (See Comments)    Chest pain   Shellfish Allergy Hives and Itching    Mouth itches, facial hives.     HOME MEDICATIONS: Current Outpatient Medications  Medication Sig Dispense Refill   acetaminophen (TYLENOL) 500 MG tablet Take 1 tablet (500 mg total) by mouth every 4-6 hours as needed. 28 tablet 0   albuterol (VENTOLIN HFA) 108 (90 Base) MCG/ACT inhaler Inhale 2 puffs into the lungs every 6 (six) hours as needed for wheezing or shortness of breath. 18 g 2   chlorhexidine (PERIDEX) 0.12 % solution Rinse mouth with 15 mLs ( 1 capful) for 30 seconds in the morning and evening after toothbrushing. Expectorate after rinsing, DO NOT SWALLOW 473 mL 0   cholecalciferol (VITAMIN D3) 25 MCG (1000 UNIT)  tablet Take 1 tablet (1,000 Units total) by mouth daily. Return for vit D recheck when this bottle runs out. 90 tablet 0   cyanocobalamin (VITAMIN B12) 1000 MCG tablet Take 1 tablet (1,000 mcg total) by mouth daily. Return for vit B12 recheck when this bottle runs out. 90 tablet 0   etonogestrel (NEXPLANON) 68 MG IMPL implant 1 each (68 mg total) by Subdermal route once. 1 each 0   FLOVENT HFA 110 MCG/ACT inhaler Inhale 2 puffs into the lungs 2 (two) times daily. 12 g 11   ibuprofen (ADVIL) 800 MG tablet Take 1 tablet (800 mg total) by mouth every 6 (six) hours as needed for pain. 21 tablet 0   linaclotide (LINZESS) 72 MCG capsule Take 1 capsule (72 mcg total) by mouth daily before breakfast. 90 capsule 3   melatonin 3 MG TABS tablet Take 1 tablet (3 mg total) by mouth at bedtime. 30 tablet 3   metFORMIN (GLUCOPHAGE) 500 MG tablet Take 1 tablet (500 mg total) by mouth 2 (two) times daily with a meal. 180 tablet 1   pantoprazole (PROTONIX) 20 MG tablet Take 1 tablet (20 mg total) by mouth daily. 30 tablet 3   penicillin v potassium (VEETID) 500 MG tablet Take 1 tablet (500 mg total) by mouth 4 (  four) times daily until gone 56 tablet 0   predniSONE (DELTASONE) 50 MG tablet Take 1 tablet (50 mg total) by mouth daily for 5 days. 5 tablet 0   risperiDONE (RISPERDAL) 0.5 MG tablet Take 1 tablet (0.5 mg total) by mouth 2 (two) times daily. 60 tablet 2   sertraline (ZOLOFT) 50 MG tablet Take 1 tablet (50 mg total) by mouth daily. 50 tablet 3   Spacer/Aero Chamber Mouthpiece MISC 1 Device by Does not apply route as needed. 1 each 0   SUMAtriptan (IMITREX) 25 MG tablet Take 1 tablet (25 mg total) by mouth every 2 (two) hours as needed for migraine. May repeat in 2 hours if headache persists or recurs. Maximum 200MG  in 24 hours 10 tablet 0   topiramate (TOPAMAX) 25 MG tablet 25 mg orally once daily in the evening for first week, 25 mg twice daily for second week, 25 mg in the morning and 50 mg in the evening for  third week, and then 50 mg twice daily 30 tablet 1   traZODone (DESYREL) 50 MG tablet Take 1 tablet (50 mg total) by mouth at bedtime. 30 tablet 3   triamcinolone cream in Minerin Creme Apply 1 application topically 2 (two) times daily. Do not use for more than 7 consecutive days 454 g 0   No current facility-administered medications for this visit.    PAST MEDICAL HISTORY: Past Medical History:  Diagnosis Date    Chronic pain 02/02/2020   Abnormal vision screen 08/26/2015   Acanthosis nigricans 02/26/2020   Acid reflux    Allergy    Seasonal allergies   Anxiety    Phreesia 11/24/2020   Asthma    Atopic dermatitis 07/14/2013   Carpal tunnel syndrome, bilateral 11/21/2018   S/p bilat surgery, release   Chest pain 07/16/2019   Concussion 02/16/2021   Approx 2/15 d/t hit posterior skull against wall during tic.    Constipation 06/17/2013   Cough 01/29/2021   COVID-19 01/22/2020   Eczema    Elevated blood pressure reading 02/26/2020   Gallstones    GERD (gastroesophageal reflux disease)    Phreesia 11/24/2020   Headache    Obesity    Pain of both hip joints 11/10/2019   PCOS (polycystic ovarian syndrome) 02/26/2020   Recurrent viral infection 05/15/2022   Seasonal allergies    Tic    Toothache 11/06/2022    PAST SURGICAL HISTORY: Past Surgical History:  Procedure Laterality Date   BILATERAL CARPAL TUNNEL RELEASE     CHOLECYSTECTOMY N/A 08/08/2017   Procedure: LAPAROSCOPIC CHOLECYSTECTOMY;  Surgeon: 08/10/2017, MD;  Location: [facility] OR;  Service: General;  Laterality: N/A;   UPPER GASTROINTESTINAL ENDOSCOPY      FAMILY HISTORY: Family History  Problem Relation Age of Onset   Asthma Mother    Arthritis Mother    Depression Mother    Diabetes Mother    Hypertension Mother    Obesity Mother    Irritable bowel syndrome Mother    Asthma Sister    Learning disabilities Sister    Stroke Sister    Asthma Brother    Depression Brother    Learning disabilities  Brother    Irritable bowel syndrome Brother    Depression Maternal Aunt    Diabetes Maternal Aunt    Mental illness Maternal Aunt    Diabetes Maternal Uncle    Kidney disease Maternal Uncle    Arthritis Maternal Grandmother    Diabetes Maternal Grandmother    Heart  disease Maternal Grandmother    Kidney disease Maternal Grandmother    Hypertension Maternal Grandmother    Hyperlipidemia Maternal Grandmother    Cervical cancer Maternal Grandmother    Stroke Maternal Grandfather    Cancer Other    COPD Other    Heart disease Other    Hypertension Other    Obesity Father    Heart attack Father    Heart disease Paternal Grandmother    Hypertension Paternal Grandmother    Colon cancer Neg Hx    Esophageal cancer Neg Hx    Pancreatic cancer Neg Hx    Stomach cancer Neg Hx     SOCIAL HISTORY: Social History   Socioeconomic History   Marital status: Single    Spouse name: Not on file   Number of children: 0   Years of education: 12   Highest education level: GED or equivalent  Occupational History   Occupation: Furniture conservator/restorer to college   Occupation: Seeking disability  Tobacco Use   Smoking status: Never    Passive exposure: Yes   Smokeless tobacco: Never   Tobacco comments:    mom stopped smoking  Vaping Use   Vaping Use: Never used  Substance and Sexual Activity   Alcohol use: No   Drug use: No   Sexual activity: Never  Other Topics Concern   Not on file  Social History Narrative   Lives with Mom and some siblings, some of whom have children of their own, dropped out of [employer]. plans to start [employer] for GED.   Right-handed.   No daily use of caffeine.    Social Determinants of Health   Financial Resource Strain: Not on file  Food Insecurity: No Food Insecurity (06/08/2020)   Hunger Vital Sign    Worried About Running Out of Food in the Last Year: Never true    Ran Out of Food in the Last Year: Never true  Transportation Needs: Unmet Transportation Needs (06/08/2020)    PRAPARE - Administrator, Civil Service (Medical): Yes    Lack of Transportation (Non-Medical): Yes  Physical Activity: Not on file  Stress: Not on file  Social Connections: Not on file  Intimate Partner Violence: Not on file      Levert Feinstein, M.D. Ph.D.  [facility] [address], [address] [place], [place] [address] Ph: [phone] Fax: [phone]  CC:  Fayette Pho, MD [address] [place],  [place] [address]  Fayette Pho, MD

## 2023-01-11 NOTE — Addendum Note (Signed)
Addended by: Cristela Felt E on: 01/11/2023 01:45 PM   Modules accepted: Orders

## 2023-01-12 ENCOUNTER — Other Ambulatory Visit (HOSPITAL_COMMUNITY): Payer: Self-pay

## 2023-01-12 LAB — THYROID PANEL WITH TSH
Free Thyroxine Index: 2 (ref 1.2–4.9)
T3 Uptake Ratio: 27 % (ref 24–39)
T4, Total: 7.5 ug/dL (ref 4.5–12.0)
TSH: 0.82 u[IU]/mL (ref 0.450–4.500)

## 2023-01-12 LAB — ACETYLCHOLINE RECEPTOR, BINDING: AChR Binding Ab, Serum: <0.03 nmol/L (ref 0.00–0.24)

## 2023-01-12 LAB — C-REACTIVE PROTEIN: CRP: 6 mg/L (ref 0–10)

## 2023-01-12 LAB — SEDIMENTATION RATE: Sed Rate: 16 mm/hr (ref 0–32)

## 2023-01-12 LAB — VITAMIN B12: Vitamin B-12: 638 pg/mL (ref 232–1245)

## 2023-01-12 LAB — ANA W/REFLEX IF POSITIVE: Anti Nuclear Antibody (ANA): NEGATIVE

## 2023-01-12 LAB — VITAMIN D 25 HYDROXY (VIT D DEFICIENCY, FRACTURES): Vit D, 25-Hydroxy: 28.9 ng/mL — ABNORMAL LOW (ref 30.0–100.0)

## 2023-01-18 ENCOUNTER — Encounter: Payer: Self-pay | Admitting: Family Medicine

## 2023-01-22 ENCOUNTER — Telehealth: Payer: Self-pay

## 2023-01-22 NOTE — Telephone Encounter (Signed)
Patient calls nurse line requesting to schedule appointment for chest pains and shortness of breath. She states that this has been going on for three days. Also reports that this is worsened when she takes a deep breath.   Recommended that patient be evaluated in the ED. Patient states that she does not feel that her symptoms warrant ED evaluation. She would like to schedule for tomorrow morning.   She is able to speak in complete sentences at time of conversation.   Patient scheduled appointment for tomorrow morning with Dr. Sabra Heck.   Patient states that she will go to the ED if her symptoms worsen.   Talbot Grumbling, RN

## 2023-01-23 ENCOUNTER — Ambulatory Visit (HOSPITAL_COMMUNITY)
Admission: RE | Admit: 2023-01-23 | Discharge: 2023-01-23 | Disposition: A | Discharge status: Home / Self Care | Payer: Medicaid Other | Source: Ambulatory Visit | Attending: Family Medicine | Admitting: Family Medicine

## 2023-01-23 ENCOUNTER — Other Ambulatory Visit (HOSPITAL_COMMUNITY): Payer: Self-pay

## 2023-01-23 ENCOUNTER — Encounter: Payer: Self-pay | Admitting: Student

## 2023-01-23 ENCOUNTER — Ambulatory Visit (INDEPENDENT_AMBULATORY_CARE_PROVIDER_SITE_OTHER): Payer: Medicaid Other | Admitting: Student

## 2023-01-23 VITALS — BP 112/82 | HR 78 | Wt 266.2 lb

## 2023-01-23 DIAGNOSIS — J4530 Mild persistent asthma, uncomplicated: Secondary | ICD-10-CM

## 2023-01-23 DIAGNOSIS — J4540 Moderate persistent asthma, uncomplicated: Secondary | ICD-10-CM

## 2023-01-23 DIAGNOSIS — R071 Chest pain on breathing: Secondary | ICD-10-CM

## 2023-01-23 DIAGNOSIS — R079 Chest pain, unspecified: Secondary | ICD-10-CM

## 2023-01-23 MED ORDER — ALBUTEROL SULFATE HFA 108 (90 BASE) MCG/ACT IN AERS
2.0000 | INHALATION_SPRAY | Freq: Four times a day (QID) | RESPIRATORY_TRACT | 2 refills | Status: DC | PRN
  Filled 2023-01-23: qty 18, 25d supply, fill #0

## 2023-01-23 MED ORDER — BUDESONIDE-FORMOTEROL FUMARATE 80-4.5 MCG/ACT IN AERO
2.0000 | INHALATION_SPRAY | Freq: Two times a day (BID) | RESPIRATORY_TRACT | 3 refills | Status: DC
  Filled 2023-01-23: qty 10.2, 30d supply, fill #0

## 2023-01-23 NOTE — Progress Notes (Signed)
    SUBJECTIVE:   CHIEF COMPLAINT / HPI:   Sherri [name] is a 22 y.o. female  presenting for workup for new onset chest pain.  She called the nurse line 2/5 reporting chest pain shortness of breath ongoing for 3 days.  Pain is worse when she takes a deep breath.  Patient did not want to be evaluated at the ED and schedule an appointment today.  Asthma: Flovent and albuterol She reports using her albuterol multiple times per day for the last month and has actually run out of her inhaler.  Denies waking up at night due to her symptoms.  She has multiple people in her home that smoke.  She denies tobacco abuse.  Endorses coughing with exertion.  PE Risk Assessment:  Recent travel: denies Birth control: Nexplanon  Denies sedentary lifestyle  No prior hx of DVT/PE  PERTINENT  PMH / PSH: Polypharmacy, migraine without aura, tic disorder with unknown etiology, asthma, IBS, GERD, depression bipolar disorder, anxiety  OBJECTIVE:   BP 112/82   Pulse 78   Wt 266 lb 3.2 oz (120.7 kg)   SpO2 98%   BMI 51.99 kg/m   Ambulates with a cane at baseline Well-appearing, no acute distress Cardio: Regular rate, regular rhythm, no murmurs on exam. Pulm: Coarse breath sounds, mild expiratory wheezing, no crackles. No increased work of breathing Abdominal: bowel sounds present, soft, non-tender, non-distended Extremities: no peripheral edema   MSK: Chest pain reproducible with palpation on her sternum and ribs  EKG: NSR, normal axis without deviation, normal PR interval, no Qtc prolongation, no ST-elevation or depression   ASSESSMENT/PLAN:   Chest pain EKG normal, no cardiac risk factors.  DVT screening low risk.  Low suspicion for PE with normal HR, reproducible chest pain on exam.  Most likely pain is due to her asthma.   Asthma, chronic Multiple risk factors at home for exacerbating her asthma.  Will escalate her therapy to Symbicort and discontinue Flovent. - Prescribe Symbicort 80-4.5 -  Refilled albuterol - Follow-up in 3 months to assess symptom control     Darci Current, DO [place]

## 2023-01-23 NOTE — Assessment & Plan Note (Signed)
Multiple risk factors at home for exacerbating her asthma.  Will escalate her therapy to Symbicort and discontinue Flovent. - Prescribe Symbicort 80-4.5 - Refilled albuterol - Follow-up in 3 months to assess symptom control

## 2023-01-23 NOTE — Assessment & Plan Note (Addendum)
EKG normal, no cardiac risk factors.  DVT screening low risk.  Low suspicion for PE with normal HR, reproducible chest pain on exam.  Most likely pain is due to her asthma.

## 2023-01-23 NOTE — Patient Instructions (Signed)
It was great to see you today!   Today we addressed: Chest pain, this appears to be related to your asthma since you are using your albuterol so much that you are running out.  I am starting you on a new inhaler called Symbicort.  You should take 2 puffs twice a day every day.  My goal for you is to only use your albuterol inhaler 1 time per week. We did an EKG which is a test looking at your heart rhythm which was completely normal in the office.  You should return to our clinic Return in about 3 months (around 04/23/2023).  Please arrive 15 minutes before your appointment to ensure smooth check in process.    Please call the clinic at [phone] if your symptoms worsen or you have any concerns.  Thank you for allowing me to participate in your care, Dr. Darci Current [facility]

## 2023-01-26 ENCOUNTER — Other Ambulatory Visit (HOSPITAL_COMMUNITY): Payer: Self-pay

## 2023-01-31 ENCOUNTER — Telehealth: Payer: Self-pay

## 2023-01-31 NOTE — Telephone Encounter (Signed)
Amira from [facility] calls nurse line in regards to patients up coming apt in [place].   She reports their services are only for "in county" travel. She reports in order for them to transport her to [facility], for upcoming Neurology apt, a form will need to be filled out by PCP.  She reports she is faxing this over today.   Will forward to PCP.

## 2023-02-01 ENCOUNTER — Telehealth (HOSPITAL_COMMUNITY): Payer: Medicaid Other | Admitting: Student in an Organized Health Care Education/Training Program

## 2023-02-01 ENCOUNTER — Other Ambulatory Visit (HOSPITAL_COMMUNITY): Payer: Self-pay

## 2023-02-01 ENCOUNTER — Other Ambulatory Visit: Payer: Self-pay

## 2023-02-01 MED ORDER — CLONAZEPAM 0.5 MG PO TABS
0.5000 mg | ORAL_TABLET | Freq: Every evening | ORAL | 3 refills | Status: DC
  Filled 2023-02-20 – 2023-04-24 (×3): qty 30, 30d supply, fill #0

## 2023-02-01 MED ORDER — CLONAZEPAM 0.5 MG PO TABS
0.5000 mg | ORAL_TABLET | Freq: Every evening | ORAL | 3 refills | Status: DC
  Filled 2023-02-01: qty 30, 30d supply, fill #0
  Filled 2023-02-27: qty 30, 30d supply, fill #1
  Filled 2023-04-05: qty 30, 30d supply, fill #2
  Filled 2023-04-24 – 2023-05-16 (×3): qty 30, 30d supply, fill #3

## 2023-02-02 ENCOUNTER — Telehealth (INDEPENDENT_AMBULATORY_CARE_PROVIDER_SITE_OTHER): Payer: Medicaid Other | Admitting: Family Medicine

## 2023-02-02 ENCOUNTER — Other Ambulatory Visit (HOSPITAL_COMMUNITY): Payer: Self-pay

## 2023-02-02 DIAGNOSIS — J4540 Moderate persistent asthma, uncomplicated: Secondary | ICD-10-CM

## 2023-02-02 DIAGNOSIS — J4530 Mild persistent asthma, uncomplicated: Secondary | ICD-10-CM

## 2023-02-02 DIAGNOSIS — Z713 Dietary counseling and surveillance: Secondary | ICD-10-CM | POA: Diagnosis not present

## 2023-02-02 MED ORDER — BUDESONIDE-FORMOTEROL FUMARATE 160-4.5 MCG/ACT IN AERO
2.0000 | INHALATION_SPRAY | Freq: Two times a day (BID) | RESPIRATORY_TRACT | 3 refills | Status: DC
  Filled 2023-02-02: qty 10.2, 30d supply, fill #0
  Filled 2023-02-27: qty 10.2, 30d supply, fill #1
  Filled 2023-04-24: qty 10.2, 30d supply, fill #2
  Filled 2023-05-28 – 2023-05-29 (×2): qty 10.2, 30d supply, fill #3

## 2023-02-02 MED ORDER — ALBUTEROL SULFATE HFA 108 (90 BASE) MCG/ACT IN AERS
2.0000 | INHALATION_SPRAY | Freq: Four times a day (QID) | RESPIRATORY_TRACT | 4 refills | Status: DC | PRN
  Filled 2023-02-02 – 2023-02-27 (×2): qty 18, 25d supply, fill #0
  Filled 2023-05-11 – 2023-05-16 (×2): qty 18, 25d supply, fill #1
  Filled 2023-07-08: qty 18, 25d supply, fill #2
  Filled 2023-07-19: qty 18, 25d supply, fill #0
  Filled 2023-08-23 (×4): qty 18, 25d supply, fill #1
  Filled 2023-09-17 – 2023-09-20 (×2): qty 18, 25d supply, fill #2

## 2023-02-02 MED ORDER — MONTELUKAST SODIUM 10 MG PO TABS
10.0000 mg | ORAL_TABLET | Freq: Every day | ORAL | 3 refills | Status: DC
  Filled 2023-02-02: qty 90, 90d supply, fill #0
  Filled 2023-04-05 – 2023-04-24 (×2): qty 90, 90d supply, fill #1
  Filled 2023-07-27: qty 90, 90d supply, fill #2
  Filled 2023-10-25: qty 90, 90d supply, fill #3

## 2023-02-02 NOTE — Progress Notes (Signed)
Sherri [name] Patient consented to have virtual [name] and was identified by name and date of birth. Method of [name]: Video was attempted but was interrupted: <50% of [name] completed via video  Encounter participants: Patient: Sherri [name] - located at home Provider: Ezequiel Essex - located at [facility] Others (if applicable): none  Chief Complaint: Weight loss desired, asthma flare up  HPI: Ms. Sherri [name] is a 22 yo woman presenting to discuss desired weight loss and asthma flare.   Weight loss desired This has been discussed at previous visits. We are a little limited on what we can prescribe here given her other conditions and medications. Insurance would not cover [employer] generic formulation. Failed Ozempic due to stomach upset. Previously tried Topamax without success. Would not use stimulants as she has diagnosis of anxiety. Would not use other anti-convulsants as she has tic and movement disorder currently being worked up, now under the care of [facility] specialty clinic. Already on wellbutrin and metformin. Tolerated wellbutrin well, but no documented weight loss. Metformin - she has lots of stomach issues at baseline and does not know if metformin is making these worse or not. Previously referred her to bariatric surgery, however we placed the referral at a clinic in [place]. She has transportation barriers and can only use Medicaid transportation to get to health appointments within [place] and nearby surrounding areas.   Asthma flare Started end of January. Was seen at [facility] 01/23/2023, prescribed Symbicort 80-4.5 and refilled albuterol.   Since then, patient reports not much relief. She is still struggling to breathe during exertion and sometimes at rest. Using albuterol rescue TID.   ROS: per HPI  Pertinent PMHx:  Patient Active Problem List   Diagnosis Date Noted   Bipolar disorder ([place]) 09/22/2022    Priority: High   Depression, recurrent  ([place]) 05/15/2022    Priority: High   Morbid obesity ([place]) 07/29/2013    Priority: High   Asthma, chronic 07/14/2013    Priority: High   Weakness 01/11/2023   Depression with anxiety 01/11/2023   Muscle ache 08/14/2022   Weight loss counseling, encounter for 06/14/2022   Gait disturbance 04/23/2022   Muscle weakness of all 4 extremities 04/13/2022   Major depression 10/07/2021   Abnormal uterine bleeding (AUB) 05/12/2021   Movement disorder 04/14/2021   Macromastia 03/16/2021   Chest pain 03/16/2021   Poor appetite 03/16/2021   Hand pain, not arthralgia 02/16/2021   Healthcare maintenance 01/29/2021   Tic disorder, unspecified 08/10/2020   Hirsutism 02/26/2020   Migraine without aura and without status migrainosus, not intractable 02/10/2020   Irritable bowel syndrome 02/10/2020   Chronic nonintractable headache 02/02/2020   Pre-diabetes 07/16/2019   Breast pain 07/16/2019   Sleeping difficulty 07/16/2019   Menorrhagia with irregular cycle 07/16/2019   Chronic low back pain without sciatica 04/26/2017   Gastroesophageal reflux  07/29/2015   Vitamin D deficiency 07/29/2013   Allergic rhinitis 07/14/2013    Exam:  There were no vitals taken for this [name].  Respiratory: speaking clearly in full sentences  Assessment/Plan:  Weight loss counseling, encounter for Patient desires trial of Trulicity, as this is working well for her mom. While patient has numerous risk factors for metabolic syndrome and diabetes, she has no documented diabetes in chart. Will order future labs to assess insulin resistance. Refer to bariatric clinic closer to home.   Asthma, chronic Asthma not well controlled, patient using rescue inhaler TID.  - increase Symbicort to 160-4.5 dose  BID - refill albuterol - start montelukast - follow up and return precautions given   Time spent during [name] with patient: 20 minutes  Ezequiel Essex, MD  PGY-3 [facility]

## 2023-02-04 ENCOUNTER — Emergency Department (HOSPITAL_BASED_OUTPATIENT_CLINIC_OR_DEPARTMENT_OTHER): Payer: Medicaid Other | Admitting: Radiology

## 2023-02-04 ENCOUNTER — Other Ambulatory Visit: Payer: Self-pay

## 2023-02-04 ENCOUNTER — Emergency Department (HOSPITAL_BASED_OUTPATIENT_CLINIC_OR_DEPARTMENT_OTHER)
Admission: EM | Admit: 2023-02-04 | Discharge: 2023-02-04 | Disposition: A | Discharge status: Home / Self Care | Payer: Medicaid Other | Attending: Emergency Medicine | Admitting: Emergency Medicine

## 2023-02-04 ENCOUNTER — Encounter (HOSPITAL_BASED_OUTPATIENT_CLINIC_OR_DEPARTMENT_OTHER): Payer: Self-pay | Admitting: Emergency Medicine

## 2023-02-04 DIAGNOSIS — R079 Chest pain, unspecified: Secondary | ICD-10-CM | POA: Diagnosis present

## 2023-02-04 DIAGNOSIS — D75839 Thrombocytosis, unspecified: Secondary | ICD-10-CM | POA: Diagnosis not present

## 2023-02-04 DIAGNOSIS — Z7722 Contact with and (suspected) exposure to environmental tobacco smoke (acute) (chronic): Secondary | ICD-10-CM | POA: Insufficient documentation

## 2023-02-04 DIAGNOSIS — Z1152 Encounter for screening for COVID-19: Secondary | ICD-10-CM | POA: Diagnosis not present

## 2023-02-04 DIAGNOSIS — J45901 Unspecified asthma with (acute) exacerbation: Secondary | ICD-10-CM

## 2023-02-04 LAB — CBC
HCT: 37.8 % (ref 36.0–46.0)
Hemoglobin: 12.2 g/dL (ref 12.0–15.0)
MCH: 27.5 pg (ref 26.0–34.0)
MCHC: 32.3 g/dL (ref 30.0–36.0)
MCV: 85.3 fL (ref 80.0–100.0)
Platelets: 423 10*3/uL — ABNORMAL HIGH (ref 150–400)
RBC: 4.43 MIL/uL (ref 3.87–5.11)
RDW: 14.3 % (ref 11.5–15.5)
WBC: 8.6 10*3/uL (ref 4.0–10.5)
nRBC: 0 % (ref 0.0–0.2)

## 2023-02-04 LAB — BASIC METABOLIC PANEL
Anion gap: 8 (ref 5–15)
BUN: 10 mg/dL (ref 6–20)
CO2: 27 mmol/L (ref 22–32)
Calcium: 9.6 mg/dL (ref 8.9–10.3)
Chloride: 103 mmol/L (ref 98–111)
Creatinine, Ser: 0.75 mg/dL (ref 0.44–1.00)
GFR, Estimated: >60 mL/min (ref >60)
Glucose, Bld: 81 mg/dL (ref 70–99)
Potassium: 4.1 mmol/L (ref 3.5–5.1)
Sodium: 138 mmol/L (ref 135–145)

## 2023-02-04 LAB — RESP PANEL BY RT-PCR (RSV, FLU A&B, COVID)  RVPGX2
Influenza A by PCR: NEGATIVE
Influenza B by PCR: NEGATIVE
Resp Syncytial Virus by PCR: NEGATIVE
SARS Coronavirus 2 by RT PCR: NEGATIVE

## 2023-02-04 LAB — TROPONIN I (HIGH SENSITIVITY): Troponin I (High Sensitivity): <2 ng/L (ref <18)

## 2023-02-04 LAB — D-DIMER, QUANTITATIVE: D-Dimer, Quant: <0.27 ug/mL-FEU (ref 0.00–0.50)

## 2023-02-04 LAB — PREGNANCY, URINE: Preg Test, Ur: NEGATIVE

## 2023-02-04 MED ORDER — PREDNISONE 10 MG PO TABS: 40.0000 mg | ORAL_TABLET | Freq: Every day | ORAL | 0 refills | Status: DC

## 2023-02-04 MED ORDER — PREDNISONE 20 MG PO TABS
40.0000 mg | ORAL_TABLET | Freq: Once | ORAL | Status: AC
  Administered 2023-02-04: 40 mg via ORAL
  Filled 2023-02-04: qty 2

## 2023-02-04 MED ORDER — IPRATROPIUM-ALBUTEROL 0.5-2.5 (3) MG/3ML IN SOLN
3.0000 mL | Freq: Once | RESPIRATORY_TRACT | Status: AC
  Administered 2023-02-04: 3 mL via RESPIRATORY_TRACT
  Filled 2023-02-04: qty 3

## 2023-02-04 MED ORDER — ALBUTEROL SULFATE (2.5 MG/3ML) 0.083% IN NEBU: 2.5000 mg | INHALATION_SOLUTION | Freq: Four times a day (QID) | RESPIRATORY_TRACT | 12 refills | Status: DC | PRN

## 2023-02-04 NOTE — ED Provider Notes (Signed)
[facility] Provider Note   CSN: [id number] Arrival date & time: 02/04/23  1139     History  Chief Complaint  Patient presents with   Chest Pain    Sherri [name] is a 22 y.o. female.   Chest Pain   22 year old female presents emergency department with complaints of chest pain, shortness of breath.  Patient states symptoms been present since January 25 of this year.  Reports chest pain as sternal just left of sternal worsened with taking a deep breath as well as with exertion.  Also reports shortness of breath worsened with exertion.  States he has a history of asthma and states that symptoms have improved some with breathing treatments at home.  Was most recently prescribed montelukast as well of which she states has not been helping.  Was seen in urgent care and sent to the emergency department for further evaluation.  Upon further family history, extensive history of DVT/PE.  62 year old brother, mother, grandfather all with history of DVT/PE on anticoagulation.  Mother is undergoing tests currently for possible underlying coagulopathy.  Patient currently on Nexplanon with recent wisdom tooth extraction earlier this month.  Denies any travel, prolonged sitting, known malignancy.  Patient also reports cough persistent over duration of symptoms.  Denies fever, chills, abdominal pain, nausea, vomiting, urinary symptoms, change in bowel habits.  Past medical history significant for tic disorder, chest pain, PCOS, cough, obesity, bipolar disorder, migraine, GERD  Home Medications Prior to Admission medications   Medication Sig Start Date End Date Taking? Authorizing Provider  albuterol (PROVENTIL) (2.5 MG/3ML) 0.083% nebulizer solution Take 3 mLs (2.5 mg total) by nebulization every 6 (six) hours as needed for wheezing or shortness of breath. 02/04/23  Yes Dion Saucier A, PA  predniSONE (DELTASONE) 10 MG tablet Take 4 tablets (40 mg total) by  mouth daily. 02/05/23  Yes Dion Saucier A, PA  acetaminophen (TYLENOL) 500 MG tablet Take 1 tablet (500 mg total) by mouth every 4-6 hours as needed. 12/27/22     albuterol (VENTOLIN HFA) 108 (90 Base) MCG/ACT inhaler Inhale 2 puffs into the lungs every 6 (six) hours as needed for wheezing or shortness of breath. 02/02/23   Ezequiel Essex, MD  budesonide-formoterol [facility]) 160-4.5 MCG/ACT inhaler Inhale 2 puffs into the lungs 2 (two) times daily. 02/02/23   Ezequiel Essex, MD  chlorhexidine (PERIDEX) 0.12 % solution Rinse mouth with 15 mLs ( 1 capful) for 30 seconds in the morning and evening after toothbrushing. Expectorate after rinsing, DO NOT SWALLOW 12/27/22     cholecalciferol (VITAMIN D3) 25 MCG (1000 UNIT) tablet Take 1 tablet (1,000 Units total) by mouth daily. Return for vit D recheck when this bottle runs out. 08/15/22   Ezequiel Essex, MD  clonazePAM (KLONOPIN) 0.5 MG tablet Take 1 tablet (0.5 mg total) by mouth at bedtime. 02/01/23     clonazePAM (KLONOPIN) 0.5 MG tablet Take 1 tablet (0.5 mg total) by mouth Nightly. 02/01/23     cyanocobalamin (VITAMIN B12) 1000 MCG tablet Take 1 tablet (1,000 mcg total) by mouth daily. Return for vit B12 recheck when this bottle runs out. 08/15/22   Ezequiel Essex, MD  etonogestrel (NEXPLANON) 68 MG IMPL implant 1 each (68 mg total) by Subdermal route once.    Trude Mcburney, FNP  ibuprofen (ADVIL) 800 MG tablet Take 1 tablet (800 mg total) by mouth every 6 (six) hours as needed for pain. 01/01/23     linaclotide (LINZESS) 72 MCG capsule  Take 1 capsule (72 mcg total) by mouth daily before breakfast. 11/20/22 11/15/23  Vladimir Crofts, PA-C  melatonin 3 MG TABS tablet Take 1 tablet (3 mg total) by mouth at bedtime. 09/06/22   Salley Slaughter, NP  metFORMIN (GLUCOPHAGE) 500 MG tablet Take 1 tablet (500 mg total) by mouth 2 (two) times daily with a meal. 05/25/22   Autry-Lott, Naaman Plummer, DO  montelukast (SINGULAIR) 10 MG tablet Take 1 tablet (10 mg  total) by mouth at bedtime. 02/02/23   Ezequiel Essex, MD  pantoprazole (PROTONIX) 20 MG tablet Take 1 tablet (20 mg total) by mouth daily. 10/24/22   Ezequiel Essex, MD  penicillin v potassium (VEETID) 500 MG tablet Take 1 tablet (500 mg total) by mouth 4 (four) times daily until gone 12/27/22     risperiDONE (RISPERDAL) 0.5 MG tablet Take 1 tablet (0.5 mg total) by mouth 2 (two) times daily. 12/08/22   Freida Busman, MD  sertraline (ZOLOFT) 50 MG tablet Take 1 tablet (50 mg total) by mouth daily. 12/08/22   Freida Busman, MD  Spacer/Aero Chamber Mouthpiece MISC 1 Device by Does not apply route as needed. 11/21/17   Ann Maki, MD  SUMAtriptan (IMITREX) 25 MG tablet Take 1 tablet (25 mg total) by mouth every 2 (two) hours as needed for migraine. May repeat in 2 hours if headache persists or recurs. Maximum 200MG in 24 hours 11/06/22   Erskine Emery, MD  topiramate (TOPAMAX) 25 MG tablet 25 mg orally once daily in the evening for first week, 25 mg twice daily for second week, 25 mg in the morning and 50 mg in the evening for third week, and then 50 mg twice daily 11/06/22   Erskine Emery, MD  traZODone (DESYREL) 50 MG tablet Take 1 tablet (50 mg total) by mouth at bedtime. 12/08/22   Freida Busman, MD  triamcinolone cream in Minerin Creme Apply 1 application topically 2 (two) times daily. Do not use for more than 7 consecutive days 07/25/22   Ezequiel Essex, MD      Allergies    Ibuprofen and Shellfish allergy    Review of Systems   Review of Systems  Cardiovascular:  Positive for chest pain.  All other systems reviewed and are negative.   Physical Exam Updated Vital Signs BP 136/78 (BP Location: Right Arm)   Pulse 70   Temp 98.1 F (36.7 C) (Oral)   Resp 20   SpO2 99%  Physical Exam Vitals and nursing note reviewed.  Constitutional:      General: She is not in acute distress.    Appearance: She is well-developed.  HENT:     Head: Normocephalic and atraumatic.     Nose:  Congestion and rhinorrhea present.     Mouth/Throat:     Pharynx: No oropharyngeal exudate or posterior oropharyngeal erythema.  Eyes:     Conjunctiva/sclera: Conjunctivae normal.  Cardiovascular:     Rate and Rhythm: Normal rate and regular rhythm.     Heart sounds: No murmur heard. Pulmonary:     Effort: Pulmonary effort is normal. No respiratory distress.     Breath sounds: Wheezing and rhonchi present. No rales.     Comments: Mild wheeze/rhonchi auscultated in mid to upper lung fields. Abdominal:     Palpations: Abdomen is soft.     Tenderness: There is no abdominal tenderness.  Musculoskeletal:        General: No swelling.     Cervical back: Neck supple.  Right lower leg: No edema.     Left lower leg: No edema.  Skin:    General: Skin is warm and dry.     Capillary Refill: Capillary refill takes less than 2 seconds.  Neurological:     Mental Status: She is alert.  Psychiatric:        Mood and Affect: Mood normal.     ED Results / Procedures / Treatments   Labs (all labs ordered are listed, but only abnormal results are displayed) Labs Reviewed  CBC - Abnormal; Notable for the following components:      Result Value   Platelets 423 (*)    All other components within normal limits  RESP PANEL BY RT-PCR (RSV, FLU A&B, COVID)  [id number]  BASIC METABOLIC PANEL  PREGNANCY, URINE  D-DIMER, QUANTITATIVE  TROPONIN I (HIGH SENSITIVITY)    EKG None  Radiology DG Chest 2 View  Result Date: 02/04/2023 CLINICAL DATA:  Mid chest pain and shortness of breath. EXAM: CHEST - 2 VIEW COMPARISON:  04/03/2022 and older exams. FINDINGS: Normal heart, mediastinum and hila. Lungs are clear. No pleural effusion or pneumothorax. Skeletal structures are within normal limits. IMPRESSION: Normal chest radiographs. Electronically Signed   By: Lajean Manes M.D.   On: 02/04/2023 12:30    Procedures Procedures    Medications Ordered in ED Medications  ipratropium-albuterol (DUONEB)  0.5-2.5 (3) MG/3ML nebulizer solution 3 mL (has no administration in time range)  predniSONE (DELTASONE) tablet 40 mg (has no administration in time range)    ED Course/ Medical Decision Making/ A&P Clinical Course as of 02/04/23 1439  Sun Feb 04, 2023  1258 Jan 25 [CR]    Clinical Course User Index [CR] Wilnette Kales, PA                             Medical Decision Making Amount and/or Complexity of Data Reviewed Labs: ordered. Radiology: ordered.  Risk Prescription drug management.   This patient presents to the ED for concern of chest pain, shortness of breath, this involves an extensive number of treatment options, and is a complaint that carries with it a high risk of complications and morbidity.  The differential diagnosis includes ACS, PE, pneumothorax, pneumonia, aortic dissection, aortic aneurysm, musculoskeletal, COPD/asthma exacerbation, CHF, anemia   Co morbidities that complicate the patient evaluation  See HPI   Additional history obtained:  Additional history obtained from EMR External records from outside source obtained and reviewed including hospital records   Lab Tests:  I Ordered, and personally interpreted labs.  The pertinent results include: No leukocytosis noted.  No evidence anemia.  Mild elevation in platelets of 423.  No electrolyte abnormalities.  No renal dysfunction.  Urine pregnancy negative.  Initial troponin of less than 2; EKG concerning for sinus arrhythmia without acute ischemic changes; given duration of symptoms, doubt ACS.  D-dimer negative so doubt PE.  Respiratory viral panel pending.   Imaging Studies ordered:  I ordered imaging studies including chest x-ray I independently visualized and interpreted imaging which showed no acute cardiopulmonary abnormalities I agree with the radiologist interpretation   Cardiac Monitoring: / EKG:  The patient was maintained on a cardiac monitor.  I personally viewed and interpreted the  cardiac monitored which showed an underlying rhythm of: Sinus arrhythmia without acute ischemic changes   Consultations Obtained:  N/a   Problem List / ED Course / Critical interventions / Medication management  Chest pain/asthma  I ordered medication including Adderall, prednisone   Reevaluation of the patient after these medicines showed that the patient improved I have reviewed the patients home medicines and have made adjustments as needed   Social Determinants of Health:  Passive smoke exposure.  Denies tobacco/illicit drug use personally   Test / Admission - Considered:  Chest pain/asthma Vitals signs within normal range and stable throughout visit. Laboratory/imaging studies significant for: See above Patient symptoms of chest pain and shortness of breath most likely secondary to asthma given wheeze/rhonchi appreciated on respiratory exam dissociated with chest tightness.  Patient noted improvement of symptoms with breathing therapy and steroids administered while emergency department.  Continue outpatient medicines recommended at the same.  Doubt ACS given negative troponin and lack of EKG changes.  Doubt PE given negative D-dimer, lack of tachycardia, afebrile.  Doubt pneumothorax, pneumonia, dissection, aortic aneurysm.  Will send home prednisone as well as albuterol nebulizer for treatment of patient's symptoms.  Recommend follow-up with primary care for reassessment of symptoms.  Treatment plan discussed at length with patient and family and they acknowledge understanding were agreeable to said plan. Worrisome signs and symptoms were discussed with the patient, and the patient acknowledged understanding to return to the ED if noticed. Patient was stable upon discharge.          Final Clinical Impression(s) / ED Diagnoses Final diagnoses:  Chest pain, unspecified type  Exacerbation of asthma, unspecified asthma severity, unspecified whether persistent    Rx / DC  Orders ED Discharge Orders          Ordered    predniSONE (DELTASONE) 10 MG tablet  Daily        02/04/23 1434    albuterol (PROVENTIL) (2.5 MG/3ML) 0.083% nebulizer solution  Every 6 hours PRN        02/04/23 1434              Wilnette Kales, [place] 02/04/23 1439    Charlesetta Shanks, MD 02/08/23 1546

## 2023-02-04 NOTE — Discharge Instructions (Addendum)
Note the workup today was overall reassuring.  As discussed, chest x-ray without signs of pneumonia, collapsed lung, infectious process.  EKG was within normal limits.  D-dimer was negative so very low suspicion for blood clot in the lung or leg.  Heart enzymes are normal so doubt heart attack.  Symptoms most likely secondary to asthmatic exacerbation.  Will treat with at home nebulizer as well as short course of steroids for the next several days.  Follow MyChart for the results of your COVID/flu/RSV test.  Recommend follow-up with primary care for reassessment of your symptoms.  Please not hesitate to return to emergency department for worrisome signs and symptoms we discussed become apparent.

## 2023-02-04 NOTE — ED Triage Notes (Signed)
Pt presents to ED Pov. Pt c/o CP and Sob since 1/25. Pt reports CP central and constant. Pt reports cp worsens with breathing and at night. Pt reports that she is having to use her rescue inhaler more frequently.

## 2023-02-04 NOTE — ED Notes (Signed)
Dc instructions reviewed with patient. Patient voiced understanding. Dc with belongings.  °

## 2023-02-05 ENCOUNTER — Other Ambulatory Visit: Payer: Self-pay

## 2023-02-05 NOTE — Telephone Encounter (Signed)
Received returned call from [facility] regarding form. Form was faxed to incorrect number on Friday. Refaxed to provided number.   Talbot Grumbling, RN

## 2023-02-08 NOTE — Telephone Encounter (Signed)
Sherri [name] calls nurse line again in regards to form.   She reports they are only receiving the cover page.  Form refaxed.

## 2023-02-11 NOTE — Assessment & Plan Note (Signed)
>>  ASSESSMENT AND PLAN FOR WEIGHT LOSS COUNSELING, ENCOUNTER FOR WRITTEN ON 02/11/2023  7:13 PM BY Fayette Pho, MD  Patient desires trial of Trulicity, as this is working well for her mom. While patient has numerous risk factors for metabolic syndrome and diabetes, she has no documented diabetes in chart. Will order future labs to assess insulin resistance. Refer to bariatric clinic closer to home.

## 2023-02-11 NOTE — Assessment & Plan Note (Signed)
Asthma not well controlled, patient using rescue inhaler TID.  - increase Symbicort to 160-4.5 dose BID - refill albuterol - start montelukast - follow up and return precautions given

## 2023-02-11 NOTE — Assessment & Plan Note (Signed)
Patient desires trial of Trulicity, as this is working well for her mom. While patient has numerous risk factors for metabolic syndrome and diabetes, she has no documented diabetes in chart. Will order future labs to assess insulin resistance. Refer to bariatric clinic closer to home.

## 2023-02-13 ENCOUNTER — Encounter: Payer: Self-pay | Admitting: Family Medicine

## 2023-02-20 ENCOUNTER — Other Ambulatory Visit: Payer: Self-pay

## 2023-02-20 ENCOUNTER — Other Ambulatory Visit: Payer: Self-pay | Admitting: Family Medicine

## 2023-02-20 ENCOUNTER — Other Ambulatory Visit: Payer: Medicaid Other

## 2023-02-20 ENCOUNTER — Other Ambulatory Visit (HOSPITAL_COMMUNITY): Payer: Self-pay

## 2023-02-20 DIAGNOSIS — R7303 Prediabetes: Secondary | ICD-10-CM

## 2023-02-20 MED ORDER — METFORMIN HCL 500 MG PO TABS
500.0000 mg | ORAL_TABLET | Freq: Two times a day (BID) | ORAL | 3 refills | Status: DC
  Filled 2023-02-20: qty 180, 90d supply, fill #0
  Filled 2023-05-16: qty 180, 90d supply, fill #1
  Filled 2023-08-16: qty 180, 90d supply, fill #2
  Filled 2023-12-08 – 2023-12-13 (×2): qty 180, 90d supply, fill #3

## 2023-02-20 MED ORDER — PANTOPRAZOLE SODIUM 20 MG PO TBEC
20.0000 mg | DELAYED_RELEASE_TABLET | Freq: Every day | ORAL | 3 refills | Status: DC
  Filled 2023-02-20: qty 30, 30d supply, fill #0
  Filled 2023-03-25: qty 30, 30d supply, fill #1

## 2023-02-22 ENCOUNTER — Other Ambulatory Visit (HOSPITAL_COMMUNITY): Payer: Self-pay

## 2023-02-23 ENCOUNTER — Telehealth (HOSPITAL_COMMUNITY): Payer: Medicaid Other | Admitting: Student in an Organized Health Care Education/Training Program

## 2023-02-23 NOTE — Progress Notes (Deleted)
    SUBJECTIVE:   CHIEF COMPLAINT / HPI:   Nexplanon removed and replaced  PERTINENT  PMH / PSH: ***  OBJECTIVE:   There were no vitals taken for this visit. ***  General: NAD, pleasant, able to participate in exam Cardiac: RRR, no murmurs. Respiratory: CTAB, normal effort, No wheezes, rales or rhonchi Abdomen: Bowel sounds present, nontender, nondistended Extremities: no edema or cyanosis. Skin: warm and dry, no rashes noted Neuro: alert, no obvious focal deficits Psych: Normal affect and mood  ASSESSMENT/PLAN:   No problem-specific Assessment & Plan notes found for this encounter.     Dr. Colletta [place], [place]    {    This will disappear when note is signed, click to select method of visit    :1}

## 2023-02-27 ENCOUNTER — Other Ambulatory Visit: Payer: Self-pay

## 2023-02-27 ENCOUNTER — Other Ambulatory Visit (HOSPITAL_COMMUNITY): Payer: Self-pay

## 2023-03-01 ENCOUNTER — Ambulatory Visit (INDEPENDENT_AMBULATORY_CARE_PROVIDER_SITE_OTHER): Payer: Medicaid Other | Admitting: Family Medicine

## 2023-03-01 ENCOUNTER — Other Ambulatory Visit: Payer: Medicaid Other

## 2023-03-01 ENCOUNTER — Encounter: Payer: Self-pay | Admitting: Family Medicine

## 2023-03-01 ENCOUNTER — Other Ambulatory Visit (HOSPITAL_COMMUNITY): Payer: Self-pay

## 2023-03-01 VITALS — BP 128/71 | HR 83 | Ht 60.0 in | Wt 287.2 lb

## 2023-03-01 DIAGNOSIS — K589 Irritable bowel syndrome without diarrhea: Secondary | ICD-10-CM

## 2023-03-01 DIAGNOSIS — Z975 Presence of (intrauterine) contraceptive device: Secondary | ICD-10-CM

## 2023-03-01 DIAGNOSIS — Z3046 Encounter for surveillance of implantable subdermal contraceptive: Secondary | ICD-10-CM | POA: Diagnosis present

## 2023-03-01 MED ORDER — ONDANSETRON 4 MG PO TBDP
4.0000 mg | ORAL_TABLET | Freq: Three times a day (TID) | ORAL | 0 refills | Status: DC | PRN
  Filled 2023-03-01: qty 20, 7d supply, fill #0

## 2023-03-01 MED ORDER — DICYCLOMINE HCL 10 MG PO CAPS
10.0000 mg | ORAL_CAPSULE | Freq: Two times a day (BID) | ORAL | 0 refills | Status: DC | PRN
  Filled 2023-03-01: qty 60, 30d supply, fill #0

## 2023-03-01 NOTE — Progress Notes (Signed)
Nexplanon last placed 03/01/2020.  Due for removal and reinsertion.  Also requesting refill on dicyclomine and ondansetron for IBS.  Will be going on a trip soon.  Not having any problems currently.  PROCEDURE NOTE: NEXPLANON  REMOVAL and REINSERTION  REMOVAL Patient given informed consent and signed copy in the chart for both procedures. an appropriate time out was been taken  Left  arm area prepped and draped in the usual sterile fashion. Three cc of 1% lidocaine without epinephrine used for local anesthesia. A small stab incision was made close to the nexplanon with scalpel. Hemostats were used to withdraw the nexplanon  INSERTION new NEXPLANON  Prior to the removal procedure, an appropriate time out had been taken and  there were no breaks in patient care between the procedures. Sterile field had been maintained as well as local anesthesia.   Nexplanon was inserted in typical fashion in the  LEFT ARM (same site previously used) and where removal was performed. There were no complications.  Pressure bandage was  applied to decrease bruising. Patient given follow up instructions should she experience redness, swelling at sight or fever in the next 24 hours. Patient given Nexplanon pocket card.

## 2023-03-01 NOTE — Patient Instructions (Addendum)
It was nice seeing you today!  You can take the outer bandage off after 12 hours.  The smaller bandages should removed after 3 to 5 days.  You can take over-the-counter medication as needed for pain.  Let me know if you have any issues.  Nexplanon will be good for another 3 years.  Stay well, Zola Button, MD [place] [phone]  --  Make sure to check out at the front desk before you leave today.  Please arrive at least 15 minutes prior to your scheduled appointments.  If you had blood work today, I will send you a MyChart message or a letter if results are normal. Otherwise, I will give you a call.  If you had a referral placed, they will call you to set up an appointment. Please give [place] a call if you don't hear back in the next 2 weeks.  If you need additional refills before your next appointment, please call your pharmacy first.

## 2023-03-02 ENCOUNTER — Other Ambulatory Visit: Payer: Medicaid Other

## 2023-03-02 ENCOUNTER — Ambulatory Visit: Payer: Medicaid Other | Admitting: Family Medicine

## 2023-03-02 LAB — HEMOGLOBIN A1C
Est. average glucose Bld gHb Est-mCnc: 123 mg/dL
Hgb A1c MFr Bld: 5.9 % — ABNORMAL HIGH (ref 4.8–5.6)

## 2023-03-02 LAB — GLUCOSE, RANDOM: Glucose: 68 mg/dL — ABNORMAL LOW (ref 70–99)

## 2023-03-02 LAB — INSULIN, RANDOM: INSULIN: 22.7 u[IU]/mL (ref 2.6–24.9)

## 2023-03-02 LAB — LIPID PANEL
Chol/HDL Ratio: 2.4 ratio (ref 0.0–4.4)
Cholesterol, Total: 176 mg/dL (ref 100–199)
HDL: 73 mg/dL (ref >39)
LDL Chol Calc (NIH): 93 mg/dL (ref 0–99)
Triglycerides: 50 mg/dL (ref 0–149)
VLDL Cholesterol Cal: 10 mg/dL (ref 5–40)

## 2023-03-04 ENCOUNTER — Encounter: Payer: Self-pay | Admitting: Family Medicine

## 2023-03-07 ENCOUNTER — Other Ambulatory Visit (HOSPITAL_COMMUNITY): Payer: Self-pay

## 2023-03-15 MED ORDER — ETONOGESTREL 68 MG ~~LOC~~ IMPL
68.0000 mg | DRUG_IMPLANT | Freq: Once | SUBCUTANEOUS | Status: AC
  Administered 2023-03-01: 68 mg via SUBCUTANEOUS

## 2023-03-15 NOTE — Addendum Note (Signed)
Addended by: Talbot Grumbling on: 03/15/2023 12:51 PM   Modules accepted: Orders

## 2023-03-25 ENCOUNTER — Other Ambulatory Visit (HOSPITAL_COMMUNITY): Payer: Self-pay

## 2023-03-26 ENCOUNTER — Other Ambulatory Visit: Payer: Self-pay

## 2023-03-26 ENCOUNTER — Other Ambulatory Visit (HOSPITAL_COMMUNITY): Payer: Self-pay

## 2023-03-30 ENCOUNTER — Other Ambulatory Visit (HOSPITAL_COMMUNITY): Payer: Self-pay

## 2023-03-30 ENCOUNTER — Ambulatory Visit (INDEPENDENT_AMBULATORY_CARE_PROVIDER_SITE_OTHER): Payer: Medicaid Other | Admitting: Family Medicine

## 2023-03-30 ENCOUNTER — Other Ambulatory Visit: Payer: Self-pay

## 2023-03-30 ENCOUNTER — Telehealth: Payer: Self-pay

## 2023-03-30 ENCOUNTER — Encounter: Payer: Self-pay | Admitting: Family Medicine

## 2023-03-30 VITALS — BP 126/62 | HR 78 | Ht 61.0 in | Wt 293.4 lb

## 2023-03-30 VITALS — BP 129/70 | HR 78 | Ht 61.0 in | Wt 293.4 lb

## 2023-03-30 DIAGNOSIS — K219 Gastro-esophageal reflux disease without esophagitis: Secondary | ICD-10-CM | POA: Diagnosis not present

## 2023-03-30 DIAGNOSIS — K589 Irritable bowel syndrome without diarrhea: Secondary | ICD-10-CM

## 2023-03-30 DIAGNOSIS — R112 Nausea with vomiting, unspecified: Secondary | ICD-10-CM | POA: Diagnosis not present

## 2023-03-30 DIAGNOSIS — Z713 Dietary counseling and surveillance: Secondary | ICD-10-CM

## 2023-03-30 MED ORDER — PANTOPRAZOLE SODIUM 20 MG PO TBEC
20.0000 mg | DELAYED_RELEASE_TABLET | Freq: Two times a day (BID) | ORAL | 0 refills | Status: DC
  Filled 2023-03-30 – 2023-04-05 (×2): qty 60, 30d supply, fill #0

## 2023-03-30 MED ORDER — ONDANSETRON HCL 4 MG PO TABS
4.0000 mg | ORAL_TABLET | Freq: Three times a day (TID) | ORAL | 0 refills | Status: DC | PRN
  Filled 2023-03-30: qty 20, 7d supply, fill #0

## 2023-03-30 NOTE — Assessment & Plan Note (Signed)
Trulicity not covered by insurance. Patient has established with [facility] clinic. Patient looking for pill covered by her insurance for weight loss. Says she cannot do self-pay due to cost. Discussed that she should touch base with the bariatric clinic for prescriptions as she is now established there. Could consider Contrave, although she was on wellbutrin previously without effect. Also encouraged patient to follow through with the referrals placed by [facility], stressed that they would still be helpful for weight loss even without surgical intervention.

## 2023-03-30 NOTE — Progress Notes (Signed)
    SUBJECTIVE:   CHIEF COMPLAINT / HPI:   Nausea, Vomiting -started last night -3 episodes of nonbloody emesis last night -continued nausea this morning but no vomiting today -thinks maybe it's related to something she ate -normal bowel movements lately -no significant abdominal pain -yesterday had [place] hot dogs for dinner -no sick contacts, nobody else with similar symptoms after eating the hot dogs -took a dose of Zofran last night which maybe helped -has baseline GI issues- states sometimes food related, sometimes out of nowhere -chronic intermittent chest pain for months -states it feels like reflux -denies binging or purging eating behaviors -has nexplanon in place  PERTINENT  PMH / PSH: IBS, tourette's, prediabetes, migraine, bipolar disorder  OBJECTIVE:   BP 129/70   Pulse 78   Ht 5\' 1"  (1.549 m)   Wt 293 lb 6 oz (133.1 kg)   LMP 12/29/2022   SpO2 100%   BMI 55.43 kg/m   Gen: NAD, able to participate in exam CV: RRR, normal S1/S2, no murmur Resp: Normal effort, lungs CTAB GI: abdomen soft, diffuse tenderness to palpation with just lightly touching skin but no guarding or rebound, no tenderness with stethoscope, slightly diminished bowel sounds Extremities: no edema or cyanosis Skin: warm and dry, no rashes noted Neuro: alert, no obvious focal deficits Psych: flat affect   ASSESSMENT/PLAN:   Nausea and Vomiting Acute x1 day. Likely multifactorial etiology including her known GERD and IBS. Possibly superimposed gastroenteritis (viral vs foodborne) vs somatic component. No red flags on history or exam. No acute abdomen. Will trial symptomatic relief with Zofran 4mg  q8h prn and increasing PPI to twice daily. Advised she f/u with her GI specialist.   Maury Dus, MD [facility]

## 2023-03-30 NOTE — Progress Notes (Signed)
SUBJECTIVE:   CHIEF COMPLAINT / HPI:   Weight loss desired Patient presents for discussion of weight loss medication. Of note, she was seen earlier today by a colleague for acute nausea and vomiting.  She presents at this time to keep her previously scheduled appointment for this chronic issue.  Summary of attempted interventions: Ozempic - failed d/t stomach upset Saxenda - not covered by insurance Trulicity - not covered by insurance Topamax - historical, without success Wellbutrin - tried previous, but no weight loss Metformin - current, but IBS/stomach upset, would not increase more than current of 500 mg BID  Last seen by myself 2/25, at that time we ordered lab work to assess for insulin resistance.  We did find evidence of insulin resistance and prediabetes, however her insurance would not cover the Trulicity that she desired.  She was referred to [facility] bariatric clinic here in [place].   Chart indicates she saw [facility] 4/08 - declined self pay weight loss meds, citing cost - recommended increased fiber - discussed surgical options, she desires consultation - referred to bariatric surgery, nutrition, psych, bodies and motion, and sleep medicine  Says cannot do surgery right now - mom is support person and cannot support until they move - move at end of year maybe, not even in the works at this time - has not called back to do any of the other referrals like sleep and PT because she didn't think she needed them if she wasn't doing the surgery   PERTINENT  PMH / PSH:  Patient Active Problem List   Diagnosis Date Noted   Bipolar disorder 09/22/2022    Priority: High   Depression, recurrent 05/15/2022    Priority: High   Morbid obesity 07/29/2013    Priority: High   Asthma, chronic 07/14/2013    Priority: High   Nexplanon in place 03/01/2023   Weakness 01/11/2023   Depression with anxiety 01/11/2023   Muscle ache 08/14/2022   Weight loss counseling,  encounter for 06/14/2022   Gait disturbance 04/23/2022   Muscle weakness of all 4 extremities 04/13/2022   Major depression 10/07/2021   Abnormal uterine bleeding (AUB) 05/12/2021   Movement disorder 04/14/2021   Macromastia 03/16/2021   Chest pain 03/16/2021   Poor appetite 03/16/2021   Hand pain, not arthralgia 02/16/2021   Healthcare maintenance 01/29/2021   Tic disorder, unspecified 08/10/2020   Hirsutism 02/26/2020   Migraine without aura and without status migrainosus, not intractable 02/10/2020   Irritable bowel syndrome 02/10/2020   Chronic nonintractable headache 02/02/2020   Pre-diabetes 07/16/2019   Breast pain 07/16/2019   Sleeping difficulty 07/16/2019   Menorrhagia with irregular cycle 07/16/2019   Chronic low back pain without sciatica 04/26/2017   Gastroesophageal reflux  07/29/2015   Vitamin D deficiency 07/29/2013   Allergic rhinitis 07/14/2013    OBJECTIVE:   BP 126/62   Pulse 78   Ht  (1.549 m)   Wt 293 lb 6.1 oz (133.1 kg)   LMP 12/29/2022   SpO2 98%   BMI 55.43 kg/m    PHQ-9:     03/30/2023    1:19 PM 03/30/2023   10:56 AM 03/01/2023   11:01 AM  Depression screen PHQ 2/9  Decreased Interest 0 0 1  Down, Depressed, Hopeless PHQ - 2 Score Altered sleeping Tired, decreased energy 0 1 2  Change in appetite Feeling bad or failure about  yourself  0 0 0  Trouble concentrating 1 1 1   Moving slowly or fidgety/restless 0 0 1  Suicidal thoughts 0 0 0  PHQ-9 Score 7 8 10   Difficult doing work/chores Somewhat difficult Somewhat difficult     Physical Exam General: Awake, alert, oriented Cardiovascular: Regular rate and rhythm, S1 and S2 present, no murmurs auscultated Respiratory: Lung fields clear to auscultation bilaterally Neuro: Intermittent tics including throwing of head and hand  ASSESSMENT/PLAN:   Weight loss counseling, encounter for Trulicity not covered by insurance. Patient has established with [facility] clinic. Patient looking for pill covered by her insurance for weight loss. Says she cannot do self-pay due to cost. Discussed that she should touch base with the bariatric clinic for prescriptions as she is now established there. Could consider Contrave, although she was on wellbutrin previously without effect. Also encouraged patient to follow through with the referrals placed by [facility], stressed that they would still be helpful for weight loss even without surgical intervention.      Fayette Pho, MD [facility]

## 2023-03-30 NOTE — Patient Instructions (Addendum)
It was great to meet you!  I am sorry you are not feeling well.  I recommend you call your GI doctor to schedule follow-up.  For now, I have sent refills on your protonix (take twice daily) and Zofran (take every 8 hours as needed for nausea/vomiting).   Take care, Dr Anner Crete

## 2023-03-30 NOTE — Telephone Encounter (Signed)
Patient calls nurse line requesting to schedule appointment for nausea and vomiting. She reports that this has been going on since last night.   She also reports that she has been having aching feeling in the middle of her chest.   Denies shortness of breath or radiation of pain. She states that she has been having issues with chest pain for several months. She is primarily wanting to schedule appointment for nausea and vomiting.   Scheduled for this AM in [facility]. Advised of strict ED precautions.   Veronda Prude, RN

## 2023-03-30 NOTE — Assessment & Plan Note (Signed)
>>  ASSESSMENT AND PLAN FOR WEIGHT LOSS COUNSELING, ENCOUNTER FOR WRITTEN ON 03/30/2023  4:14 PM BY Fayette Pho, MD  Trulicity not covered by insurance. Patient has established with [facility] clinic. Patient looking for pill covered by her insurance for weight loss. Says she cannot do self-pay due to cost. Discussed that she should touch base with the bariatric clinic for prescriptions as she is now established there. Could consider Contrave, although she was on wellbutrin previously without effect. Also encouraged patient to follow through with the referrals placed by [facility], stressed that they would still be helpful for weight loss even without surgical intervention.

## 2023-03-30 NOTE — Patient Instructions (Signed)
It was wonderful to see you today. Thank you for allowing me to be a part of your care. Below is a short summary of what we discussed at your visit today:  Weight loss desired I spoke about your case with my attending.   A medication called Contrave may be an option for you, but I have no idea of the cost out of pocket.   Since you have already established with [facility], I would reach out to them to say you're interested in a low cost self pay weight loss medication and have them prescribe it.   Metformin If this is causing too much stomach problems, we could always stop this.  It is not absolute necessary to manage your prediabetes, as prediabetes can be commonly managed with diet and exercise.    Please bring all of your medications to every appointment!  If you have any questions or concerns, please do not hesitate to contact us via phone or MyChart message.   Fayette Pho, MD

## 2023-03-31 ENCOUNTER — Encounter: Payer: Self-pay | Admitting: Family Medicine

## 2023-04-05 ENCOUNTER — Other Ambulatory Visit (HOSPITAL_COMMUNITY): Payer: Self-pay

## 2023-04-05 ENCOUNTER — Encounter: Payer: Self-pay | Admitting: Family Medicine

## 2023-04-05 DIAGNOSIS — K2960 Other gastritis without bleeding: Secondary | ICD-10-CM

## 2023-04-06 ENCOUNTER — Other Ambulatory Visit: Payer: Self-pay

## 2023-04-06 ENCOUNTER — Other Ambulatory Visit (HOSPITAL_COMMUNITY): Payer: Self-pay

## 2023-04-06 MED ORDER — PHENTERMINE HCL 37.5 MG PO TABS
18.7500 mg | ORAL_TABLET | Freq: Two times a day (BID) | ORAL | 1 refills | Status: DC
  Filled 2023-04-06: qty 15, 15d supply, fill #0
  Filled 2023-04-24: qty 15, 15d supply, fill #1

## 2023-04-08 MED ORDER — FAMOTIDINE 20 MG PO TABS
20.0000 mg | ORAL_TABLET | Freq: Two times a day (BID) | ORAL | 1 refills | Status: DC
  Filled 2023-04-08: qty 180, 90d supply, fill #0
  Filled 2023-07-08: qty 180, 90d supply, fill #1
  Filled 2023-07-14 (×2): qty 180, 90d supply, fill #0

## 2023-04-09 ENCOUNTER — Other Ambulatory Visit: Payer: Self-pay

## 2023-04-09 ENCOUNTER — Other Ambulatory Visit (HOSPITAL_COMMUNITY): Payer: Self-pay

## 2023-04-09 ENCOUNTER — Encounter: Payer: Self-pay | Admitting: Family Medicine

## 2023-04-13 ENCOUNTER — Other Ambulatory Visit (HOSPITAL_COMMUNITY): Payer: Self-pay

## 2023-04-13 ENCOUNTER — Encounter (HOSPITAL_COMMUNITY): Payer: Self-pay | Admitting: Student in an Organized Health Care Education/Training Program

## 2023-04-13 ENCOUNTER — Telehealth (INDEPENDENT_AMBULATORY_CARE_PROVIDER_SITE_OTHER): Payer: Medicaid Other | Admitting: Student in an Organized Health Care Education/Training Program

## 2023-04-13 ENCOUNTER — Ambulatory Visit: Payer: Medicaid Other | Admitting: Family Medicine

## 2023-04-13 DIAGNOSIS — F411 Generalized anxiety disorder: Secondary | ICD-10-CM

## 2023-04-13 DIAGNOSIS — F330 Major depressive disorder, recurrent, mild: Secondary | ICD-10-CM

## 2023-04-13 MED ORDER — TRAZODONE HCL 50 MG PO TABS
50.0000 mg | ORAL_TABLET | Freq: Every day | ORAL | 3 refills | Status: DC
  Filled 2023-04-13: qty 30, 30d supply, fill #0
  Filled 2023-07-26: qty 30, 30d supply, fill #1
  Filled 2023-07-26: qty 30, 30d supply, fill #0

## 2023-04-13 MED ORDER — SERTRALINE HCL 50 MG PO TABS
50.0000 mg | ORAL_TABLET | Freq: Every day | ORAL | 3 refills | Status: DC
  Filled 2023-04-13 – 2023-05-28 (×5): qty 50, 50d supply, fill #0

## 2023-04-13 NOTE — Progress Notes (Signed)
Virtual Visit via Video Note  I connected with Sherri [name] on 04/13/23 at 10:30 AM EDT by a video enabled telemedicine application and verified that I am speaking with the correct person using two identifiers.  Location: Patient: Home Provider: Office   I discussed the limitations of evaluation and management by telemedicine and the availability of in person appointments. The patient expressed understanding and agreed to proceed.     I discussed the assessment and treatment plan with the patient. The patient was provided an opportunity to ask questions and all were answered. The patient agreed with the plan and demonstrated an understanding of the instructions.   The patient was advised to call back or seek an in-person evaluation if the symptoms worsen or if the condition fails to improve as anticipated.  I provided 25 minutes of non-face-to-face time during this encounter.   [name], MD  [facility] MD/PA/NP OP Progress Note  04/13/2023 3:26 PM Sherri [name]  MRN:  [id number]  Chief Complaint:  Chief Complaint  Patient presents with   Follow-up   HPI:  Sherri [name] is a 22 year old female seen today for follow-up psychiatric evaluation. She has a psychiatric history of bipolar disorder, SI, anxiety and depression.  Patient reports she has been compliant with the following medications   Zoloft 50 mg daily Trazodone 50 mg nightly as needed Risperdal 0.5mg  (rx 0.5mg  BID, patient taking 0.5mg  daily) Klonopin 0.5mg  QHS, rx Dr. Dina Rich at [facility]  Patient has started having galactorrhea since last Saturday, with risperdal, but her mood has been good. She reports that she has been she has been Administrator, arts projects. Patient reports that she has been sleeping well and feels well rested. Patient reports that her appetite, has decreased since she has been on phentermine, she was overeating before. She reports that before being on phentermine.She reports that she  has chronic hx of being nauseaous after eating and drinking things. Patient is not sure that this is related to anxiety.  She reports that her tics have improved since being klonopin, but she feels like it needs to be increased. She reports that she has also seen some improvement with going out in public. She reports that it is still hard to drive or be in spaces for longer periods.  Patient reports she only needs Trazodone PRN.   Patient reports that she is still in therapy, and right now they are talking about dealing with emotions. She does not recall coping skills.   Patient denies racing thoughts. Patient denies SI, HI, and AVH. Patient endorses less impulse control and has not had any episodes of stealing. She reports that her tremor has improved, but she is able to get more done.   THC- denies Etoh- denies  Patient reports that she thinks that her impulse control issues were more related to external factors, reporting that she was going through a lot at the time. Patietn reports that she has actually been taking Risperdal 0.5mg  daily.   Visit Diagnosis:    ICD-10-CM   1. Generalized anxiety disorder  F41.1 sertraline (ZOLOFT) 50 MG tablet    traZODone (DESYREL) 50 MG tablet    2. MDD (major depressive disorder), recurrent episode, mild (HCC)  F33.0       Past Psychiatric History: Bipolar disorder ? (Unsure of who dx her with this or when), SI, anxiety and depression    11/2022-started patient on risperidone 0.5 mg twice daily due to patient endorsing stealing things  and feeling more impulsive without being on risperidone.  Past Medical History:  Past Medical History:  Diagnosis Date    Chronic pain 02/02/2020   Abnormal vision screen 08/26/2015   Acanthosis nigricans 02/26/2020   Acid reflux    Allergy    Seasonal allergies   Anxiety    Phreesia 11/24/2020   Asthma    Atopic dermatitis 07/14/2013   Carpal tunnel syndrome, bilateral 11/21/2018   S/p bilat surgery, release    Chest pain 07/16/2019   Concussion 02/16/2021   Approx 2/15 d/t hit posterior skull against wall during tic.    Constipation 06/17/2013   Cough 01/29/2021   COVID-19 01/22/2020   Eczema    Elevated blood pressure reading 02/26/2020   Gallstones    GERD (gastroesophageal reflux disease)    Phreesia 11/24/2020   Headache    Obesity    Pain of both hip joints 11/10/2019   PCOS (polycystic ovarian syndrome) 02/26/2020   Recurrent viral infection 05/15/2022   Seasonal allergies    Tic    Toothache 11/06/2022    Past Surgical History:  Procedure Laterality Date   BILATERAL CARPAL TUNNEL RELEASE     CHOLECYSTECTOMY N/A 08/08/2017   Procedure: LAPAROSCOPIC CHOLECYSTECTOMY;  Surgeon: Kandice Hams, MD;  Location: [facility] OR;  Service: General;  Laterality: N/A;   UPPER GASTROINTESTINAL ENDOSCOPY      Family Psychiatric History: Mother depression, brother depression, Sister depression, 2 brother learning disability, a maternal aunt depression   Family History:  Family History  Problem Relation Age of Onset   Asthma Mother    Arthritis Mother    Depression Mother    Diabetes Mother    Hypertension Mother    Obesity Mother    Irritable bowel syndrome Mother    Asthma Sister    Learning disabilities Sister    Stroke Sister    Asthma Brother    Depression Brother    Learning disabilities Brother    Irritable bowel syndrome Brother    Depression Maternal Aunt    Diabetes Maternal Aunt    Mental illness Maternal Aunt    Diabetes Maternal Uncle    Kidney disease Maternal Uncle    Arthritis Maternal Grandmother    Diabetes Maternal Grandmother    Heart disease Maternal Grandmother    Kidney disease Maternal Grandmother    Hypertension Maternal Grandmother    Hyperlipidemia Maternal Grandmother    Cervical cancer Maternal Grandmother    Stroke Maternal Grandfather    Cancer Other    COPD Other    Heart disease Other    Hypertension Other    Obesity Father    Heart attack  Father    Heart disease Paternal Grandmother    Hypertension Paternal Grandmother    Colon cancer Neg Hx    Esophageal cancer Neg Hx    Pancreatic cancer Neg Hx    Stomach cancer Neg Hx     Social History:  Social History   Socioeconomic History   Marital status: Single    Spouse name: Not on file   Number of children: 0   Years of education: 12   Highest education level: GED or equivalent  Occupational History   Occupation: Furniture conservator/restorer to college   Occupation: Seeking disability  Tobacco Use   Smoking status: Never    Passive exposure: Yes   Smokeless tobacco: Never   Tobacco comments:    mom stopped smoking  Vaping Use   Vaping Use: Never used  Substance and Sexual  Activity   Alcohol use: No   Drug use: No   Sexual activity: Never  Other Topics Concern   Not on file  Social History Narrative   Lives with Mom and some siblings, some of whom have children of their own, dropped out of [employer]. plans to start [employer] for GED.   Right-handed.   No daily use of caffeine.    Social Determinants of Health   Financial Resource Strain: Not on file  Food Insecurity: No Food Insecurity (06/08/2020)   Hunger Vital Sign    Worried About Running Out of Food in the Last Year: Never true    Ran Out of Food in the Last Year: Never true  Transportation Needs: Unmet Transportation Needs (06/08/2020)   PRAPARE - Administrator, Civil Service (Medical): Yes    Lack of Transportation (Non-Medical): Yes  Physical Activity: Not on file  Stress: Not on file  Social Connections: Not on file    Allergies:  Allergies  Allergen Reactions   Ibuprofen Other (See Comments)    Chest pain   Shellfish Allergy Hives and Itching    Mouth itches, facial hives.     Metabolic Disorder Labs: Lab Results  Component Value Date   HGBA1C 5.9 (H) 03/01/2023   MPG 123 02/11/2020   MPG 126 07/18/2019   Lab Results  Component Value Date   PROLACTIN 12.2 07/18/2019   Lab Results   Component Value Date   CHOL 176 03/01/2023   TRIG 50 03/01/2023   HDL 73 03/01/2023   CHOLHDL 2.4 03/01/2023   VLDL 16 07/25/2013   LDLCALC 93 03/01/2023   LDLCALC 79 01/27/2021   Lab Results  Component Value Date   TSH 0.820 01/11/2023   TSH 1.02 03/23/2022    Therapeutic Level Labs: No results found for: "LITHIUM" No results found for: "VALPROATE" No results found for: "CBMZ"  Current Medications: Current Outpatient Medications  Medication Sig Dispense Refill   acetaminophen (TYLENOL) 500 MG tablet Take 1 tablet (500 mg total) by mouth every 4-6 hours as needed. 28 tablet 0   albuterol (PROVENTIL) (2.5 MG/3ML) 0.083% nebulizer solution Take 3 mLs (2.5 mg total) by nebulization every 6 (six) hours as needed for wheezing or shortness of breath. 75 mL 12   albuterol (VENTOLIN HFA) 108 (90 Base) MCG/ACT inhaler Inhale 2 puffs into the lungs every 6 (six) hours as needed for wheezing or shortness of breath. 18 g 4   budesonide-formoterol (SYMBICORT) 160-4.5 MCG/ACT inhaler Inhale 2 puffs into the lungs 2 (two) times daily. 10.2 g 3   cholecalciferol (VITAMIN D3) 25 MCG (1000 UNIT) tablet Take 1 tablet (1,000 Units total) by mouth daily. Return for vit D recheck when this bottle runs out. 90 tablet 0   clonazePAM (KLONOPIN) 0.5 MG tablet Take 1 tablet (0.5 mg total) by mouth at bedtime. 30 tablet 3   clonazePAM (KLONOPIN) 0.5 MG tablet Take 1 tablet (0.5 mg total) by mouth Nightly. 30 tablet 3   dicyclomine (BENTYL) 10 MG capsule Take 1 capsule (10 mg total) by mouth 2 (two) times daily as needed for spasms. 60 capsule 0   etonogestrel (NEXPLANON) 68 MG IMPL implant 1 each (68 mg total) by Subdermal route once. 1 each 0   famotidine (PEPCID) 20 MG tablet Take 1 tablet (20 mg total) by mouth 2 (two) times daily. 180 tablet 1   ibuprofen (ADVIL) 800 MG tablet Take 1 tablet (800 mg total) by mouth every 6 (six) hours as  needed for pain. 21 tablet 0   linaclotide (LINZESS) 72 MCG capsule  Take 1 capsule (72 mcg total) by mouth daily before breakfast. 90 capsule 3   melatonin 3 MG TABS tablet Take 1 tablet (3 mg total) by mouth at bedtime. 30 tablet 3   metFORMIN (GLUCOPHAGE) 500 MG tablet Take 1 tablet (500 mg total) by mouth 2 (two) times daily with a meal. 180 tablet 3   montelukast (SINGULAIR) 10 MG tablet Take 1 tablet (10 mg total) by mouth at bedtime. 90 tablet 3   ondansetron (ZOFRAN) 4 MG tablet Take 1 tablet (4 mg total) by mouth every 8 (eight) hours as needed for nausea or vomiting. 20 tablet 0   pantoprazole (PROTONIX) 20 MG tablet Take 1 tablet (20 mg total) by mouth 2 (two) times daily. 60 tablet 0   phentermine (ADIPEX-P) 37.5 MG tablet Take 1/2 tablet (18.75 mg total) by mouth 2 (two) times daily 30 minutes before breakfast and lunch 15 tablet 1   sertraline (ZOLOFT) 50 MG tablet Take 1 tablet (50 mg total) by mouth daily. 50 tablet 3   Spacer/Aero Chamber Mouthpiece MISC 1 Device by Does not apply route as needed. 1 each 0   SUMAtriptan (IMITREX) 25 MG tablet Take 1 tablet (25 mg total) by mouth every 2 (two) hours as needed for migraine. May repeat in 2 hours if headache persists or recurs. Maximum 200MG  in 24 hours 10 tablet 0   traZODone (DESYREL) 50 MG tablet Take 1 tablet (50 mg total) by mouth at bedtime. 30 tablet 3   triamcinolone cream in Minerin Creme Apply 1 application topically 2 (two) times daily. Do not use for more than 7 consecutive days 454 g 0   No current facility-administered medications for this visit.       Psychiatric Specialty Exam: Review of Systems  Psychiatric/Behavioral:  Negative for dysphoric mood, hallucinations, sleep disturbance and suicidal ideas. The patient is not nervous/anxious.     There were no vitals taken for this visit.There is no height or weight on file to calculate BMI.  General Appearance: Casual  Eye Contact:  Fair  Speech:  Clear and Coherent  Volume:  Normal  Mood:  Euthymic  Affect:  Appropriate   Thought Process:  Coherent  Orientation:  Full (Time, Place, and Person)  Thought Content: Logical   Suicidal Thoughts:  No  Homicidal Thoughts:  No  Memory:  Immediate;   Good Recent;   Good  Judgement:  Fair  Insight:  Shallow  Psychomotor Activity:  Normal  Concentration:  Concentration: Fair  Recall:  NA  Fund of Knowledge: Good  Language: Good  Akathisia:  NA  Handed:    AIMS (if indicated): not done  Assets:  Communication Skills Desire for Improvement Housing Leisure Time Resilience  ADL's:  Intact  Cognition: WNL  Sleep:  Good   Screenings: GAD-7    Flowsheet Row Video Visit from 09/06/2022 in [facility] Clinical Support from 01/30/2022 in [facility] Office Visit from 11/03/2021 in [facility] Office Visit from 06/08/2020 in [facility] Video Visit from 02/10/2020 in [facility]  Total GAD-7 Score 13 19 18 12 21       PHQ2-9    Flowsheet Row Office Visit from 03/30/2023 in [facility] Most recent reading at 03/30/2023  1:19 PM  Office Visit from 03/30/2023 in [facility] Most recent reading at 03/30/2023 10:56 AM Office Visit from 03/01/2023 in [facility] Center Most recent reading at 03/01/2023 11:01 AM Office Visit from 01/23/2023 in [facility] Most recent reading at 01/23/2023  9:46 AM Office Visit from 11/22/2022 in [facility] Center Most recent reading at 11/22/2022  9:26 AM  PHQ-2 Total Score 1 1 2 4 2   PHQ-9 Total Score 7 8 10 14 14       Flowsheet Row ED from 02/04/2023 in [facility] at [facility] Video Visit from 06/05/2022 in [facility] ED from 05/01/2022 in [facility] RISK  CATEGORY No Risk Error: Q7 should not be populated when Q6 is No No Risk        Assessment and Plan:  Based on assessment today patient endorses that psychiatrically she is feeling more stable and improved since last visit.  Patient is endorsing that a lot of her adverse behaviors such as stealing may have been secondary to external events going on her life and more poor coping mechanisms.  Patient also continues to endorse symptoms of social anxiety and generalized anxiety disorder as well as somatization of her anxiety.  At this time do think a lot of patient's presentation is related to personality and may benefit more from therapy.  However we will continue Zoloft.  Patient herself does feel like she may be able to do well without her risperidone and has been titrating herself down.  As previously documented patient has been on Zoloft unopposed in the past without inducing a manic episode again both of these points suggest that patient's bipolar diagnoses may be incorrect however, we continue to monitor patient.  Patient does exhibit some avoidant personality traits such as constantly taking her video visits inside of her indoor tent where she tends to seclude herself, spending most of her time doing art and this indoor tent, and struggling with being around others especially in the real world setting.  Patient also endorses essentially having no coping skills despite being in therapy.   GAD Historical concern for Bipolar disorder versus MDD W/psychotic features  Tourette syndrome  Traits for avoidant personality disorder -Dc risperdal -continue zoloft 50mg  -Continue trazodone 50 mg nightly as needed  Collaboration of Care: Collaboration of Care:   Patient/Guardian was advised Release of Information must be obtained prior to any record release in order to collaborate their care with an outside provider. Patient/Guardian was advised if they have not already done so to contact the registration  department to sign all necessary forms in order for [place] to release information regarding their care.   Consent: Patient/Guardian gives verbal consent for treatment and assignment of benefits for services provided during this visit. Patient/Guardian expressed understanding and agreed to proceed.   PGY-3 [name], MD 04/13/2023, 3:26 PM

## 2023-04-24 ENCOUNTER — Other Ambulatory Visit: Payer: Self-pay

## 2023-04-24 ENCOUNTER — Other Ambulatory Visit: Payer: Self-pay | Admitting: Family Medicine

## 2023-04-24 ENCOUNTER — Other Ambulatory Visit (HOSPITAL_COMMUNITY): Payer: Self-pay

## 2023-04-24 DIAGNOSIS — L309 Dermatitis, unspecified: Secondary | ICD-10-CM

## 2023-04-24 MED ORDER — TRIAMCINOLONE ACETONIDE 0.1 % EX CREA
1.0000 "application " | TOPICAL_CREAM | Freq: Two times a day (BID) | CUTANEOUS | 1 refills | Status: DC
  Filled 2023-04-24 – 2023-05-16 (×3): qty 454, 30d supply, fill #0
  Filled 2023-07-26 – 2023-08-08 (×4): qty 454, 30d supply, fill #1

## 2023-04-25 ENCOUNTER — Other Ambulatory Visit (HOSPITAL_COMMUNITY): Payer: Self-pay

## 2023-04-26 ENCOUNTER — Other Ambulatory Visit (HOSPITAL_COMMUNITY): Payer: Self-pay

## 2023-05-01 ENCOUNTER — Other Ambulatory Visit (HOSPITAL_COMMUNITY): Payer: Self-pay

## 2023-05-08 ENCOUNTER — Other Ambulatory Visit (HOSPITAL_COMMUNITY): Payer: Self-pay

## 2023-05-10 ENCOUNTER — Other Ambulatory Visit (HOSPITAL_COMMUNITY): Payer: Self-pay

## 2023-05-11 ENCOUNTER — Other Ambulatory Visit (HOSPITAL_COMMUNITY): Payer: Self-pay

## 2023-05-11 ENCOUNTER — Other Ambulatory Visit: Payer: Self-pay

## 2023-05-11 ENCOUNTER — Other Ambulatory Visit: Payer: Self-pay | Admitting: Family Medicine

## 2023-05-11 MED ORDER — PHENTERMINE HCL 37.5 MG PO TABS
18.7500 mg | ORAL_TABLET | Freq: Two times a day (BID) | ORAL | 0 refills | Status: DC
  Filled 2023-05-11: qty 30, 30d supply, fill #0
  Filled 2023-05-16: qty 15, 15d supply, fill #0
  Filled 2023-05-28 – 2023-05-29 (×3): qty 15, 15d supply, fill #1

## 2023-05-11 MED ORDER — PANTOPRAZOLE SODIUM 20 MG PO TBEC
20.0000 mg | DELAYED_RELEASE_TABLET | Freq: Two times a day (BID) | ORAL | 2 refills | Status: DC
  Filled 2023-05-11 – 2023-05-17 (×4): qty 60, 30d supply, fill #0
  Filled 2023-06-11: qty 60, 30d supply, fill #1
  Filled 2023-07-08: qty 60, 30d supply, fill #2
  Filled 2023-07-14: qty 30, 15d supply, fill #0
  Filled 2023-07-26 (×2): qty 30, 15d supply, fill #1

## 2023-05-15 ENCOUNTER — Other Ambulatory Visit (HOSPITAL_COMMUNITY): Payer: Self-pay

## 2023-05-16 ENCOUNTER — Other Ambulatory Visit: Payer: Self-pay | Admitting: Family Medicine

## 2023-05-16 ENCOUNTER — Other Ambulatory Visit (HOSPITAL_COMMUNITY): Payer: Self-pay

## 2023-05-17 ENCOUNTER — Other Ambulatory Visit (HOSPITAL_COMMUNITY): Payer: Self-pay

## 2023-05-17 ENCOUNTER — Other Ambulatory Visit: Payer: Self-pay

## 2023-05-25 ENCOUNTER — Ambulatory Visit (INDEPENDENT_AMBULATORY_CARE_PROVIDER_SITE_OTHER): Payer: Medicaid Other | Admitting: Family Medicine

## 2023-05-25 ENCOUNTER — Encounter: Payer: Self-pay | Admitting: Family Medicine

## 2023-05-25 ENCOUNTER — Ambulatory Visit (HOSPITAL_COMMUNITY)
Admission: RE | Admit: 2023-05-25 | Discharge: 2023-05-25 | Disposition: A | Discharge status: Home / Self Care | Payer: Medicaid Other | Source: Ambulatory Visit | Attending: Family Medicine | Admitting: Family Medicine

## 2023-05-25 VITALS — BP 110/64 | HR 112 | Ht 61.0 in | Wt 268.0 lb

## 2023-05-25 DIAGNOSIS — R079 Chest pain, unspecified: Secondary | ICD-10-CM | POA: Insufficient documentation

## 2023-05-25 DIAGNOSIS — R443 Hallucinations, unspecified: Secondary | ICD-10-CM

## 2023-05-25 DIAGNOSIS — E559 Vitamin D deficiency, unspecified: Secondary | ICD-10-CM

## 2023-05-25 DIAGNOSIS — N6452 Nipple discharge: Secondary | ICD-10-CM

## 2023-05-25 DIAGNOSIS — R519 Headache, unspecified: Secondary | ICD-10-CM

## 2023-05-25 DIAGNOSIS — G4700 Insomnia, unspecified: Secondary | ICD-10-CM

## 2023-05-25 DIAGNOSIS — K589 Irritable bowel syndrome without diarrhea: Secondary | ICD-10-CM

## 2023-05-25 LAB — POCT URINE PREGNANCY: Preg Test, Ur: NEGATIVE

## 2023-05-25 NOTE — Progress Notes (Unsigned)
SUBJECTIVE:   CHIEF COMPLAINT / HPI:   Sherri [name] is a pleasant 22 yo presenting today with multiple complaints. Scheduled for headache, dizziness, and fatigue but also reports intermittent chest pain, auditory and visual hallucinations, stomach pains, and poor sleep.   Reported nipple discharge 4/22 via Mychart message. I instructed her to check in with her psychiatrist and make an appointment with [place] as soon as possible. She was able to see her psychiatrist via video appointment 4/26. Patient reports being taken off Risperdal but continuing on Zoloft 50 mg and trazodone 50 mg QHS.   Today she reports improvement in nipple discharge once she was of Risperdal, however there is still discharge if she pressed the nipple.   Of note, patient was started on phentermine by bariatrics clinic sometime AFTER she stopped taking the Risperdal. Patient does not believe it is the phentermine because she was having all these issues before starting it.   Issues since stopping the Risperdal 4/22: Distressing auditory and visual hallucinations (however no SI/HI, feels safe at home and with herself) Headaches  Issues present prior to stopping Risperdal: Chest pain, palpitations "Feels like someone is sitting on my chest" Chest pain makes whole body hurt - stomach, legs, arms, sides With the slightest movement, feels like her heart starts to race Does have family history of heart disease Father died from MI Reports being seen several times in past by cardiology with unremarkable work up Chart review only shows peds cardiology did ECHO in 2017 Could have other visits in outside systems Stomach pain Reports does not feel like spasm but rather "nausea and spasm at the same time" Has tried the zofran 4 mg and 8 mg without relief Bentyl 10 mg doesn't help either Only thing that helps is heating pad Can wake up every morning with a headache and stomach ache Insomnia, sleep issues Reports cannot fall  asleep until 4 am Does get 6-8 hours of sleep, wakes up 11 am or 12 pm Trouble sleeping because she's scared, more recently due to visual and auditory hallucinations Issues upon standing Reports that as soon as she stands up, her head hurts, she has difficulty hearing and seeing Issues upon waking Hands feel numb and tingly; they "won't stop shaking" Sore, dry throat Headache  Issues with exertion and heat exposure Reports with "walking around for too long" or being in the heat, she feels as though she will pass out Feels weak in the legs and dizzy headache  PERTINENT  PMH / PSH:  Patient Active Problem List   Diagnosis Date Noted   Bipolar disorder (HCC) 09/22/2022    Priority: High   Depression, recurrent (HCC) 05/15/2022    Priority: High   Morbid obesity (HCC) 07/29/2013    Priority: High   Asthma, chronic 07/14/2013    Priority: High   Insomnia 05/26/2023   Nipple discharge in female 05/26/2023   Hallucinations 05/26/2023   MDD (major depressive disorder), recurrent episode, mild (HCC) 04/13/2023   Generalized anxiety disorder 04/13/2023   Nexplanon in place 03/01/2023   Weakness 01/11/2023   Depression with anxiety 01/11/2023   Muscle ache 08/14/2022   Weight loss counseling, encounter for 06/14/2022   Gait disturbance 04/23/2022   Muscle weakness of all 4 extremities 04/13/2022   Major depression 10/07/2021   Abnormal uterine bleeding (AUB) 05/12/2021   Movement disorder 04/14/2021   Macromastia 03/16/2021   Chest pain 03/16/2021   Poor appetite 03/16/2021   Hand pain, not arthralgia 02/16/2021   Healthcare  maintenance 01/29/2021   Tic disorder, unspecified 08/10/2020   Hirsutism 02/26/2020   Migraine without aura and without status migrainosus, not intractable 02/10/2020   Irritable bowel syndrome 02/10/2020   Chronic nonintractable headache 02/02/2020   Pre-diabetes 07/16/2019   Breast pain 07/16/2019   Sleeping difficulty 07/16/2019   Menorrhagia with  irregular cycle 07/16/2019   Chronic low back pain without sciatica 04/26/2017   Gastroesophageal reflux  07/29/2015   Vitamin D deficiency 07/29/2013   Allergic rhinitis 07/14/2013    OBJECTIVE:   BP 110/64   Pulse (!) 112   Ht 5\' 1"  (1.549 m)   Wt 268 lb (121.6 kg)   LMP 05/23/2023   BMI 50.64 kg/m    PHQ-9:     05/25/2023    1:50 PM 03/30/2023    1:19 PM 03/30/2023   10:56 AM  Depression screen PHQ 2/9  Decreased Interest 2 0 0  Down, Depressed, Hopeless 3 1 1   PHQ - 2 Score 5 1 1   Altered sleeping 3 2 2   Tired, decreased energy 3 0 1  Change in appetite 2 3 3   Feeling bad or failure about yourself  0 0 0  Trouble concentrating 1 1 1   Moving slowly or fidgety/restless 3 0 0  Suicidal thoughts 2 0 0  PHQ-9 Score 19 7 8   Difficult doing work/chores Very difficult Somewhat difficult Somewhat difficult    Physical Exam General: Awake, alert, oriented HEENT: Sclera anicteric and noninjected, EOM intact, nares unremarkable, moist oral mucosa without lesion Cardiovascular: Regular rate and rhythm, S1 and S2 present, no murmurs auscultated, brisk cap refill, normal skin turgor Respiratory: Lung fields clear to auscultation bilaterally Neuro: Cranial nerves II through X grossly intact, able to move all extremities spontaneously, tremulous hands Psych: Patient bouncing legs throughout entirety of appointment  ASSESSMENT/PLAN:   Nipple discharge in female Given that nipple discharge has improved but is not totally subsided after cessation of Risperdal, will obtain lab work to help ascertain any other underlying etiologies.  Reassuringly, brain MRI back in January 2024 was unremarkable.  Will obtain BMP, CBC, urine pregnancy, prolactin, TSH.   Irritable bowel syndrome Stomach pains without pattern, although can wake patient from sleep.  Patient last saw her GI doctor in April 2023 (about a year ago).  Recommend patient return to GI.  She reports she is no longer taking her Pepcid  or Protonix because she was told by another doctor to stop.  Not taking Zofran or Bentyl because she finds it unhelpful.  Insomnia Suspect fatigue, headache, and dry sore throat upon waking due to snoring or sleep apnea.  Will order home sleep study to evaluate.  Chest pain Patient tachycardic today but also very anxious. Do not believe PE at this time given long history of intermittent chest pain, previously worked up be peds cardiology. EKG and physical exam reassuring in clinic. Will refer to cardiology.   Hallucinations Patient reports difficulty reaching her psychiatry office by phone to schedule an appointment despite multiple attempts at calling.  She does endorse auditory and visual hallucinations now that she has stopped her Risperdal, some of these she finds distressing.  She reports no SI or HI at this time.  Reports that she feels safe at home and with herself despite these hallucinations.  Discussed contacting psychiatrist and also reminded about [facility] behavioral health urgent care, but she is aware.  She agrees to go to [facility] over the weekend if needed before being able to reach her psychiatrist.  Sent note to her psychiatrist via epic, who responded that she would forward to scheduled for an appointment.  Psychiatrist also relayed walk-in hours Monday through Friday she gets there at 7 AM.  I have relayed this information to the patient via MyChart message.     Sherri Pho, MD [facility]

## 2023-05-25 NOTE — Patient Instructions (Addendum)
It was wonderful to see you today. Thank you for allowing me to be a part of your care. Below is a short summary of what we discussed at your visit today:  Nipple discharge  I am glad your psychiatrist stopped the Risperdal.  This is a medication that can commonly cause nipple discharge.  Because it is still happening a little bit when you press down, I will get some blood work and a urine sample today.  If the results are normal, I will send you a letter or MyChart message. If the results are abnormal, I will give you a call.    I will also send a message to your psychiatrist to see if they can get in contact with you.  I am sorry you are having difficulty getting into contact with them.  Headache, poor sleep I suspect the headache and poor sleep are related.  I do want to make sure you do not have sleep apnea.  I have ordered a home sleep study through the [facility] pulmonology.  They should get in contact with you regarding your home sleep study kit.  Please follow the directions and send it back in.  Chest pain The EKG today was normal. This is reassuring against a heart attack! We are getting some blood work today.  This may be related to the medication changes, addition of phentermine, anemia, or a number of other things.  I have referred you to cardiology for this chest pain.  Someone from their office should be calling you in 1 to 2 weeks to schedule an appointment.  If you do not hear from them, let us know. We may need to nudge along the referral.    Stomach pain Please call and get back in with your GI doctor. You last saw Quentin Mulling PA-C at [facility] one year ago. Please call directly for an appointment.  [facility] Located in: [place] [facility] [address] Address: [address], [place], [place] [address] Phone: [phone]   Please bring all of your medications to every appointment! If you have any questions or concerns,  please do not hesitate to contact us via phone or MyChart message.   Fayette Pho, MD

## 2023-05-26 ENCOUNTER — Encounter: Payer: Self-pay | Admitting: Family Medicine

## 2023-05-26 DIAGNOSIS — R443 Hallucinations, unspecified: Secondary | ICD-10-CM | POA: Insufficient documentation

## 2023-05-26 DIAGNOSIS — G4700 Insomnia, unspecified: Secondary | ICD-10-CM | POA: Insufficient documentation

## 2023-05-26 DIAGNOSIS — N6452 Nipple discharge: Secondary | ICD-10-CM | POA: Insufficient documentation

## 2023-05-26 LAB — BASIC METABOLIC PANEL
BUN/Creatinine Ratio: 9 (ref 9–23)
BUN: 7 mg/dL (ref 6–20)
CO2: 21 mmol/L (ref 20–29)
Calcium: 9.6 mg/dL (ref 8.7–10.2)
Chloride: 103 mmol/L (ref 96–106)
Creatinine, Ser: 0.81 mg/dL (ref 0.57–1.00)
Glucose: 93 mg/dL (ref 70–99)
Potassium: 4.3 mmol/L (ref 3.5–5.2)
Sodium: 139 mmol/L (ref 134–144)
eGFR: 107 mL/min/{1.73_m2} (ref >59)

## 2023-05-26 LAB — TSH RFX ON ABNORMAL TO FREE T4: TSH: 0.45 u[IU]/mL (ref 0.450–4.500)

## 2023-05-26 LAB — VITAMIN D 25 HYDROXY (VIT D DEFICIENCY, FRACTURES): Vit D, 25-Hydroxy: 35.5 ng/mL (ref 30.0–100.0)

## 2023-05-26 LAB — CBC
Hematocrit: 40.4 % (ref 34.0–46.6)
Hemoglobin: 12.9 g/dL (ref 11.1–15.9)
MCH: 26.4 pg — ABNORMAL LOW (ref 26.6–33.0)
MCHC: 31.9 g/dL (ref 31.5–35.7)
MCV: 83 fL (ref 79–97)
Platelets: 354 10*3/uL (ref 150–450)
RBC: 4.88 x10E6/uL (ref 3.77–5.28)
RDW: 15.5 % — ABNORMAL HIGH (ref 11.7–15.4)
WBC: 5.9 10*3/uL (ref 3.4–10.8)

## 2023-05-26 LAB — PROLACTIN: Prolactin: 7 ng/mL (ref 4.8–33.4)

## 2023-05-26 NOTE — Assessment & Plan Note (Signed)
Given that nipple discharge has improved but is not totally subsided after cessation of Risperdal, will obtain lab work to help ascertain any other underlying etiologies.  Reassuringly, brain MRI back in January 2024 was unremarkable.  Will obtain BMP, CBC, urine pregnancy, prolactin, TSH.

## 2023-05-26 NOTE — Assessment & Plan Note (Signed)
Stomach pains without pattern, although can wake patient from sleep.  Patient last saw her GI doctor in April 2023 (about a year ago).  Recommend patient return to GI.  She reports she is no longer taking her Pepcid or Protonix because she was told by another doctor to stop.  Not taking Zofran or Bentyl because she finds it unhelpful.

## 2023-05-26 NOTE — Assessment & Plan Note (Addendum)
Patient reports difficulty reaching her psychiatry office by phone to schedule an appointment despite multiple attempts at calling.  She does endorse auditory and visual hallucinations now that she has stopped her Risperdal, some of these she finds distressing.  She reports no SI or HI at this time.  Reports that she feels safe at home and with herself despite these hallucinations.  Discussed contacting psychiatrist and also reminded about [facility] behavioral health urgent care, but she is aware.  She agrees to go to [facility] over the weekend if needed before being able to reach her psychiatrist.  Rhina Brackett note to her psychiatrist via epic, who responded that she would forward to scheduled for an appointment.  Psychiatrist also relayed walk-in hours Monday through Friday she gets there at 7 AM.  I have relayed this information to the patient via MyChart message.

## 2023-05-26 NOTE — Assessment & Plan Note (Signed)
Patient tachycardic today but also very anxious. Do not believe PE at this time given long history of intermittent chest pain, previously worked up be peds cardiology. EKG and physical exam reassuring in clinic. Will refer to cardiology.

## 2023-05-26 NOTE — Assessment & Plan Note (Signed)
Suspect fatigue, headache, and dry sore throat upon waking due to snoring or sleep apnea.  Will order home sleep study to evaluate.

## 2023-05-28 ENCOUNTER — Other Ambulatory Visit (HOSPITAL_COMMUNITY): Payer: Self-pay

## 2023-05-29 ENCOUNTER — Other Ambulatory Visit (HOSPITAL_COMMUNITY): Payer: Self-pay

## 2023-05-29 ENCOUNTER — Other Ambulatory Visit: Payer: Self-pay

## 2023-05-30 ENCOUNTER — Other Ambulatory Visit: Payer: Self-pay

## 2023-05-30 ENCOUNTER — Other Ambulatory Visit (HOSPITAL_COMMUNITY): Payer: Self-pay

## 2023-06-11 ENCOUNTER — Other Ambulatory Visit: Payer: Self-pay

## 2023-06-11 ENCOUNTER — Other Ambulatory Visit (HOSPITAL_COMMUNITY): Payer: Self-pay

## 2023-06-11 MED ORDER — CLONAZEPAM 0.5 MG PO TABS
0.5000 mg | ORAL_TABLET | Freq: Every day | ORAL | 3 refills | Status: DC
  Filled 2023-06-12 – 2023-06-14 (×3): qty 30, 30d supply, fill #0
  Filled 2023-07-08 – 2023-07-19 (×2): qty 30, 30d supply, fill #1
  Filled 2023-07-23: qty 30, 30d supply, fill #0
  Filled 2023-07-23 – 2023-09-20 (×2): qty 30, 30d supply, fill #1
  Filled 2023-10-15 – 2023-10-18 (×2): qty 30, 30d supply, fill #2

## 2023-06-12 ENCOUNTER — Other Ambulatory Visit (HOSPITAL_COMMUNITY): Payer: Self-pay

## 2023-06-13 ENCOUNTER — Other Ambulatory Visit: Payer: Self-pay

## 2023-06-13 ENCOUNTER — Other Ambulatory Visit (HOSPITAL_COMMUNITY): Payer: Self-pay

## 2023-06-13 ENCOUNTER — Telehealth (INDEPENDENT_AMBULATORY_CARE_PROVIDER_SITE_OTHER): Payer: Medicaid Other | Admitting: Student in an Organized Health Care Education/Training Program

## 2023-06-13 ENCOUNTER — Encounter (HOSPITAL_COMMUNITY): Payer: Self-pay | Admitting: Student in an Organized Health Care Education/Training Program

## 2023-06-13 DIAGNOSIS — F4310 Post-traumatic stress disorder, unspecified: Secondary | ICD-10-CM

## 2023-06-13 DIAGNOSIS — F411 Generalized anxiety disorder: Secondary | ICD-10-CM | POA: Diagnosis not present

## 2023-06-13 DIAGNOSIS — F333 Major depressive disorder, recurrent, severe with psychotic symptoms: Secondary | ICD-10-CM | POA: Diagnosis not present

## 2023-06-13 MED ORDER — SERTRALINE HCL 100 MG PO TABS
100.0000 mg | ORAL_TABLET | Freq: Every day | ORAL | 1 refills | Status: DC
Start: 2023-06-27 — End: 2023-07-27
  Filled 2023-06-13: qty 30, 30d supply, fill #0
  Filled 2023-07-08: qty 30, 30d supply, fill #1
  Filled 2023-07-14: qty 30, 30d supply, fill #0

## 2023-06-13 MED ORDER — ARIPIPRAZOLE 2 MG PO TABS
2.0000 mg | ORAL_TABLET | Freq: Every day | ORAL | 2 refills | Status: DC
Start: 2023-06-13 — End: 2023-07-27
  Filled 2023-06-13: qty 30, 30d supply, fill #0
  Filled 2023-07-08: qty 30, 30d supply, fill #1
  Filled 2023-07-14: qty 30, 30d supply, fill #0
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
## 2023-06-14 ENCOUNTER — Other Ambulatory Visit: Payer: Self-pay

## 2023-06-14 ENCOUNTER — Other Ambulatory Visit (HOSPITAL_COMMUNITY): Payer: Self-pay

## 2023-06-18 ENCOUNTER — Other Ambulatory Visit (HOSPITAL_COMMUNITY): Payer: Self-pay

## 2023-07-01 ENCOUNTER — Other Ambulatory Visit (HOSPITAL_COMMUNITY): Payer: Self-pay

## 2023-07-08 ENCOUNTER — Other Ambulatory Visit: Payer: Self-pay

## 2023-07-09 ENCOUNTER — Encounter (HOSPITAL_COMMUNITY): Payer: Self-pay | Admitting: Pharmacist

## 2023-07-09 ENCOUNTER — Other Ambulatory Visit (HOSPITAL_COMMUNITY): Payer: Self-pay

## 2023-07-10 ENCOUNTER — Other Ambulatory Visit (HOSPITAL_COMMUNITY): Payer: Self-pay

## 2023-07-10 ENCOUNTER — Other Ambulatory Visit: Payer: Self-pay

## 2023-07-11 ENCOUNTER — Other Ambulatory Visit (HOSPITAL_COMMUNITY): Payer: Self-pay

## 2023-07-12 ENCOUNTER — Ambulatory Visit: Payer: MEDICAID | Admitting: Internal Medicine

## 2023-07-14 ENCOUNTER — Other Ambulatory Visit (HOSPITAL_COMMUNITY): Payer: Self-pay

## 2023-07-16 ENCOUNTER — Other Ambulatory Visit (HOSPITAL_COMMUNITY): Payer: Self-pay

## 2023-07-19 ENCOUNTER — Other Ambulatory Visit: Payer: Self-pay

## 2023-07-19 ENCOUNTER — Other Ambulatory Visit (HOSPITAL_COMMUNITY): Payer: Self-pay

## 2023-07-20 ENCOUNTER — Other Ambulatory Visit (HOSPITAL_COMMUNITY): Payer: Self-pay

## 2023-07-23 ENCOUNTER — Other Ambulatory Visit: Payer: Self-pay | Admitting: Family Medicine

## 2023-07-23 ENCOUNTER — Other Ambulatory Visit (HOSPITAL_COMMUNITY): Payer: Self-pay

## 2023-07-23 DIAGNOSIS — J4530 Mild persistent asthma, uncomplicated: Secondary | ICD-10-CM

## 2023-07-23 MED ORDER — BUDESONIDE-FORMOTEROL FUMARATE 160-4.5 MCG/ACT IN AERO
2.0000 | INHALATION_SPRAY | Freq: Two times a day (BID) | RESPIRATORY_TRACT | 3 refills | Status: DC
Start: 2023-07-23 — End: 2024-01-24
  Filled 2023-07-23 – 2023-08-01 (×2): qty 10.2, 30d supply, fill #0
  Filled 2023-09-12: qty 10.2, 30d supply, fill #1
  Filled 2023-10-15: qty 10.2, 30d supply, fill #2
  Filled 2023-12-08 – 2024-01-24 (×2): qty 10.2, 30d supply, fill #3

## 2023-07-23 MED ORDER — PHENTERMINE HCL 37.5 MG PO TABS
18.7500 mg | ORAL_TABLET | Freq: Two times a day (BID) | ORAL | 0 refills | Status: DC
Start: 2023-07-23 — End: 2023-08-15
  Filled 2023-07-23: qty 30, 30d supply, fill #0

## 2023-07-24 ENCOUNTER — Other Ambulatory Visit: Payer: Self-pay

## 2023-07-26 ENCOUNTER — Other Ambulatory Visit: Payer: Self-pay

## 2023-07-26 ENCOUNTER — Other Ambulatory Visit (HOSPITAL_COMMUNITY): Payer: Self-pay

## 2023-07-27 ENCOUNTER — Other Ambulatory Visit: Payer: Self-pay

## 2023-07-27 ENCOUNTER — Ambulatory Visit (INDEPENDENT_AMBULATORY_CARE_PROVIDER_SITE_OTHER): Payer: MEDICAID | Admitting: Student in an Organized Health Care Education/Training Program

## 2023-07-27 ENCOUNTER — Other Ambulatory Visit (HOSPITAL_COMMUNITY): Payer: Self-pay

## 2023-07-27 ENCOUNTER — Other Ambulatory Visit: Payer: Self-pay | Admitting: Family Medicine

## 2023-07-27 DIAGNOSIS — F333 Major depressive disorder, recurrent, severe with psychotic symptoms: Secondary | ICD-10-CM | POA: Diagnosis not present

## 2023-07-27 DIAGNOSIS — F4310 Post-traumatic stress disorder, unspecified: Secondary | ICD-10-CM

## 2023-07-27 DIAGNOSIS — F411 Generalized anxiety disorder: Secondary | ICD-10-CM

## 2023-07-27 MED ORDER — CLONAZEPAM 0.5 MG PO TABS
1.0000 mg | ORAL_TABLET | Freq: Every day | ORAL | 3 refills | Status: DC
Start: 2023-07-27 — End: 2024-01-10
  Filled 2023-07-27 – 2023-08-15 (×2): qty 30, 15d supply, fill #0
  Filled 2023-10-23 – 2023-11-11 (×2): qty 30, 15d supply, fill #1
  Filled 2023-12-13 (×2): qty 30, 15d supply, fill #0
  Filled 2024-01-03: qty 30, 15d supply, fill #1

## 2023-07-27 MED ORDER — SERTRALINE HCL 100 MG PO TABS
100.0000 mg | ORAL_TABLET | Freq: Every day | ORAL | 2 refills | Status: DC
Start: 2023-07-27 — End: 2023-09-28
  Filled 2023-07-27 – 2023-08-08 (×2): qty 30, 30d supply, fill #0
  Filled 2023-09-12: qty 30, 30d supply, fill #1

## 2023-07-27 MED ORDER — TRAZODONE HCL 50 MG PO TABS
25.0000 mg | ORAL_TABLET | Freq: Every day | ORAL | 1 refills | Status: DC
Start: 2023-07-27 — End: 2023-11-13
  Filled 2023-07-27 (×2): qty 30, 60d supply, fill #0

## 2023-07-27 MED ORDER — ARIPIPRAZOLE 2 MG PO TABS
2.0000 mg | ORAL_TABLET | Freq: Every day | ORAL | 2 refills | Status: DC
Start: 2023-07-27 — End: 2023-09-28
  Filled 2023-07-27 – 2023-08-08 (×2): qty 30, 30d supply, fill #0
  Filled 2023-09-12: qty 30, 30d supply, fill #1

## 2023-07-27 NOTE — Progress Notes (Signed)
BH MD/PA/NP OP Progress Note  07/27/2023 2:29 PM Sherri [name]  MRN:  [id number]  Chief Complaint:  Chief Complaint  Patient presents with   Follow-up   HPI:  Sherri [name] is a 22 year old female seen today for follow-up psychiatric evaluation. She has a psychiatric history of bipolar disorder, SI, anxiety and depression.  Patient reports she has been compliant with the following medications   Abilify 2mg  daily Zoloft 100 mg daily Trazodone 50 mg nightly as needed- taking 25mg  QHS Klonopin 0.5mg  QHS, rx Dr. Dina Rich at [facility] Nexplanon Pheterm[place] (wgt loss clinic), restarted 3 days ago Vit D- daily Metformin 500mg  BID  Patient reports that she has been compliant. Patient reports that the Abilfy really helped she is not having AVH. She reports that she is no longer having feelings that someone is watching her or out to get her. Patient reports that in the first 2 weeks on  Abilify she was able to take her tent down and she is now able to sleep in her room alone, without feeling scared. Patient reports that her sister was able to move out of her room. Patient reports she does have a small night light. Patient reports that the first week on Zoloft, she felt like her heart rate increased she felt shakey. Patient reports that this went away after.   Patient does have a pending cardiology appt (on the wait list now because she cancelled due to office error), this was set back at the beginning of the year. The appt was initially created due to fast heart rate and breathing troubles.   Patient reports that her anxiety is fairly well controlled. Patient denies feeling depressed, she reports she feels "boredom."  Patient reports that she is able to enjoy watching anime. Patient reports that she is getting about 7h of sleep and feels well-rested. Patient reports that she takes the Trazodone and 3mg  melatonin at bedtime. Patient reports that 50mg  of trazodone led to her feeling to drowsy.    Patient reports that she still has some tics. Patient reports that she is moving, screaming, making strange noises, and sometimes she stops breathing for about 20 seconds. Sometimes she smacks herself in the face. Patient reports that she is seeing  Dr. Dina Rich and has an appt today. She does not feel the Klonopin is working as well as it should. Patient did have some neck movements right after talking about the tics.   Patient denies SI, HI, and AVH.  Patient denies hypervigilance, but does feel like loud noises or bright lights make her tics worse. Recommended that patient discuss this with Dr. Dina Rich. Patient denies any nightmares or flashbacks. Patient reports reports less avoidance behaviors.   Visit Diagnosis:    ICD-10-CM   1. PTSD (post-traumatic stress disorder)  F43.10 ARIPiprazole (ABILIFY) 2 MG tablet    sertraline (ZOLOFT) 100 MG tablet    2. Severe episode of recurrent major depressive disorder, with psychotic features (HCC)  F33.3 ARIPiprazole (ABILIFY) 2 MG tablet    sertraline (ZOLOFT) 100 MG tablet    3. Generalized anxiety disorder  F41.1 traZODone (DESYREL) 50 MG tablet    sertraline (ZOLOFT) 100 MG tablet      Past Psychiatric History: Bipolar disorder ? (Unsure of who dx her with this or when), SI, anxiety and depression  05/2023- EKG QTC: 413 2020- started having AVH, notes that it started right after her father died in 08/07/18. Things only got worse during Covid, she stopped going to  school even online.    11/2022-started patient on risperidone 0.5 mg twice daily due to patient endorsing stealing things and feeling more impulsive without being on risperidone.   03/2023- having nipple discharge on risperdal, dc'd risperdal. Concern that patient's  anxiety and related to Avoidant PD traits,   05/2023-pattern of depressive symptoms that coincide with the sudden onset of AVH then get better when her depression resolves . Started abilify 2mg . Continues to endorse multiple  PTSD symptoms. Increased Zoloft to 100mg . Continued Trazodone 25mg  w. OTC melatonin  Past Medical History:  Past Medical History:  Diagnosis Date    Chronic pain 02/02/2020   Abnormal vision screen 08/26/2015   Acanthosis nigricans 02/26/2020   Acid reflux    Allergy    Seasonal allergies   Anxiety    Phreesia 11/24/2020   Asthma    Atopic dermatitis 07/14/2013   Carpal tunnel syndrome, bilateral 11/21/2018   S/p bilat surgery, release   Chest pain 07/16/2019   Concussion 02/16/2021   Approx 2/15 d/t hit posterior skull against wall during tic.    Constipation 06/17/2013   Cough 01/29/2021   COVID-19 01/22/2020   Eczema    Elevated blood pressure reading 02/26/2020   Gallstones    GERD (gastroesophageal reflux disease)    Phreesia 11/24/2020   Headache    Obesity    Pain of both hip joints 11/10/2019   PCOS (polycystic ovarian syndrome) 02/26/2020   Recurrent viral infection 05/15/2022   Seasonal allergies    Tic    Toothache 11/06/2022    Past Surgical History:  Procedure Laterality Date   BILATERAL CARPAL TUNNEL RELEASE     CHOLECYSTECTOMY N/A 08/08/2017   Procedure: LAPAROSCOPIC CHOLECYSTECTOMY;  Surgeon: Kandice Hams, MD;  Location: [facility] OR;  Service: General;  Laterality: N/A;   UPPER GASTROINTESTINAL ENDOSCOPY      Family Psychiatric History: Mother depression, brother depression, Sister depression, 2 brother learning disability, a maternal aunt depression   Family History:  Family History  Problem Relation Age of Onset   Asthma Mother    Arthritis Mother    Depression Mother    Diabetes Mother    Hypertension Mother    Obesity Mother    Irritable bowel syndrome Mother    Asthma Sister    Learning disabilities Sister    Stroke Sister    Asthma Brother    Depression Brother    Learning disabilities Brother    Irritable bowel syndrome Brother    Depression Maternal Aunt    Diabetes Maternal Aunt    Mental illness Maternal Aunt    Diabetes  Maternal Uncle    Kidney disease Maternal Uncle    Arthritis Maternal Grandmother    Diabetes Maternal Grandmother    Heart disease Maternal Grandmother    Kidney disease Maternal Grandmother    Hypertension Maternal Grandmother    Hyperlipidemia Maternal Grandmother    Cervical cancer Maternal Grandmother    Stroke Maternal Grandfather    Cancer Other    COPD Other    Heart disease Other    Hypertension Other    Obesity Father    Heart attack Father    Heart disease Paternal Grandmother    Hypertension Paternal Grandmother    Colon cancer Neg Hx    Esophageal cancer Neg Hx    Pancreatic cancer Neg Hx    Stomach cancer Neg Hx     Social History:  Social History   Socioeconomic History   Marital status: Single  Spouse name: Not on file   Number of children: 0   Years of education: 77   Highest education level: GED or equivalent  Occupational History   Occupation: Furniture conservator/restorer to college   Occupation: Seeking disability  Tobacco Use   Smoking status: Never    Passive exposure: Yes   Smokeless tobacco: Never   Tobacco comments:    mom stopped smoking  Vaping Use   Vaping status: Never Used  Substance and Sexual Activity   Alcohol use: No   Drug use: No   Sexual activity: Never  Other Topics Concern   Not on file  Social History Narrative   Lives with Mom and some siblings, some of whom have children of their own, dropped out of [employer]. plans to start [employer] for GED.   Right-handed.   No daily use of caffeine.    Social Determinants of Health   Financial Resource Strain: Low Risk  (03/26/2023)   Received from [facility], [facility]   Overall Financial Resource Strain (CARDIA)    Difficulty of Paying Living Expenses: Not hard at all  Food Insecurity: No Food Insecurity (03/26/2023)   Received from [facility], [facility]   Hunger Vital Sign    Worried About Running Out of Food in the Last Year: Never true    Ran Out of Food in the Last Year: Never true   Transportation Needs: Patient Unable To Answer (03/26/2023)   Received from [facility], [facility]   [facility] - Transportation    Lack of Transportation (Medical): Patient unable to answer    Lack of Transportation (Non-Medical): Patient unable to answer  Physical Activity: Not on file  Stress: Not on file  Social Connections: Unknown (03/07/2023)   Received from [facility], [facility]   Social Network    Social Network: Not on file    Allergies:  Allergies  Allergen Reactions   Ibuprofen Other (See Comments)    Chest pain   Shellfish Allergy Hives and Itching    Mouth itches, facial hives.     Metabolic Disorder Labs: Lab Results  Component Value Date   HGBA1C 5.9 (H) 03/01/2023   MPG 123 02/11/2020   MPG 126 07/18/2019   Lab Results  Component Value Date   PROLACTIN 7.0 05/25/2023   PROLACTIN 12.2 07/18/2019   Lab Results  Component Value Date   CHOL 176 03/01/2023   TRIG 50 03/01/2023   HDL 73 03/01/2023   CHOLHDL 2.4 03/01/2023   VLDL 16 07/25/2013   LDLCALC 93 03/01/2023   LDLCALC 79 01/27/2021   Lab Results  Component Value Date   TSH 0.450 05/25/2023   TSH 0.820 01/11/2023    Therapeutic Level Labs: No results found for: "LITHIUM" No results found for: "VALPROATE" No results found for: "CBMZ"  Current Medications: Current Outpatient Medications  Medication Sig Dispense Refill   acetaminophen (TYLENOL) 500 MG tablet Take 1 tablet (500 mg total) by mouth every 4-6 hours as needed. 28 tablet 0   albuterol (PROVENTIL) (2.5 MG/3ML) 0.083% nebulizer solution Take 3 mLs (2.5 mg total) by nebulization every 6 (six) hours as needed for wheezing or shortness of breath. 75 mL 12   albuterol (VENTOLIN HFA) 108 (90 Base) MCG/ACT inhaler Inhale 2 puffs into the lungs every 6 (six) hours as needed for wheezing or shortness of breath. 18 g 4   ARIPiprazole (ABILIFY) 2 MG tablet Take 1 tablet (2 mg total) by mouth daily. 30 tablet 2    budesonide-formoterol (  SYMBICORT) 160-4.5 MCG/ACT inhaler Inhale 2 puffs into the lungs 2 (two) times daily. 10.2 g 3   cholecalciferol (VITAMIN D3) 25 MCG (1000 UNIT) tablet Take 1 tablet (1,000 Units total) by mouth daily. Return for vit D recheck when this bottle runs out. 90 tablet 0   clonazePAM (KLONOPIN) 0.5 MG tablet Take 1 tablet (0.5 mg total) by mouth Nightly. 30 tablet 3   clonazePAM (KLONOPIN) 0.5 MG tablet Take 1 tablet (0.5 mg total) by mouth at bedtime. 30 tablet 3   dicyclomine (BENTYL) 10 MG capsule Take 1 capsule (10 mg total) by mouth 2 (two) times daily as needed for spasms. 60 capsule 0   etonogestrel (NEXPLANON) 68 MG IMPL implant 1 each (68 mg total) by Subdermal route once. 1 each 0   famotidine (PEPCID) 20 MG tablet Take 1 tablet (20 mg total) by mouth 2 (two) times daily. 180 tablet 1   ibuprofen (ADVIL) 800 MG tablet Take 1 tablet (800 mg total) by mouth every 6 (six) hours as needed for pain. 21 tablet 0   linaclotide (LINZESS) 72 MCG capsule Take 1 capsule (72 mcg total) by mouth daily before breakfast. 90 capsule 3   melatonin 3 MG TABS tablet Take 1 tablet (3 mg total) by mouth at bedtime. 30 tablet 3   metFORMIN (GLUCOPHAGE) 500 MG tablet Take 1 tablet (500 mg total) by mouth 2 (two) times daily with a meal. 180 tablet 3   montelukast (SINGULAIR) 10 MG tablet Take 1 tablet (10 mg total) by mouth at bedtime. 90 tablet 3   ondansetron (ZOFRAN) 4 MG tablet Take 1 tablet (4 mg total) by mouth every 8 (eight) hours as needed for nausea or vomiting. 20 tablet 0   pantoprazole (PROTONIX) 20 MG tablet Take 1 tablet (20 mg total) by mouth 2 (two) times daily. 60 tablet 2   phentermine (ADIPEX-P) 37.5 MG tablet Take 1/2 tablet (18.75 mg total) by mouth 2 (two) times daily 30 minutes before breakfast and 30 minutes before lunch 30 tablet 0   sertraline (ZOLOFT) 100 MG tablet Take 1 tablet (100 mg total) by mouth daily. 30 tablet 2   Spacer/Aero Chamber Mouthpiece MISC 1 Device  by Does not apply route as needed. 1 each 0   SUMAtriptan (IMITREX) 25 MG tablet Take 1 tablet (25 mg total) by mouth every 2 (two) hours as needed for migraine. May repeat in 2 hours if headache persists or recurs. Maximum 200MG  in 24 hours 10 tablet 0   traZODone (DESYREL) 50 MG tablet Take 1/2 tablet (25 mg total) by mouth at bedtime. 30 tablet 1   triamcinolone cream in Minerin Creme Apply 1 application topically 2 (two) times daily. Do not use for more than 7 consecutive days 454 g 1   No current facility-administered medications for this visit.     Musculoskeletal: Strength & Muscle Tone: within normal limits Gait & Station: normal Patient leans: N/A  Psychiatric Specialty Exam: Review of Systems  Psychiatric/Behavioral:  Negative for dysphoric mood, hallucinations and suicidal ideas. The patient is not nervous/anxious.     Blood pressure 108/78, pulse (!) 114, resp. rate 20, weight 273 lb (123.8 kg), SpO2 95%.Body mass index is 51.58 kg/m.  General Appearance: Fairly Groomed  Eye Contact:  Good  Speech:  Clear and Coherent  Volume:  Normal  Mood:  Euthymic  Affect:  Appropriate  Thought Process:  Coherent  Orientation:  Full (Time, Place, and Person)  Thought Content: Logical   Suicidal Thoughts:  No  Homicidal Thoughts:  No  Memory:  Immediate;   Good Recent;   Good  Judgement:  Good  Insight:  Fair  Psychomotor Activity:   Tic like movements in neck  Concentration:  Concentration: Good  Recall:  Good  Fund of Knowledge: Good  Language: Good  Akathisia:  No  Handed:    AIMS (if indicated): done  Assets:  Communication Skills Desire for Improvement Housing Leisure Time Resilience Social Support Transportation  ADL's:  Intact  Cognition: WNL  Sleep:  Good   Screenings: AIMS    Flowsheet Row Clinical Support from 07/27/2023 in [facility]  AIMS Total Score 4      GAD-7    Flowsheet Row Video Visit from 09/06/2022 in  [facility] Clinical Support from 01/30/2022 in [facility] Office Visit from 11/03/2021 in [facility] Office Visit from 06/08/2020 in [facility] Video Visit from 02/10/2020 in [facility]  Total GAD-7 Score 13 19 18 12 21       PHQ2-9    Flowsheet Row Office Visit from 05/25/2023 in [facility] Most recent reading at 05/25/2023  1:50 PM Office Visit from 03/30/2023 in [facility] Center Most recent reading at 03/30/2023  1:19 PM Office Visit from 03/30/2023 in [facility] Most recent reading at 03/30/2023 10:56 AM Office Visit from 03/01/2023 in [facility] Center Most recent reading at 03/01/2023 11:01 AM Office Visit from 01/23/2023 in [facility] Center Most recent reading at 01/23/2023  9:46 AM  PHQ-2 Total Score 5 1 1 2 4   PHQ-9 Total Score 19 7 8 10 14       Flowsheet Row ED from 02/04/2023 in [facility] Department at [facility] Video Visit from 06/05/2022 in [facility] ED from 05/01/2022 in [facility] RISK CATEGORY No Risk Error: Q7 should not be populated when Q6 is No No Risk        Assessment and Plan: Patient appears to be doing fairly well as she is endorsing significant decrease in her PTSD symptoms however her tics appear to be associated with hyperarousal.  Patient endorses a wish for her tics to subside completely and interestingly again during assessment patient did not have any tics until the topic was brought up by provider.  Patient's tics were also noticeable when attempting to do mains and focusing on patient's body movements however the tics remained in her neck.  Patient endorses that she is having verbal tics however  none were noted during assessment today.  This is similar to the patient seemingly being less hypervigilant once she was able to discuss some of her trauma and the related symptoms during her last appointment.  Will not increase patient's Zoloft or Abilify although both appear to be benefiting patient.  Patient was tachycardic on assessment today and did endorse that she had had some possible palpitations and feelings of restlessness when her Zoloft was increased to 100 mg initially however this subsided.  Patient has not yet been on this dose for 1 month and he continued receive benefit.  Patient also reported having some cardiac symptoms leading to cardiology referral prior to being on Zoloft and Abilify.  While increasing patient's Abilify may be helpful for  her tics, we will hold today she will be seeing Dr. Dina Rich this afternoon however, he is welcome to increase.  However we will leave treatment of tics to him as this is a specialty and he is seeing patient specifically for this.  Will continue to monitor patient on current regimen.  Will also enroll patient in therapy.  PTSD MDD,severe w/ psychotic features - continue Abilify to 2mg  daily - Continue Zoloft 100mg  - Continue Trazodone 25mg  w/ OTC melatonin for sleep PRN  Tic/ movement d/o of etiology likely psychiatrically related - reoccurring   Collaboration of Care: Collaboration of Care:   Patient/Guardian was advised Release of Information must be obtained prior to any record release in order to collaborate their care with an outside provider. Patient/Guardian was advised if they have not already done so to contact the registration department to sign all necessary forms in order for [place] to release information regarding their care.   Consent: Patient/Guardian gives verbal consent for treatment and assignment of benefits for services provided during this visit. Patient/Guardian expressed understanding and agreed to proceed.   PGY-4 [name], MD 07/27/2023, 2:29 PM

## 2023-07-30 ENCOUNTER — Other Ambulatory Visit: Payer: Self-pay

## 2023-07-30 ENCOUNTER — Other Ambulatory Visit (HOSPITAL_COMMUNITY): Payer: Self-pay

## 2023-07-31 ENCOUNTER — Other Ambulatory Visit (HOSPITAL_COMMUNITY): Payer: Self-pay

## 2023-07-31 MED ORDER — PANTOPRAZOLE SODIUM 20 MG PO TBEC
20.0000 mg | DELAYED_RELEASE_TABLET | Freq: Two times a day (BID) | ORAL | 2 refills | Status: DC
  Filled 2023-07-31 – 2023-08-08 (×2): qty 60, 30d supply, fill #0
  Filled 2023-09-12: qty 60, 30d supply, fill #1
  Filled 2023-10-15: qty 60, 30d supply, fill #2

## 2023-08-02 ENCOUNTER — Other Ambulatory Visit: Payer: Self-pay

## 2023-08-03 ENCOUNTER — Other Ambulatory Visit (HOSPITAL_COMMUNITY): Payer: Self-pay

## 2023-08-08 ENCOUNTER — Other Ambulatory Visit (HOSPITAL_COMMUNITY): Payer: Self-pay

## 2023-08-08 ENCOUNTER — Other Ambulatory Visit: Payer: Self-pay

## 2023-08-13 ENCOUNTER — Other Ambulatory Visit (HOSPITAL_COMMUNITY): Payer: Self-pay

## 2023-08-14 ENCOUNTER — Other Ambulatory Visit (HOSPITAL_COMMUNITY): Payer: Self-pay

## 2023-08-15 ENCOUNTER — Other Ambulatory Visit (HOSPITAL_COMMUNITY): Payer: Self-pay

## 2023-08-15 MED ORDER — PHENTERMINE HCL 37.5 MG PO TABS
37.5000 mg | ORAL_TABLET | Freq: Every day | ORAL | 1 refills | Status: DC
  Filled 2023-08-15 – 2023-08-17 (×7): qty 30, 30d supply, fill #0
  Filled ????-??-?? (×4): fill #0

## 2023-08-16 ENCOUNTER — Other Ambulatory Visit (HOSPITAL_COMMUNITY): Payer: Self-pay

## 2023-08-16 ENCOUNTER — Other Ambulatory Visit: Payer: Self-pay

## 2023-08-17 ENCOUNTER — Other Ambulatory Visit: Payer: Self-pay

## 2023-08-17 ENCOUNTER — Other Ambulatory Visit (HOSPITAL_COMMUNITY): Payer: Self-pay

## 2023-08-23 ENCOUNTER — Encounter (HOSPITAL_COMMUNITY): Payer: Self-pay

## 2023-08-23 ENCOUNTER — Other Ambulatory Visit (HOSPITAL_COMMUNITY): Payer: Self-pay

## 2023-08-23 ENCOUNTER — Other Ambulatory Visit: Payer: Self-pay

## 2023-08-24 ENCOUNTER — Other Ambulatory Visit (HOSPITAL_COMMUNITY): Payer: Self-pay

## 2023-09-12 ENCOUNTER — Other Ambulatory Visit: Payer: Self-pay

## 2023-09-12 NOTE — Progress Notes (Deleted)
Cardiology Office Note:  .   Date:  09/12/2023  ID:  Sherri [name], DOB 2001-09-16, MRN [id number] PCP: Shelby Mattocks, DO  [facility] Providers Cardiologist:  None { Click to update primary MD,subspecialty MD or APP then REFRESH:1}   History of Present Illness: .   Sherri [name] is a 22 y.o. female with hx of migraines, asthma, IBS, referral for CP. She noted intermittent CP with her family physician. She had a reassuring EKG in the office. She had sinus tachycardia. C/f being anxious. This was in June 2024. She reported chest discomfort in March of 2021. This was in the setting of GI symptoms. Her echo was unremarkable. She was diagnosed with MSK/chest wall pain at that time.  Father had MI  ROS:  per HPI otherwise negative   Studies Reviewed: .        *** Risk Assessment/Calculations:   {Does this patient have ATRIAL FIBRILLATION?[phone]} No BP recorded.  {Refresh Note OR Click here to enter BP  :1}***       Physical Exam:   VS:  There were no vitals taken for this visit.   Wt Readings from Last 3 Encounters:  05/25/23 268 lb (121.6 kg)  03/30/23 293 lb 6.1 oz (133.1 kg)  03/30/23 293 lb 6 oz (133.1 kg)    GEN: Well nourished, well developed in no acute distress NECK: No JVD; No carotid bruits CARDIAC: ***RRR, no murmurs, rubs, gallops RESPIRATORY:  Clear to auscultation without rales, wheezing or rhonchi  ABDOMEN: Soft, non-tender, non-distended EXTREMITIES:  No edema; No deformity   ASSESSMENT AND PLAN: .   CP She has low risk of CVD and no diagnosis of HOCM. She has had a prior work up in 2021. Suspect this is GI and/or MSK pain still.      Dispo: Follow up PRN, no cardiac disease  Signed, Kari Montero, Alben Spittle, MD

## 2023-09-13 ENCOUNTER — Ambulatory Visit: Payer: MEDICAID | Admitting: Internal Medicine

## 2023-09-17 ENCOUNTER — Other Ambulatory Visit (HOSPITAL_COMMUNITY): Payer: Self-pay

## 2023-09-20 ENCOUNTER — Other Ambulatory Visit: Payer: Self-pay

## 2023-09-20 ENCOUNTER — Other Ambulatory Visit (HOSPITAL_COMMUNITY): Payer: Self-pay

## 2023-09-28 ENCOUNTER — Encounter (HOSPITAL_COMMUNITY): Payer: Self-pay | Admitting: Student in an Organized Health Care Education/Training Program

## 2023-09-28 ENCOUNTER — Other Ambulatory Visit: Payer: Self-pay

## 2023-09-28 ENCOUNTER — Telehealth (HOSPITAL_COMMUNITY): Payer: MEDICAID | Admitting: Student in an Organized Health Care Education/Training Program

## 2023-09-28 ENCOUNTER — Other Ambulatory Visit (HOSPITAL_COMMUNITY): Payer: Self-pay

## 2023-09-28 DIAGNOSIS — F411 Generalized anxiety disorder: Secondary | ICD-10-CM

## 2023-09-28 DIAGNOSIS — F4310 Post-traumatic stress disorder, unspecified: Secondary | ICD-10-CM

## 2023-09-28 DIAGNOSIS — F333 Major depressive disorder, recurrent, severe with psychotic symptoms: Secondary | ICD-10-CM

## 2023-09-28 MED ORDER — SERTRALINE HCL 100 MG PO TABS
100.0000 mg | ORAL_TABLET | Freq: Every day | ORAL | 3 refills | Status: DC
Start: 2023-09-28 — End: 2023-11-13
  Filled 2023-09-28 – 2023-11-13 (×10): qty 30, 30d supply, fill #0

## 2023-09-28 MED ORDER — ARIPIPRAZOLE 5 MG PO TABS
5.0000 mg | ORAL_TABLET | Freq: Every day | ORAL | 3 refills | Status: DC
Start: 2023-09-28 — End: 2023-11-13
  Filled 2023-09-28 – 2023-11-12 (×7): qty 30, 30d supply, fill #0

## 2023-09-28 NOTE — Progress Notes (Signed)
Virtual Visit via Video Note  I connected with Sherri [name] on 09/28/23 at 11:00 AM EDT by a video enabled telemedicine application and verified that I am speaking with the correct person using two identifiers.  Location: Patient: Home Provider: Office   I discussed the limitations of evaluation and management by telemedicine and the availability of in person appointments. The patient expressed understanding and agreed to proceed.    I discussed the assessment and treatment plan with the patient. The patient was provided an opportunity to ask questions and all were answered. The patient agreed with the plan and demonstrated an understanding of the instructions.   The patient was advised to call back or seek an in-person evaluation if the symptoms worsen or if the condition fails to improve as anticipated.  I provided 25 minutes of non-face-to-face time during this encounter.   [name], MD [facility] MD/PA/NP OP Progress Note  09/28/2023 2:15 PM Sherri [name]  MRN:  [id number]  Chief Complaint:  No chief complaint on file.  HPI:  Sherri [name] is a 22 year old female seen today for follow-up psychiatric evaluation. She has a psychiatric history of bipolar disorder, SI, anxiety and depression.  Patient reports she has been compliant with the following medications   Abilify 2mg  daily Zoloft 100 mg daily Trazodone 50 mg nightly as needed- taking 25mg  at bedtime (not needing) Klonopin 1mg  QHS, rx Dr. Dina Rich at [facility] Nexplanon Phetermine (wgt loss clinic), - Vit D- daily Metformin 500mg  BID  Patient reports that she is doing "alright." Patient reports she has not been needing trazodone and is sleeping a full 8hrs, but is sleeping a lot during the day. Patient reports that this has been going on the last 2-3 weeks. Patient reports that she cannot recall any medication changes, but she has been busier doing hair and this does make her more tired. Patient has not been  drawing, but she has been packing. Patient reports that the family is moving, but she is not sure where they are moving. Patient reports that the complex they are in is not kept up well and they want to move.  Patient reports that there is some financial stress on the family. Patient reports that she feels overall despite this she feels mostly fine. Patient reports that she does not really go anywhere. She is trying to get better at doing hair and get control over her Tourette's to possibly try to have it as a career. Patient denies SI both passive and active. Patient denies feeling hopeless or worthless. Patient denies HI. Patient endorses mild AVH. Patient reports that her AVH is not as bad as it used to be. Patient reports that the [facility] is a black shadow either in the corner of her eye or hiding behind objects. Patient reports that she hears people talking, that no one else can hear. Patient reports that the voices are mumbling and she can't understand them, but says they are having a conversation. Patient reports that it is not as scary. Patient denies feeling paranoid or hypervigilant.   Patient reports that her tics are about the same, she thinks they may be getting a bit worse. Patient reports seh developed a new tic where she holds her breath, this one does not happen often but it can be scary.   Patient is still interested in painting for money, but has stopped due to being low on paint. Patient reports she needs her disability check may help with this,  but she has to help with bills first.   Patient is the youngest of 5 girls. Patient reports being closest to the 3rd oldest, but she moved about 1 week ago for a new job, but they are closest.   Visit Diagnosis:    ICD-10-CM   1. PTSD (post-traumatic stress disorder)  F43.10 ARIPiprazole (ABILIFY) 5 MG tablet    sertraline (ZOLOFT) 100 MG tablet    2. Severe episode of recurrent major depressive disorder, with psychotic features (HCC)  F33.3  ARIPiprazole (ABILIFY) 5 MG tablet    sertraline (ZOLOFT) 100 MG tablet    3. Generalized anxiety disorder  F41.1 sertraline (ZOLOFT) 100 MG tablet       Past Psychiatric History: Bipolar disorder ? (Unsure of who dx her with this or when), SI, anxiety and depression  05/2023- EKG QTC: 413 2020- started having AVH, notes that it started right after her father died in 10/10/2018. Things only got worse during Covid, she stopped going to school even online.    11/2022-started patient on risperidone 0.5 mg twice daily due to patient endorsing stealing things and feeling more impulsive without being on risperidone.   03/2023- having nipple discharge on risperdal, dc'd risperdal. Concern that patient's  anxiety and related to Avoidant PD traits,   05/2023-pattern of depressive symptoms that coincide with the sudden onset of AVH then get better when her depression resolves . Started abilify 2mg . Continues to endorse multiple PTSD symptoms. Increased Zoloft to 100mg . Continued Trazodone 25mg  w. OTC melatonin  07/2023-Appears to be doing fairly well as she is endorsing significant decrease in her PTSD symptoms however her tics appear to be associated with hyperarousal. No med adjustments made  Past Medical History:  Past Medical History:  Diagnosis Date    Chronic pain 02/02/2020   Abnormal vision screen 08/26/2015   Acanthosis nigricans 02/26/2020   Acid reflux    Allergy    Seasonal allergies   Anxiety    Phreesia 11/24/2020   Asthma    Atopic dermatitis 07/14/2013   Carpal tunnel syndrome, bilateral 11/21/2018   S/p bilat surgery, release   Chest pain 07/16/2019   Concussion 02/16/2021   Approx 2/15 d/t hit posterior skull against wall during tic.    Constipation 06/17/2013   Cough 01/29/2021   COVID-19 01/22/2020   Eczema    Elevated blood pressure reading 02/26/2020   Gallstones    GERD (gastroesophageal reflux disease)    Phreesia 11/24/2020   Headache    Obesity    Pain of both  hip joints 11/10/2019   PCOS (polycystic ovarian syndrome) 02/26/2020   Recurrent viral infection 05/15/2022   Seasonal allergies    Tic    Toothache 11/06/2022    Past Surgical History:  Procedure Laterality Date   BILATERAL CARPAL TUNNEL RELEASE     CHOLECYSTECTOMY N/A 08/08/2017   Procedure: LAPAROSCOPIC CHOLECYSTECTOMY;  Surgeon: Kandice Hams, MD;  Location: [facility] OR;  Service: General;  Laterality: N/A;   UPPER GASTROINTESTINAL ENDOSCOPY      Family Psychiatric History: Mother depression and OCD, brother depression, Sister depression, 2 brother learning disability, a maternal aunt depression   Family History:  Family History  Problem Relation Age of Onset   Asthma Mother    Arthritis Mother    Depression Mother    Diabetes Mother    Hypertension Mother    Obesity Mother    Irritable bowel syndrome Mother    Asthma Sister    Learning disabilities Sister  Stroke Sister    Asthma Brother    Depression Brother    Learning disabilities Brother    Irritable bowel syndrome Brother    Depression Maternal Aunt    Diabetes Maternal Aunt    Mental illness Maternal Aunt    Diabetes Maternal Uncle    Kidney disease Maternal Uncle    Arthritis Maternal Grandmother    Diabetes Maternal Grandmother    Heart disease Maternal Grandmother    Kidney disease Maternal Grandmother    Hypertension Maternal Grandmother    Hyperlipidemia Maternal Grandmother    Cervical cancer Maternal Grandmother    Stroke Maternal Grandfather    Cancer Other    COPD Other    Heart disease Other    Hypertension Other    Obesity Father    Heart attack Father    Heart disease Paternal Grandmother    Hypertension Paternal Grandmother    Colon cancer Neg Hx    Esophageal cancer Neg Hx    Pancreatic cancer Neg Hx    Stomach cancer Neg Hx     Social History:  Social History   Socioeconomic History   Marital status: Single    Spouse name: Not on file   Number of children: 0   Years of  education: 12   Highest education level: GED or equivalent  Occupational History   Occupation: Furniture conservator/restorer to college   Occupation: Seeking disability  Tobacco Use   Smoking status: Never    Passive exposure: Yes   Smokeless tobacco: Never   Tobacco comments:    mom stopped smoking  Vaping Use   Vaping status: Never Used  Substance and Sexual Activity   Alcohol use: No   Drug use: No   Sexual activity: Never  Other Topics Concern   Not on file  Social History Narrative   Lives with Mom and some siblings, some of whom have children of their own, dropped out of [employer]. plans to start [employer] for GED.   Right-handed.   No daily use of caffeine.    Social Determinants of Health   Financial Resource Strain: Low Risk  (03/26/2023)   Received from [facility], [facility]   Overall Financial Resource Strain (CARDIA)    Difficulty of Paying Living Expenses: Not hard at all  Food Insecurity: No Food Insecurity (03/26/2023)   Received from [facility], [facility]   Hunger Vital Sign    Worried About Running Out of Food in the Last Year: Never true    Ran Out of Food in the Last Year: Never true  Transportation Needs: Patient Unable To Answer (03/26/2023)   Received from [facility], [facility]   [facility] - Transportation    Lack of Transportation (Medical): Patient unable to answer    Lack of Transportation (Non-Medical): Patient unable to answer  Physical Activity: Not on file  Stress: Not on file  Social Connections: Unknown (03/07/2023)   Received from [facility], [facility]   Social Network    Social Network: Not on file    Allergies:  Allergies  Allergen Reactions   Ibuprofen Other (See Comments)    Chest pain   Shellfish Allergy Hives and Itching    Mouth itches, facial hives.     Metabolic Disorder Labs: Lab Results  Component Value Date   HGBA1C 5.9 (H) 03/01/2023   MPG 123 02/11/2020   MPG 126 07/18/2019   Lab Results  Component Value Date    PROLACTIN 7.0 05/25/2023   PROLACTIN 12.2  07/18/2019   Lab Results  Component Value Date   CHOL 176 03/01/2023   TRIG 50 03/01/2023   HDL 73 03/01/2023   CHOLHDL 2.4 03/01/2023   VLDL 16 07/25/2013   LDLCALC 93 03/01/2023   LDLCALC 79 01/27/2021   Lab Results  Component Value Date   TSH 0.450 05/25/2023   TSH 0.820 01/11/2023    Therapeutic Level Labs: No results found for: "LITHIUM" No results found for: "VALPROATE" No results found for: "CBMZ"  Current Medications: Current Outpatient Medications  Medication Sig Dispense Refill   acetaminophen (TYLENOL) 500 MG tablet Take 1 tablet (500 mg total) by mouth every 4-6 hours as needed. 28 tablet 0   albuterol (PROVENTIL) (2.5 MG/3ML) 0.083% nebulizer solution Take 3 mLs (2.5 mg total) by nebulization every 6 (six) hours as needed for wheezing or shortness of breath. 75 mL 12   albuterol (VENTOLIN HFA) 108 (90 Base) MCG/ACT inhaler Inhale 2 puffs into the lungs every 6 (six) hours as needed for wheezing or shortness of breath. 18 g 4   ARIPiprazole (ABILIFY) 5 MG tablet Take 1 tablet (5 mg total) by mouth daily. 30 tablet 3   budesonide-formoterol (SYMBICORT) 160-4.5 MCG/ACT inhaler Inhale 2 puffs into the lungs 2 (two) times daily. 10.2 g 3   cholecalciferol (VITAMIN D3) 25 MCG (1000 UNIT) tablet Take 1 tablet (1,000 Units total) by mouth daily. Return for vit D recheck when this bottle runs out. 90 tablet 0   clonazePAM (KLONOPIN) 0.5 MG tablet Take 1 tablet (0.5 mg total) by mouth Nightly. 30 tablet 3   clonazePAM (KLONOPIN) 0.5 MG tablet Take 1 tablet (0.5 mg total) by mouth at bedtime. 30 tablet 3   clonazePAM (KLONOPIN) 0.5 MG tablet Take 2 tablets (1 mg total) by mouth nightly 30 tablet 3   dicyclomine (BENTYL) 10 MG capsule Take 1 capsule (10 mg total) by mouth 2 (two) times daily as needed for spasms. 60 capsule 0   etonogestrel (NEXPLANON) 68 MG IMPL implant 1 each (68 mg total) by Subdermal route once. 1 each 0    famotidine (PEPCID) 20 MG tablet Take 1 tablet (20 mg total) by mouth 2 (two) times daily. 180 tablet 1   ibuprofen (ADVIL) 800 MG tablet Take 1 tablet (800 mg total) by mouth every 6 (six) hours as needed for pain. 21 tablet 0   linaclotide (LINZESS) 72 MCG capsule Take 1 capsule (72 mcg total) by mouth daily before breakfast. 90 capsule 3   melatonin 3 MG TABS tablet Take 1 tablet (3 mg total) by mouth at bedtime. 30 tablet 3   metFORMIN (GLUCOPHAGE) 500 MG tablet Take 1 tablet (500 mg total) by mouth 2 (two) times daily with a meal. 180 tablet 3   montelukast (SINGULAIR) 10 MG tablet Take 1 tablet (10 mg total) by mouth at bedtime. 90 tablet 3   ondansetron (ZOFRAN) 4 MG tablet Take 1 tablet (4 mg total) by mouth every 8 (eight) hours as needed for nausea or vomiting. 20 tablet 0   pantoprazole (PROTONIX) 20 MG tablet Take 1 tablet (20 mg total) by mouth 2 (two) times daily. 60 tablet 2   phentermine (ADIPEX-P) 37.5 MG tablet Take 1 tablet (37.5 mg total) by mouth daily 30 (thirty) minutes before before breakfast. 30 tablet 1   sertraline (ZOLOFT) 100 MG tablet Take 1 tablet (100 mg total) by mouth daily. 30 tablet 3   Spacer/Aero Chamber Mouthpiece MISC 1 Device by Does not apply route as needed. 1  each 0   SUMAtriptan (IMITREX) 25 MG tablet Take 1 tablet (25 mg total) by mouth every 2 (two) hours as needed for migraine. May repeat in 2 hours if headache persists or recurs. Maximum 200MG  in 24 hours 10 tablet 0   traZODone (DESYREL) 50 MG tablet Take 1/2 tablet (25 mg total) by mouth at bedtime. 30 tablet 1   triamcinolone cream in Minerin Creme Apply 1 application topically 2 (two) times daily. Do not use for more than 7 consecutive days 454 g 1   No current facility-administered medications for this visit.     Psychiatric Specialty Exam: Review of Systems  Psychiatric/Behavioral:  Positive for hallucinations. Negative for dysphoric mood and suicidal ideas. The patient is not  nervous/anxious.     There were no vitals taken for this visit.There is no height or weight on file to calculate BMI.  General Appearance: Fairly Groomed  Eye Contact:  Good  Speech:  Clear and Coherent  Volume:  Normal  Mood:  Euthymic  Affect:  Appropriate  Thought Process:  Coherent  Orientation:  Full (Time, Place, and Person)  Thought Content: Logical   Suicidal Thoughts:  No  Homicidal Thoughts:  No  Memory:  Immediate;   Good Recent;   Good  Judgement:  Good  Insight:  Fair  Psychomotor Activity:   Tic like movements in neck  Concentration:  Concentration: Good  Recall:  Good  Fund of Knowledge: Good  Language: Good  Akathisia:  No  Handed:    AIMS (if indicated): done  Assets:  Communication Skills Desire for Improvement Housing Leisure Time Resilience Social Support Transportation  ADL's:  Intact  Cognition: WNL  Sleep:  Good   Screenings: AIMS    Flowsheet Row Clinical Support from 07/27/2023 in [facility]  AIMS Total Score 4      GAD-7    Flowsheet Row Video Visit from 09/06/2022 in [facility] Clinical Support from 01/30/2022 in [facility] Office Visit from 11/03/2021 in [facility] Office Visit from 06/08/2020 in [facility] Video Visit from 02/10/2020 in [facility]  Total GAD-7 Score 13 19 18 12 21       PHQ2-9    Flowsheet Row Office Visit from 05/25/2023 in [facility] Most recent reading at 05/25/2023  1:50 PM Office Visit from 03/30/2023 in [facility] Most recent reading at 03/30/2023  1:19 PM Office Visit from 03/30/2023 in [facility] Most recent reading at 03/30/2023 10:56 AM Office Visit from 03/01/2023 in [facility] Most recent reading  at 03/01/2023 11:01 AM Office Visit from 01/23/2023 in [facility] Center Most recent reading at 01/23/2023  9:46 AM  PHQ-2 Total Score 5 1 1 2 4   PHQ-9 Total Score 19 7 8 10 14       Flowsheet Row ED from 02/04/2023 in [facility] at [facility] Video Visit from 06/05/2022 in [facility] ED from 05/01/2022 in [facility] RISK CATEGORY No Risk Error: Q7 should not be populated when Q6 is No No Risk        Assessment and Plan:  Patient PTSD symptoms seem overall better controlled, but patient is still endorsing mild AVH. Will increase Abilify as  this has been helpful for anxiety and AVH. This may also help with tic symptoms. Patient is endorsing some recent stressors such as moving and her closest sister recently leaving the home which could be contributing to worsened tics. However, overall patient judgement remains good and patient is trying to keep herself occupied and creating goals.    PTSD MDD,severe w/ psychotic features - increase Abilify to 5mg  daily - Continue Zoloft 100mg  - Continue Trazodone 25mg  w/ OTC melatonin for sleep PRN  Tic/ movement d/o of etiology likely psychiatrically related - reoccurring, see's Dr. Dina Rich  Collaboration of Care: Collaboration of Care:   Patient/Guardian was advised Release of Information must be obtained prior to any record release in order to collaborate their care with an outside provider. Patient/Guardian was advised if they have not already done so to contact the registration department to sign all necessary forms in order for [place] to release information regarding their care.   Consent: Patient/Guardian gives verbal consent for treatment and assignment of benefits for services provided during this visit. Patient/Guardian expressed understanding and agreed to proceed.   PGY-4 [name], MD 09/28/2023, 2:15 PM

## 2023-09-28 NOTE — Addendum Note (Signed)
Addended by: Eliseo Gum B on: 09/28/2023 02:16 PM   Modules accepted: Level of Service

## 2023-10-15 ENCOUNTER — Other Ambulatory Visit: Payer: Self-pay

## 2023-10-15 ENCOUNTER — Other Ambulatory Visit: Payer: Self-pay | Admitting: Physician Assistant

## 2023-10-15 ENCOUNTER — Other Ambulatory Visit: Payer: Self-pay | Admitting: Family Medicine

## 2023-10-15 ENCOUNTER — Other Ambulatory Visit (HOSPITAL_COMMUNITY): Payer: Self-pay

## 2023-10-15 DIAGNOSIS — J4530 Mild persistent asthma, uncomplicated: Secondary | ICD-10-CM

## 2023-10-15 DIAGNOSIS — K2960 Other gastritis without bleeding: Secondary | ICD-10-CM

## 2023-10-15 DIAGNOSIS — J4540 Moderate persistent asthma, uncomplicated: Secondary | ICD-10-CM

## 2023-10-16 ENCOUNTER — Other Ambulatory Visit (HOSPITAL_COMMUNITY): Payer: Self-pay

## 2023-10-16 ENCOUNTER — Other Ambulatory Visit: Payer: Self-pay

## 2023-10-16 MED ORDER — ALBUTEROL SULFATE HFA 108 (90 BASE) MCG/ACT IN AERS
2.0000 | INHALATION_SPRAY | Freq: Four times a day (QID) | RESPIRATORY_TRACT | 4 refills | Status: AC | PRN
Start: 2023-10-16 — End: ?
  Filled 2023-10-16: qty 18, 25d supply, fill #0
  Filled 2023-11-12: qty 18, 25d supply, fill #1
  Filled 2023-12-20 – 2023-12-26 (×4): qty 18, 25d supply, fill #2

## 2023-10-17 ENCOUNTER — Other Ambulatory Visit (HOSPITAL_COMMUNITY): Payer: Self-pay

## 2023-10-17 ENCOUNTER — Other Ambulatory Visit: Payer: Self-pay

## 2023-10-23 ENCOUNTER — Other Ambulatory Visit (HOSPITAL_COMMUNITY): Payer: Self-pay

## 2023-10-23 ENCOUNTER — Other Ambulatory Visit: Payer: Self-pay | Admitting: Physician Assistant

## 2023-10-23 ENCOUNTER — Other Ambulatory Visit: Payer: Self-pay

## 2023-10-23 DIAGNOSIS — K2960 Other gastritis without bleeding: Secondary | ICD-10-CM

## 2023-10-23 MED ORDER — LINACLOTIDE 72 MCG PO CAPS
72.0000 ug | ORAL_CAPSULE | Freq: Every day | ORAL | 3 refills | Status: DC
  Filled 2023-10-23: qty 90, 90d supply, fill #0
  Filled 2023-12-08 – 2024-01-09 (×2): qty 90, 90d supply, fill #1
  Filled 2024-01-10: qty 90, 90d supply, fill #0
  Filled 2024-04-16 – 2024-07-12 (×2): qty 90, 90d supply, fill #1
  Filled 2024-07-12: qty 90, 90d supply, fill #0
  Filled 2024-09-12: qty 90, 90d supply, fill #1

## 2023-10-23 MED ORDER — FAMOTIDINE 20 MG PO TABS
20.0000 mg | ORAL_TABLET | Freq: Two times a day (BID) | ORAL | 1 refills | Status: DC
Start: 2023-10-23 — End: 2024-04-16
  Filled 2023-10-23: qty 180, 90d supply, fill #0
  Filled 2024-01-24: qty 180, 90d supply, fill #1

## 2023-10-24 ENCOUNTER — Other Ambulatory Visit: Payer: Self-pay

## 2023-10-24 ENCOUNTER — Other Ambulatory Visit (HOSPITAL_COMMUNITY): Payer: Self-pay

## 2023-10-25 ENCOUNTER — Other Ambulatory Visit (HOSPITAL_COMMUNITY): Payer: Self-pay

## 2023-10-26 ENCOUNTER — Other Ambulatory Visit: Payer: Self-pay

## 2023-10-29 ENCOUNTER — Other Ambulatory Visit: Payer: Self-pay

## 2023-10-30 ENCOUNTER — Other Ambulatory Visit: Payer: Self-pay

## 2023-11-02 ENCOUNTER — Ambulatory Visit: Payer: MEDICAID | Admitting: Internal Medicine

## 2023-11-06 ENCOUNTER — Other Ambulatory Visit (HOSPITAL_COMMUNITY): Payer: Self-pay

## 2023-11-09 ENCOUNTER — Other Ambulatory Visit: Payer: Self-pay

## 2023-11-09 ENCOUNTER — Encounter: Payer: Self-pay | Admitting: Student

## 2023-11-09 ENCOUNTER — Ambulatory Visit (INDEPENDENT_AMBULATORY_CARE_PROVIDER_SITE_OTHER): Payer: MEDICAID | Admitting: Student

## 2023-11-09 ENCOUNTER — Other Ambulatory Visit (HOSPITAL_COMMUNITY): Payer: Self-pay

## 2023-11-09 ENCOUNTER — Other Ambulatory Visit: Payer: Self-pay | Admitting: Student

## 2023-11-09 DIAGNOSIS — R7303 Prediabetes: Secondary | ICD-10-CM | POA: Diagnosis not present

## 2023-11-09 DIAGNOSIS — Z23 Encounter for immunization: Secondary | ICD-10-CM

## 2023-11-09 MED ORDER — PANTOPRAZOLE SODIUM 20 MG PO TBEC
20.0000 mg | DELAYED_RELEASE_TABLET | Freq: Two times a day (BID) | ORAL | 2 refills | Status: DC
  Filled 2023-11-09: qty 60, 30d supply, fill #0
  Filled 2023-12-08 – 2023-12-13 (×2): qty 60, 30d supply, fill #1

## 2023-11-09 MED ORDER — WEGOVY 0.25 MG/0.5ML ~~LOC~~ SOAJ
0.2500 mg | SUBCUTANEOUS | 1 refills | Status: DC
Start: 2023-11-09 — End: 2023-12-21
  Filled 2023-11-09 – 2023-11-12 (×4): qty 2, 28d supply, fill #0

## 2023-11-09 NOTE — Assessment & Plan Note (Signed)
BMI 57, needs assistance with getting to appropriate BMI for bariatric surgery.  Reviewed contraindications to starting [facility].  She has been unable to tolerate several medications in the past.  May consider discontinuing metformin if she is able to start [facility] given she has had stomach upset with Ozempic in the past.  Return in 1 month for nutrition discussion.

## 2023-11-09 NOTE — Assessment & Plan Note (Addendum)
Of note, she is on metformin 500 mg twice daily which likely clouds picture of true A1c value should she not be on this medication.  A1c not obtained prior to leaving.  Check at follow-up visit.

## 2023-11-09 NOTE — Progress Notes (Signed)
    SUBJECTIVE:   Chief compliant/HPI: annual examination  Sherri [name] is a 22 y.o. who presents today for an annual exam.   Weight loss: She would also like to discuss weight loss today.  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  She has been on Ozempic in the past but did not tolerate well, would like to start a different injectable medication.  She has prediabetes although is taking metformin.  She was put on phentermine by her bariatric surgeon although has not continued taking this.  Updated history tabs and problem list.   OBJECTIVE:  BP 110/75   Pulse 91   Temp 98.3 F (36.8 C)   Ht 5\' 1"  (1.549 m)   Wt (!) 301 lb 9.6 oz (136.8 kg)   SpO2 98%   BMI 56.99 kg/m   General: Well-appearing, NAD CV: RRR, no murmurs auscultated  ASSESSMENT/PLAN:   Assessment & Plan Morbid obesity (HCC) BMI 57, needs assistance with getting to appropriate BMI for bariatric surgery.  Reviewed contraindications to starting [facility].  She has been unable to tolerate several medications in the past.  May consider discontinuing metformin if she is able to start [facility] given she has had stomach upset with Ozempic in the past.  Return in 1 month for nutrition discussion. Pre-diabetes Of note, she is on metformin 500 mg twice daily which likely clouds picture of true A1c value should she not be on this medication.  A1c not obtained prior to leaving.  Check at follow-up visit. Encounter for immunization Flu, COVID-vaccine.   Annual Examination  See AVS for age appropriate recommendations.   PHQ score not completed. Blood pressure reviewed and at goal.  The patient currently uses Nexplanon for contraception. Folate recommended as appropriate, minimum of 400 mcg per day.  Advanced directives not discussed   Considered the following items based upon USPSTF recommendations: HIV testing: declined Hepatitis C: declined Hepatitis B:  declined Syphilis if at high risk: declined GC/CT declined Lipid panel (nonfasting or fasting) discussed based upon AHA recommendations and not ordered.  Consider repeat every 4-6 years.  Reviewed risk factors for latent tuberculosis and not indicated  Discussed family history, BRCA testing not indicated.  Cervical cancer screening: not indicated as not yet age 44.  Immunizations: Flu and COVID  Shelby Mattocks, DO [facility]

## 2023-11-09 NOTE — Patient Instructions (Signed)
It was great to see you today! Thank you for choosing [facility] for your primary care.  Today we addressed: We will start you on Wegovy.  Please see below for recommendations of exercise and dietary changes.  Follow-up with me in 4 weeks for strictly nutrition conversation. Thank you for getting your flu and COVID-vaccine today.  Things to do to Keep yourself Healthy - Exercise at least 30-45 minutes a day, 3-4 days a week. >150 min of moderate intensity per week is advised. - Eat a low-fat diet with lots of fruits and vegetables, up to 7-9 servings per day. - Seatbelts can save your life. Wear them always. - Smoke detectors on every level of your home, check batteries every year. - Eye Doctor - have an eye exam every 1-2 years - Safe sex - if you may be exposed to STDs, use a condom. - Alcohol If you drink, do it moderately, less than 1 drink per day. - Health Care Power of Attorney.  Choose someone to speak for you if you are not able. - Depression is common in our stressful world.If you're feeling down or losing interest in things you normally enjoy, please come in for a visit. - Violence - If anyone is threatening or hurting you, please call immediately.   If you haven't already, sign up for My Chart to have easy access to your labs results, and communication with your primary care physician.  Return in about 4 weeks (around 12/07/2023) for Nutrition follow-up. Please arrive 15 minutes before your appointment to ensure smooth check in process.  We appreciate your efforts in making this happen.  Thank you for allowing me to participate in your care, Shelby Mattocks, DO 11/09/2023, 9:29 AM PGY-3, [facility]

## 2023-11-12 ENCOUNTER — Other Ambulatory Visit: Payer: Self-pay | Admitting: Family Medicine

## 2023-11-12 ENCOUNTER — Other Ambulatory Visit (HOSPITAL_COMMUNITY): Payer: Self-pay

## 2023-11-12 ENCOUNTER — Encounter: Payer: Self-pay | Admitting: Student

## 2023-11-12 ENCOUNTER — Telehealth (HOSPITAL_COMMUNITY): Payer: Self-pay | Admitting: Student in an Organized Health Care Education/Training Program

## 2023-11-12 ENCOUNTER — Other Ambulatory Visit: Payer: Self-pay

## 2023-11-12 DIAGNOSIS — L309 Dermatitis, unspecified: Secondary | ICD-10-CM

## 2023-11-12 DIAGNOSIS — K589 Irritable bowel syndrome without diarrhea: Secondary | ICD-10-CM

## 2023-11-12 NOTE — Telephone Encounter (Signed)
Spoke with patient, she is having issues with getting her Zoloft and Abilify. Let her know that we will try to have a new rx  ordered by an attending provider in the next 24-48hrs.   PGY-4  Eliseo Gum, MD

## 2023-11-13 ENCOUNTER — Other Ambulatory Visit (HOSPITAL_COMMUNITY): Payer: Self-pay | Admitting: Psychiatry

## 2023-11-13 ENCOUNTER — Encounter (HOSPITAL_COMMUNITY): Payer: Self-pay

## 2023-11-13 ENCOUNTER — Other Ambulatory Visit (HOSPITAL_COMMUNITY): Payer: Self-pay

## 2023-11-13 ENCOUNTER — Other Ambulatory Visit: Payer: Self-pay

## 2023-11-13 DIAGNOSIS — F4310 Post-traumatic stress disorder, unspecified: Secondary | ICD-10-CM

## 2023-11-13 DIAGNOSIS — F333 Major depressive disorder, recurrent, severe with psychotic symptoms: Secondary | ICD-10-CM

## 2023-11-13 DIAGNOSIS — F411 Generalized anxiety disorder: Secondary | ICD-10-CM

## 2023-11-13 MED ORDER — SERTRALINE HCL 100 MG PO TABS
100.0000 mg | ORAL_TABLET | Freq: Every day | ORAL | 0 refills | Status: DC
  Filled 2023-11-13 – 2023-12-13 (×3): qty 30, 30d supply, fill #0

## 2023-11-13 MED ORDER — ARIPIPRAZOLE 5 MG PO TABS
5.0000 mg | ORAL_TABLET | Freq: Every day | ORAL | 0 refills | Status: DC
  Filled 2023-11-13 – 2023-12-13 (×3): qty 30, 30d supply, fill #0

## 2023-11-13 MED ORDER — DICYCLOMINE HCL 10 MG PO CAPS
10.0000 mg | ORAL_CAPSULE | Freq: Two times a day (BID) | ORAL | 0 refills | Status: AC | PRN
  Filled 2023-11-13: qty 60, 30d supply, fill #0

## 2023-11-13 MED ORDER — TRAZODONE HCL 50 MG PO TABS
25.0000 mg | ORAL_TABLET | Freq: Every day | ORAL | 0 refills | Status: DC
  Filled 2023-11-13: qty 30, 60d supply, fill #0

## 2023-11-13 MED ORDER — TRIAMCINOLONE ACETONIDE 0.1 % EX CREA
1.0000 | TOPICAL_CREAM | Freq: Two times a day (BID) | CUTANEOUS | 1 refills | Status: AC
  Filled 2023-11-13 – 2024-06-17 (×4): qty 454, 30d supply, fill #0

## 2023-11-14 ENCOUNTER — Other Ambulatory Visit (HOSPITAL_COMMUNITY): Payer: Self-pay

## 2023-11-14 ENCOUNTER — Other Ambulatory Visit: Payer: Self-pay

## 2023-11-14 ENCOUNTER — Telehealth: Payer: Self-pay

## 2023-11-14 NOTE — Telephone Encounter (Signed)
Pharmacy Patient Advocate Encounter   Received notification from CoverMyMeds that prior authorization for [facility] is required/requested.    The patient is insured through [facility] .   PA required; PA submitted to above mentioned insurance via CoverMyMeds Key/confirmation #/[id number]. Status is pending

## 2023-11-16 NOTE — Telephone Encounter (Signed)
Per insurance, are any of the following recently documented? This will be needed for the [facility] PA:

## 2023-11-18 ENCOUNTER — Encounter: Payer: Self-pay | Admitting: Student

## 2023-11-19 ENCOUNTER — Encounter: Payer: Self-pay | Admitting: Student

## 2023-11-20 ENCOUNTER — Other Ambulatory Visit (HOSPITAL_COMMUNITY): Payer: Self-pay

## 2023-11-21 ENCOUNTER — Other Ambulatory Visit (HOSPITAL_COMMUNITY): Payer: Self-pay

## 2023-11-21 NOTE — Telephone Encounter (Signed)
 Pharmacy Patient Advocate Encounter  Received notification from [facility] that Prior Authorization for [facility] has been DENIED.  Full denial letter will be uploaded to the media tab. See denial reason below.

## 2023-11-23 ENCOUNTER — Other Ambulatory Visit (HOSPITAL_COMMUNITY): Payer: Self-pay

## 2023-11-23 ENCOUNTER — Telehealth (HOSPITAL_COMMUNITY): Payer: MEDICAID | Admitting: Student in an Organized Health Care Education/Training Program

## 2023-11-26 ENCOUNTER — Other Ambulatory Visit (HOSPITAL_COMMUNITY): Payer: Self-pay

## 2023-12-08 ENCOUNTER — Other Ambulatory Visit (HOSPITAL_COMMUNITY): Payer: Self-pay

## 2023-12-10 ENCOUNTER — Other Ambulatory Visit (HOSPITAL_BASED_OUTPATIENT_CLINIC_OR_DEPARTMENT_OTHER): Payer: Self-pay

## 2023-12-10 ENCOUNTER — Other Ambulatory Visit (HOSPITAL_COMMUNITY): Payer: Self-pay

## 2023-12-13 ENCOUNTER — Other Ambulatory Visit (HOSPITAL_COMMUNITY): Payer: Self-pay

## 2023-12-13 ENCOUNTER — Ambulatory Visit: Payer: MEDICAID | Admitting: Student

## 2023-12-14 ENCOUNTER — Other Ambulatory Visit: Payer: Self-pay

## 2023-12-14 ENCOUNTER — Other Ambulatory Visit: Payer: Self-pay | Admitting: Student

## 2023-12-14 ENCOUNTER — Other Ambulatory Visit (HOSPITAL_COMMUNITY): Payer: Self-pay

## 2023-12-14 ENCOUNTER — Telehealth (INDEPENDENT_AMBULATORY_CARE_PROVIDER_SITE_OTHER): Payer: MEDICAID | Admitting: Student in an Organized Health Care Education/Training Program

## 2023-12-14 ENCOUNTER — Other Ambulatory Visit (HOSPITAL_COMMUNITY): Payer: Self-pay | Admitting: Psychiatry

## 2023-12-14 ENCOUNTER — Encounter (HOSPITAL_COMMUNITY): Payer: Self-pay | Admitting: Student in an Organized Health Care Education/Training Program

## 2023-12-14 DIAGNOSIS — F339 Major depressive disorder, recurrent, unspecified: Secondary | ICD-10-CM

## 2023-12-14 DIAGNOSIS — F3340 Major depressive disorder, recurrent, in remission, unspecified: Secondary | ICD-10-CM

## 2023-12-14 DIAGNOSIS — F4310 Post-traumatic stress disorder, unspecified: Secondary | ICD-10-CM

## 2023-12-14 DIAGNOSIS — F315 Bipolar disorder, current episode depressed, severe, with psychotic features: Secondary | ICD-10-CM

## 2023-12-14 DIAGNOSIS — F411 Generalized anxiety disorder: Secondary | ICD-10-CM

## 2023-12-14 DIAGNOSIS — G4709 Other insomnia: Secondary | ICD-10-CM

## 2023-12-14 DIAGNOSIS — F330 Major depressive disorder, recurrent, mild: Secondary | ICD-10-CM

## 2023-12-14 DIAGNOSIS — F333 Major depressive disorder, recurrent, severe with psychotic symptoms: Secondary | ICD-10-CM

## 2023-12-14 MED ORDER — ARIPIPRAZOLE 5 MG PO TABS
5.0000 mg | ORAL_TABLET | Freq: Every day | ORAL | 1 refills | Status: DC
  Filled 2023-12-14: qty 30, 30d supply, fill #0
  Filled 2024-01-09: qty 30, 30d supply, fill #1

## 2023-12-14 MED ORDER — TRAZODONE HCL 50 MG PO TABS
25.0000 mg | ORAL_TABLET | Freq: Every day | ORAL | 1 refills | Status: DC
  Filled 2023-12-14: qty 30, 60d supply, fill #0

## 2023-12-14 MED ORDER — PANTOPRAZOLE SODIUM 20 MG PO TBEC
20.0000 mg | DELAYED_RELEASE_TABLET | Freq: Two times a day (BID) | ORAL | 2 refills | Status: DC
  Filled 2024-01-09: qty 60, 30d supply, fill #0
  Filled 2024-02-05: qty 60, 30d supply, fill #1
  Filled 2024-02-06: qty 60, 30d supply, fill #0
  Filled 2024-04-16: qty 60, 30d supply, fill #1

## 2023-12-14 MED ORDER — HYDROXYZINE HCL 25 MG PO TABS
25.0000 mg | ORAL_TABLET | Freq: Three times a day (TID) | ORAL | 1 refills | Status: DC | PRN
  Filled 2023-12-14: qty 90, 30d supply, fill #0

## 2023-12-14 MED ORDER — MELATONIN 3 MG PO TABS
3.0000 mg | ORAL_TABLET | Freq: Every day | ORAL | 1 refills | Status: DC
  Filled 2023-12-14: qty 60, 60d supply, fill #0

## 2023-12-14 MED ORDER — SERTRALINE HCL 100 MG PO TABS
150.0000 mg | ORAL_TABLET | Freq: Every day | ORAL | 1 refills | Status: DC
  Filled 2023-12-14: qty 45, 30d supply, fill #0
  Filled 2024-01-09: qty 45, 30d supply, fill #1

## 2023-12-14 NOTE — Progress Notes (Signed)
Virtual Visit via Video Note  I connected with Sherri [name] on 12/14/23 at 11:00 AM EST by a video enabled telemedicine application and verified that I am speaking with the correct person using two identifiers.  Location: Patient: Home Provider: Office   I discussed the limitations of evaluation and management by telemedicine and the availability of in person appointments. The patient expressed understanding and agreed to proceed.    I discussed the assessment and treatment plan with the patient. The patient was provided an opportunity to ask questions and all were answered. The patient agreed with the plan and demonstrated an understanding of the instructions.   The patient was advised to call back or seek an in-person evaluation if the symptoms worsen or if the condition fails to improve as anticipated.  I provided 25 minutes of non-face-to-face time during this encounter.   [name], MD [facility] MD/PA/NP OP Progress Note  12/14/2023 12:43 PM Sherri [name]  MRN:  [id number]  Chief Complaint:  Chief Complaint  Patient presents with   Follow-up   HPI:  Sherri [name] is a 22 year old female seen today for follow-up psychiatric evaluation. She has a psychiatric history of bipolar disorder, SI, anxiety and depression.  Patient reports she has been compliant with the following medications   Abilify 5mg  daily Zoloft 100 mg daily Trazodone 25mg  at bedtime PRN Melatonin 3mg  at bedtime PRN Klonopin 1mg  QHS, rx Dr. Dina Rich at [facility] Nexplanon Vit D- daily Metformin 500mg  BID  Mom is in the room as well.  Patient reports that she is "ok." Patient reports that her tics are worse, but she thinks that this is because her klonopin has been half-dosed by pharmacy.  Patient reports that she feels her tics are more controlled on the 1mg  klonopin. Patient reports she believes that her anxiety has been better overall better despite this. She has noticed that her tics get worse  when she is more anxious. Patient reports she has noticed being around large numbers of people are triggers, like the grocery store. Patient is not sure why she has this fear of large numbers of people, denying that she has a general fear of being judged. Patient tries deep breathing to calm herself down to distract herself with crafts if she can. Patient reports that she is able to do task alone in these environments but she has really bad motor tics. Patient reports that she is sleeping well averaging 8hrs/night. Patient denies feeling down, depressed, and hopeless. Patient reports that her appetite is stable. Patient reports that she drinks 1 cup of soda with dinner. Patinet continues to do hair and is enjoying this. Patient is thinking that she will go back to school when she feels her tics are under better control. Patinet denies SI, HI, and AVH. Patient reports that if she misses a dose she may have hypnagogic and hypnopompic hallucinations, but she has not had any AH like 1 year ago.   Visit Diagnosis:    ICD-10-CM   1. PTSD (post-traumatic stress disorder)  F43.10     2. Severe episode of recurrent major depressive disorder, with psychotic features (HCC)  F33.3     3. Generalized anxiety disorder  F41.1     4. Bipolar disorder, current episode depressed, severe, with psychotic features (HCC)  F31.5         Past Psychiatric History: Bipolar disorder ? (Unsure of who dx her with this or when), SI, anxiety and depression  05/2023- EKG  QTC: 413 2020- started having AVH, notes that it started right after her father died in 2017-12-30. Things only got worse during Covid, she stopped going to school even online.    11/2022-started patient on risperidone 0.5 mg twice daily due to patient endorsing stealing things and feeling more impulsive without being on risperidone.   03/2023- having nipple discharge on risperdal, dc'd risperdal. Concern that patient's  anxiety and related to Avoidant PD traits,    05/2023-pattern of depressive symptoms that coincide with the sudden onset of AVH then get better when her depression resolves . Started abilify 2mg . Continues to endorse multiple PTSD symptoms. Increased Zoloft to 100mg . Continued Trazodone 25mg  w. OTC melatonin  07/2023-Appears to be doing fairly well as she is endorsing significant decrease in her PTSD symptoms however her tics appear to be associated with hyperarousal. No med adjustments made  09/2023-Endorsing mild AVH. Will increase Abilify to 5mg . Also endorsd a stressor of sister moving which coincided with feeing that tics suddenly worsened. Has a therapist: Percell Miller, @ [facility] since11/2024  Past Medical History:  Past Medical History:  Diagnosis Date    Chronic pain 02/02/2020   Abnormal vision screen 08/26/2015   Acanthosis nigricans 02/26/2020   Acid reflux    Allergy    Seasonal allergies   Anxiety    Phreesia 11/24/2020   Asthma    Atopic dermatitis 07/14/2013   Carpal tunnel syndrome, bilateral 11/21/2018   S/p bilat surgery, release   Chest pain 07/16/2019   Concussion 02/16/2021   Approx 2/15 d/t hit posterior skull against wall during tic.    Constipation 06/17/2013   Cough 01/29/2021   COVID-19 01/22/2020   Eczema    Elevated blood pressure reading 02/26/2020   Gallstones    GERD (gastroesophageal reflux disease)    Phreesia 11/24/2020   Headache    Obesity    Pain of both hip joints 11/10/2019   PCOS (polycystic ovarian syndrome) 02/26/2020   Recurrent viral infection 05/15/2022   Seasonal allergies    Tic    Toothache 11/06/2022    Past Surgical History:  Procedure Laterality Date   BILATERAL CARPAL TUNNEL RELEASE     CHOLECYSTECTOMY N/A 08/08/2017   Procedure: LAPAROSCOPIC CHOLECYSTECTOMY;  Surgeon: Kandice Hams, MD;  Location: [facility] OR;  Service: General;  Laterality: N/A;   UPPER GASTROINTESTINAL ENDOSCOPY      Family Psychiatric History: Mother depression and OCD, brother  depression, Sister depression, 2 brother learning disability, a maternal aunt depression   Family History:  Family History  Problem Relation Age of Onset   Asthma Mother    Arthritis Mother    Depression Mother    Diabetes Mother    Hypertension Mother    Obesity Mother    Irritable bowel syndrome Mother    Asthma Sister    Learning disabilities Sister    Stroke Sister    Asthma Brother    Depression Brother    Learning disabilities Brother    Irritable bowel syndrome Brother    Depression Maternal Aunt    Diabetes Maternal Aunt    Mental illness Maternal Aunt    Diabetes Maternal Uncle    Kidney disease Maternal Uncle    Arthritis Maternal Grandmother    Diabetes Maternal Grandmother    Heart disease Maternal Grandmother    Kidney disease Maternal Grandmother    Hypertension Maternal Grandmother    Hyperlipidemia Maternal Grandmother    Cervical cancer Maternal Grandmother    Stroke Maternal Grandfather  Cancer Other    COPD Other    Heart disease Other    Hypertension Other    Obesity Father    Heart attack Father    Heart disease Paternal Grandmother    Hypertension Paternal Grandmother    Colon cancer Neg Hx    Esophageal cancer Neg Hx    Pancreatic cancer Neg Hx    Stomach cancer Neg Hx     Social History:  Social History   Socioeconomic History   Marital status: Single    Spouse name: Not on file   Number of children: 0   Years of education: 12   Highest education level: GED or equivalent  Occupational History   Occupation: Furniture conservator/restorer to college   Occupation: Seeking disability  Tobacco Use   Smoking status: Never    Passive exposure: Yes   Smokeless tobacco: Never   Tobacco comments:    mom stopped smoking  Vaping Use   Vaping status: Never Used  Substance and Sexual Activity   Alcohol use: No   Drug use: No   Sexual activity: Never  Other Topics Concern   Not on file  Social History Narrative   Lives with Mom and some siblings, some of  whom have children of their own, dropped out of [employer]. plans to start [employer] for GED.   Right-handed.   No daily use of caffeine.    Social Drivers of Corporate investment banker Strain: Low Risk  (03/26/2023)   Received from [facility], [facility]   Overall Financial Resource Strain (CARDIA)    Difficulty of Paying Living Expenses: Not hard at all  Food Insecurity: No Food Insecurity (03/26/2023)   Received from [facility], [facility]   Hunger Vital Sign    Worried About Running Out of Food in the Last Year: Never true    Ran Out of Food in the Last Year: Never true  Transportation Needs: Patient Unable To Answer (03/26/2023)   Received from [facility]   [facility] - Transportation    Lack of Transportation (Medical): Patient unable to answer    Lack of Transportation (Non-Medical): Patient unable to answer  Physical Activity: Not on file  Stress: Not on file  Social Connections: Unknown (03/07/2023)   Received from [facility], [facility]   Social Network    Social Network: Not on file    Allergies:  Allergies  Allergen Reactions   Ibuprofen Other (See Comments)    Chest pain   Shellfish Allergy Hives and Itching    Mouth itches, facial hives.     Metabolic Disorder Labs: Lab Results  Component Value Date   HGBA1C 5.9 (H) 03/01/2023   MPG 123 02/11/2020   MPG 126 07/18/2019   Lab Results  Component Value Date   PROLACTIN 7.0 05/25/2023   PROLACTIN 12.2 07/18/2019   Lab Results  Component Value Date   CHOL 176 03/01/2023   TRIG 50 03/01/2023   HDL 73 03/01/2023   CHOLHDL 2.4 03/01/2023   VLDL 16 07/25/2013   LDLCALC 93 03/01/2023   LDLCALC 79 01/27/2021   Lab Results  Component Value Date   TSH 0.450 05/25/2023   TSH 0.820 01/11/2023    Therapeutic Level Labs: No results found for: "LITHIUM" No results found for: "VALPROATE" No results found for: "CBMZ"  Current Medications: Current Outpatient Medications  Medication Sig  Dispense Refill   albuterol (PROVENTIL) (2.5 MG/3ML) 0.083% nebulizer solution Take 3 mLs (2.5 mg total) by nebulization  every 6 (six) hours as needed for wheezing or shortness of breath. 75 mL 12   albuterol (VENTOLIN HFA) 108 (90 Base) MCG/ACT inhaler Inhale 2 puffs into the lungs every 6 (six) hours as needed for wheezing or shortness of breath. 18 g 4   budesonide-formoterol (SYMBICORT) 160-4.5 MCG/ACT inhaler Inhale 2 puffs into the lungs 2 (two) times daily. 10.2 g 3   cholecalciferol (VITAMIN D3) 25 MCG (1000 UNIT) tablet Take 1 tablet (1,000 Units total) by mouth daily. Return for vit D recheck when this bottle runs out. 90 tablet 0   clonazePAM (KLONOPIN) 0.5 MG tablet Take 1 tablet (0.5 mg total) by mouth Nightly. 30 tablet 3   clonazePAM (KLONOPIN) 0.5 MG tablet Take 1 tablet (0.5 mg total) by mouth at bedtime. 30 tablet 3   clonazePAM (KLONOPIN) 0.5 MG tablet Take 2 tablets (1 mg total) by mouth nightly 30 tablet 3   dicyclomine (BENTYL) 10 MG capsule Take 1 capsule (10 mg total) by mouth 2 (two) times daily as needed for spasms. 60 capsule 0   etonogestrel (NEXPLANON) 68 MG IMPL implant 1 each (68 mg total) by Subdermal route once. 1 each 0   famotidine (PEPCID) 20 MG tablet Take 1 tablet (20 mg total) by mouth 2 (two) times daily. 180 tablet 1   linaclotide (LINZESS) 72 MCG capsule Take 1 capsule (72 mcg total) by mouth daily before breakfast. 90 capsule 3   metFORMIN (GLUCOPHAGE) 500 MG tablet Take 1 tablet (500 mg total) by mouth 2 (two) times daily with a meal. 180 tablet 3   montelukast (SINGULAIR) 10 MG tablet Take 1 tablet (10 mg total) by mouth at bedtime. 90 tablet 3   pantoprazole (PROTONIX) 20 MG tablet Take 1 tablet (20 mg total) by mouth 2 (two) times daily. 60 tablet 2   Spacer/Aero Chamber Mouthpiece MISC 1 Device by Does not apply route as needed. 1 each 0   ARIPiprazole (ABILIFY) 5 MG tablet Take 1 tablet (5 mg total) by mouth daily. 30 tablet 1   hydrOXYzine (ATARAX)  25 MG tablet Take 1 tablet (25 mg total) by mouth 3 (three) times daily as needed. 90 tablet 1   melatonin 3 MG TABS tablet Take 1 tablet (3 mg total) by mouth at bedtime. 30 tablet 1   Semaglutide-Weight Management (WEGOVY) 0.25 MG/0.5ML SOAJ Inject 0.25 mg into the skin once a week. (Patient not taking: Reported on 12/14/2023) 2 mL 1   sertraline (ZOLOFT) 100 MG tablet Take 1.5 tablets (150 mg total) by mouth daily. 45 tablet 1   traZODone (DESYREL) 50 MG tablet Take 1/2 tablet (25 mg total) by mouth at bedtime. 30 tablet 1   triamcinolone cream in Minerin Creme Apply 1 application topically 2 (two) times daily. Do not use for more than 7 consecutive days (Patient not taking: Reported on 12/14/2023) 454 g 1   No current facility-administered medications for this visit.     Psychiatric Specialty Exam: Review of Systems  Psychiatric/Behavioral:  Negative for dysphoric mood, hallucinations, sleep disturbance and suicidal ideas. The patient is not nervous/anxious.     There were no vitals taken for this visit.There is no height or weight on file to calculate BMI.  General Appearance: Fairly Groomed  Eye Contact:  Good  Speech:  Clear and Coherent  Volume:  Normal  Mood:  Euthymic  Affect:  Appropriate  Thought Process:  Coherent  Orientation:  Full (Time, Place, and Person)  Thought Content: Logical   Suicidal Thoughts:  No  Homicidal Thoughts:  No  Memory:  Immediate;   Good Recent;   Good  Judgement:  Good  Insight:  Good  Psychomotor Activity:   Tic like movements in neck and arm  Concentration:  Concentration: Good  Recall:  Good  Fund of Knowledge: Good  Language: Good  Akathisia:  No  Handed:    AIMS (if indicated): done  Assets:  Communication Skills Desire for Improvement Housing Leisure Time Resilience Social Support Transportation  ADL's:  Intact  Cognition: WNL  Sleep:  Good   Screenings: AIMS    Flowsheet Row Clinical Support from 07/27/2023 in [facility]  AIMS Total Score 4      GAD-7    Flowsheet Row Video Visit from 09/06/2022 in [facility] Clinical Support from 01/30/2022 in [facility] Office Visit from 11/03/2021 in [facility] Office Visit from 06/08/2020 [facility] Video Visit from 02/10/2020 in [facility]  Total GAD-7 Score 13 19 18 12 21       PHQ2-9    Flowsheet Row Office Visit from 05/25/2023 in [facility] Dept Of [place]. [facility] Most recent reading at 05/25/2023  1:50 PM Office Visit from 03/30/2023 in [facility] - A Dept Of [place]. [facility] Most recent reading at 03/30/2023  1:19 PM Office Visit from 03/30/2023 in [facility] - A Dept Of Eligha Bridegroom. [facility] Most recent reading at 03/30/2023 10:56 AM Office Visit from 03/01/2023 in [facility] - A Dept Of [place]. [facility] Most recent reading at 03/01/2023 11:01 AM Office Visit from 01/23/2023 in [facility] - A Dept Of [facility]ideg[facility]. [facility] Most recent reading at 01/23/2023  9:46 AM  PHQ-2 Total Score 5 1 1 2 4   PHQ-9 Total Score 19 7 8 10 14       Flowsheet Row ED from 02/04/2023 in [facility] Emergency Department at [facility] Video Visit from 06/05/2022 in [facility] ED from 05/01/2022 in [facility] RISK CATEGORY No Risk Error: Q7 should not be populated when Q6 is No No Risk        Assessment and Plan: Patient appears to have improving insight as she is able to make the connection that her tics worsen with anxiety. Will increase patient's Zoloft to address underlying anxiety and PTSD dx, where she endorses continued hypervigilance like  symptoms as well. Will also start Hydroxyzine PRN, patient will attempt to take medication before going to places like the grocery store and monitor for improvements in tic severity in these environments. Patient continues to do well in others with less dysphoric mood and more goal-oriented.  Patient has started seeing a new therapist as well.   GAD PTSD Hx of MDD,severe w/ psychotic features MDD in remission - Continue Abilify to 5mg  daily - Increase Zoloft 150mg  - Continue Trazodone 25mg  w/ OTC melatonin for sleep PRN - Start Hydroxyzine 25mg  TID PRN - Continue Melatonin 3mg  QHS  Tic/ movement d/o of etiology likely psychiatrically related - reoccurring, see Dr. Dina Rich notes @[facility]  Neurology  Collaboration of Care: Collaboration of Care:   Patient/Guardian was advised Release of Information must be  obtained prior to any record release in order to collaborate their care with an outside provider. Patient/Guardian was advised if they have not already done so to contact the registration department to sign all necessary forms in order for [place] to release information regarding their care.   Consent: Patient/Guardian gives verbal consent for treatment and assignment of benefits for services provided during this visit. Patient/Guardian expressed understanding and agreed to proceed.   PGY-4 [name], MD 12/14/2023, 12:43 PM

## 2023-12-18 ENCOUNTER — Encounter (HOSPITAL_BASED_OUTPATIENT_CLINIC_OR_DEPARTMENT_OTHER): Payer: Self-pay

## 2023-12-18 ENCOUNTER — Other Ambulatory Visit (HOSPITAL_BASED_OUTPATIENT_CLINIC_OR_DEPARTMENT_OTHER): Payer: Self-pay

## 2023-12-20 ENCOUNTER — Other Ambulatory Visit (HOSPITAL_COMMUNITY): Payer: Self-pay

## 2023-12-21 ENCOUNTER — Other Ambulatory Visit: Payer: Self-pay

## 2023-12-21 ENCOUNTER — Ambulatory Visit (INDEPENDENT_AMBULATORY_CARE_PROVIDER_SITE_OTHER): Payer: MEDICAID | Admitting: Student

## 2023-12-21 DIAGNOSIS — R7303 Prediabetes: Secondary | ICD-10-CM

## 2023-12-21 LAB — POCT GLYCOSYLATED HEMOGLOBIN (HGB A1C): Hemoglobin A1C: 5.8 % — AB (ref 4.0–5.6)

## 2023-12-21 NOTE — Assessment & Plan Note (Signed)
 Rechecked A1c, 5.8.  Still in prediabetic range.  No changes to management.

## 2023-12-21 NOTE — Progress Notes (Signed)
  SUBJECTIVE:   CHIEF COMPLAINT / HPI:   Nutrition: She would like to get to 240-250lbs to be a candidate for bariatric surgery. In the past she has tried phentermine  and ozempic . She was able to go from 280lbs>250lbs on phentermine .   PERTINENT  PMH / PSH: Asthma, prediabetes, morbid obesity, GERD, bipolar disorder  OBJECTIVE:  BP 132/80   Pulse 86   Wt (!) 316 lb 12.8 oz (143.7 kg)   SpO2 99%   BMI 59.86 kg/m  Gen: well-appearing, NAD Psych: Calm, normal mood and affect  ASSESSMENT/PLAN:   Assessment & Plan Morbid obesity (HCC) BMI 59, unfortunately Wegovy  was not covered.  She remains on metformin .  Partially adherent to plan by bariatric surgery regarding diet however she is not tracking her intake nor exercising consistently.  We have agreed to send her to healthy weight and wellness for further intervention.  They would likely provide the best chance at assisting with lifestyle changes in addition to GLP-1 medication plan. Pre-diabetes Rechecked A1c, 5.8.  Still in prediabetic range.  No changes to management. No follow-ups on file. Kieth Johnson, DO 12/21/2023, 2:06 PM PGY-3, [facility]

## 2023-12-21 NOTE — Patient Instructions (Signed)
 It was great to see you today! Thank you for choosing [facility] for your primary care.  Today we addressed: I have referred you to healthy weight and wellness.  They will be able to perform diagnostic tests to assess your nutritional requirements and help with starting weight loss medications.  If you haven't already, sign up for My Chart to have easy access to your labs results, and communication with your primary care physician.  Please arrive 15 minutes before your appointment to ensure smooth check in process.  We appreciate your efforts in making this happen.  Thank you for allowing me to participate in your care, Kieth Johnson, DO 12/21/2023, 2:05 PM PGY-3, [facility]

## 2023-12-21 NOTE — Assessment & Plan Note (Addendum)
 BMI 59, unfortunately Wegovy  was not covered.  She remains on metformin .  Partially adherent to plan by bariatric surgery regarding diet however she is not tracking her intake nor exercising consistently.  We have agreed to send her to healthy weight and wellness for further intervention.  They would likely provide the best chance at assisting with lifestyle changes in addition to GLP-1 medication plan.

## 2023-12-24 ENCOUNTER — Ambulatory Visit: Payer: MEDICAID | Attending: Internal Medicine | Admitting: Internal Medicine

## 2023-12-24 NOTE — Progress Notes (Deleted)
  Cardiology Office Note:  .   Date:  12/24/2023  ID:  Sherri [name], DOB 2001/10/11, MRN [id number] PCP: Sherri Pond, DO  [facility] Providers Cardiologist:  None { Click to update primary MD,subspecialty MD or APP then REFRESH:1}   History of Present Illness: .   Sherri [name] is a 23 y.o. female with hx of depression, bipolar, morbid obesity BMI 59 being referred for chest pain.  ROS: ***  Studies Reviewed: .        *** Risk Assessment/Calculations:   {Does this patient have ATRIAL FIBRILLATION?[phone]} No BP recorded.  {Refresh Note OR Click here to enter BP  :1}***       Physical Exam:   VS:  There were no vitals taken for this visit.   Wt Readings from Last 3 Encounters:  12/21/23 (!) 316 lb 12.8 oz (143.7 kg)  11/09/23 (!) 301 lb 9.6 oz (136.8 kg)  05/25/23 268 lb (121.6 kg)    GEN: Well nourished, well developed in no acute distress NECK: No JVD; No carotid bruits CARDIAC: ***RRR, no murmurs, rubs, gallops RESPIRATORY:  Clear to auscultation without rales, wheezing or rhonchi  ABDOMEN: Soft, non-tender, non-distended EXTREMITIES:  No edema; No deformity   ASSESSMENT AND PLAN: .   ***    {Are you ordering a CV Procedure (e.g. stress test, cath, DCCV, TEE, etc)?   Press F2        :[id number]}  Dispo: ***  Signed, Sherri Ronal BRAVO, MD

## 2023-12-26 ENCOUNTER — Other Ambulatory Visit: Payer: Self-pay | Admitting: Student

## 2023-12-26 ENCOUNTER — Other Ambulatory Visit (HOSPITAL_COMMUNITY): Payer: Self-pay

## 2023-12-26 ENCOUNTER — Encounter: Payer: Self-pay | Admitting: Family Medicine

## 2023-12-26 DIAGNOSIS — J4540 Moderate persistent asthma, uncomplicated: Secondary | ICD-10-CM

## 2023-12-26 DIAGNOSIS — J4530 Mild persistent asthma, uncomplicated: Secondary | ICD-10-CM

## 2023-12-26 MED ORDER — ALBUTEROL SULFATE HFA 108 (90 BASE) MCG/ACT IN AERS
2.0000 | INHALATION_SPRAY | Freq: Four times a day (QID) | RESPIRATORY_TRACT | 2 refills | Status: DC | PRN
  Filled 2023-12-26 (×2): qty 18, 25d supply, fill #0
  Filled 2024-01-24: qty 18, 25d supply, fill #1

## 2023-12-28 ENCOUNTER — Encounter: Payer: Self-pay | Admitting: Student

## 2024-01-03 ENCOUNTER — Other Ambulatory Visit (HOSPITAL_COMMUNITY): Payer: Self-pay

## 2024-01-04 ENCOUNTER — Other Ambulatory Visit (HOSPITAL_COMMUNITY): Payer: Self-pay

## 2024-01-08 IMAGING — DX DG ABDOMEN 1V
2 series · 2 of 2 positions shown · non-contrast
Comparison: 05/02/2019

CLINICAL DATA: Abdominal pain, nausea, vomiting

EXAM:
ABDOMEN - 1 VIEW

[abdomen kub (1 of 2)]
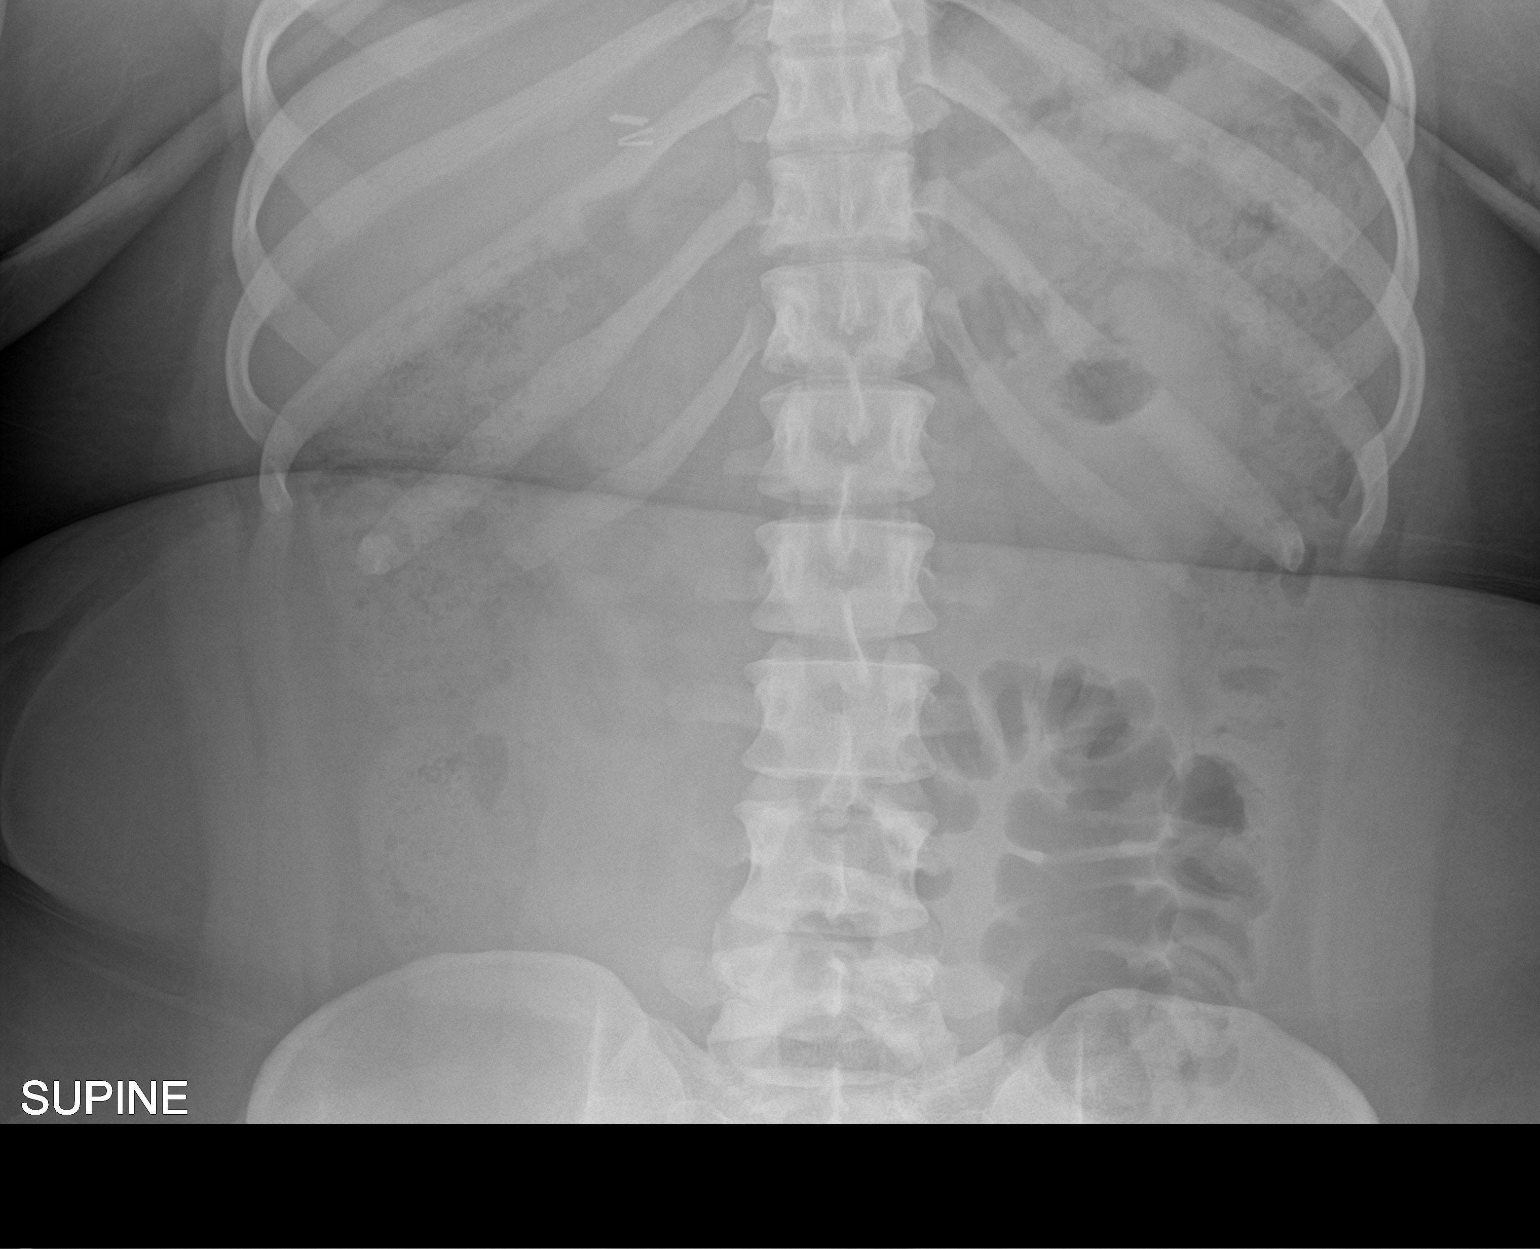

[abdomen kub (2 of 2)]
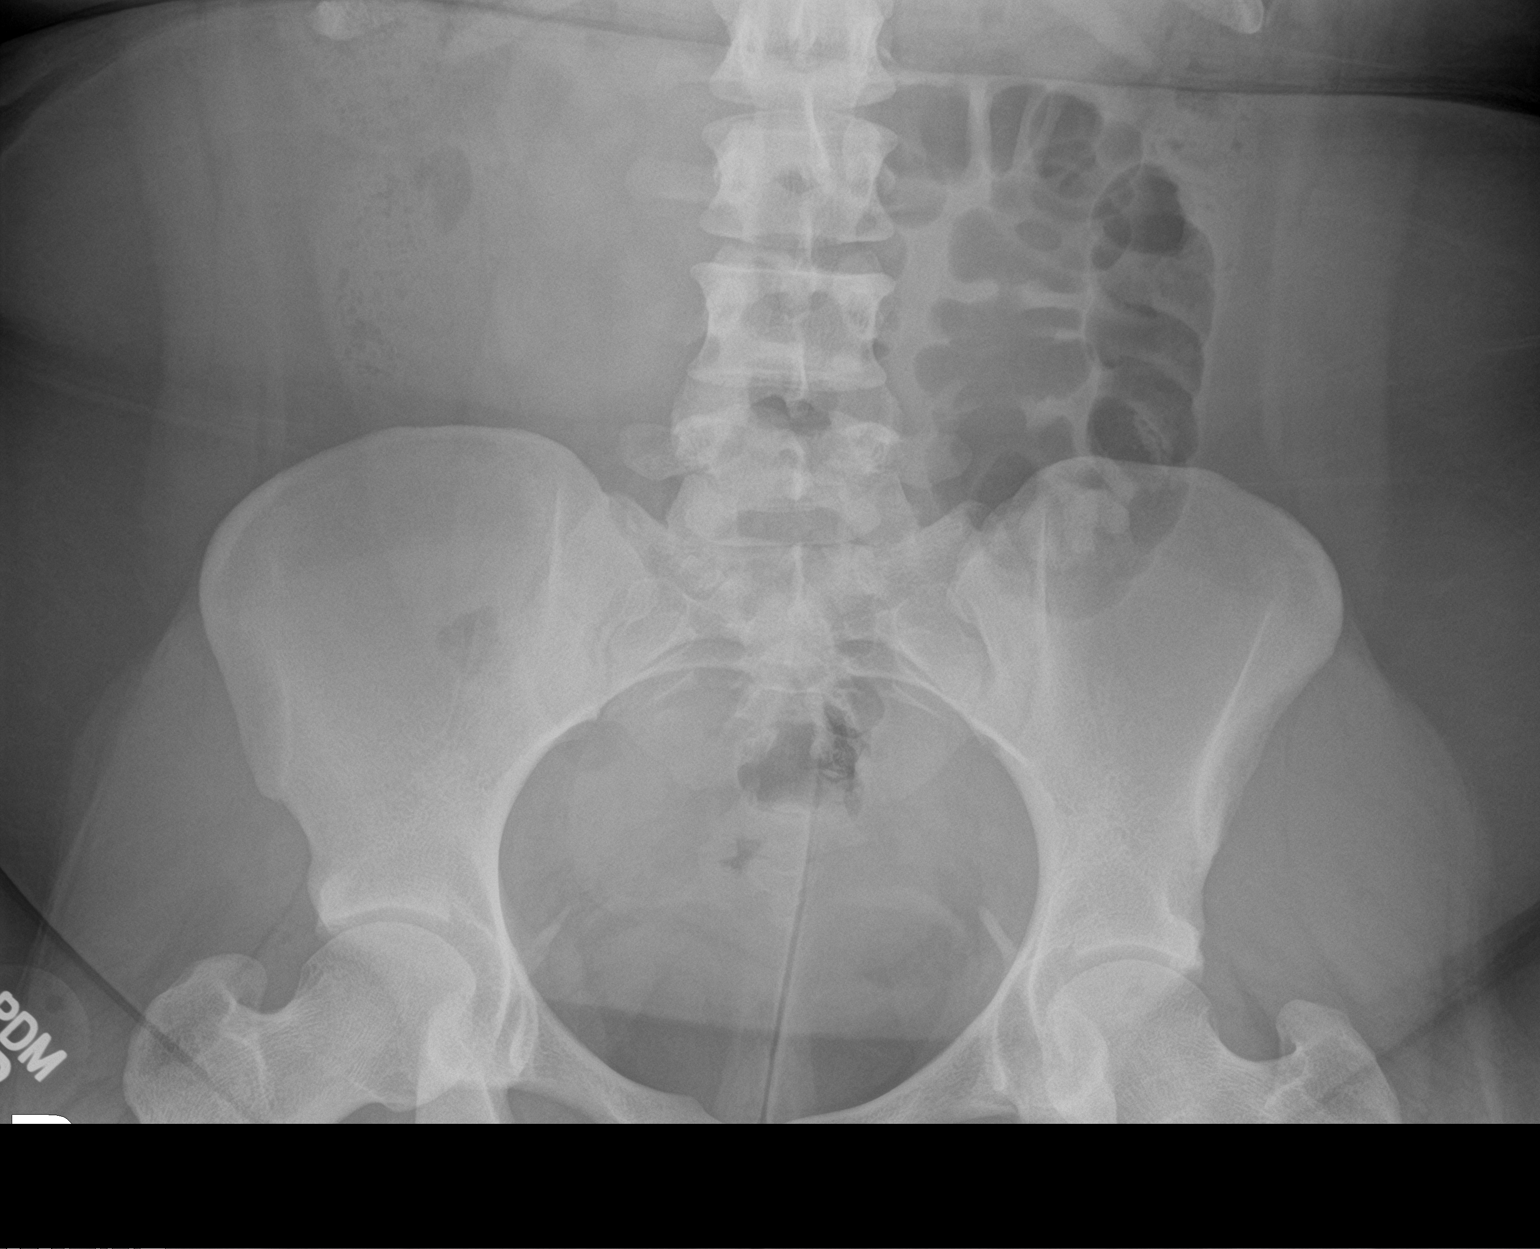

[2 of 2 positions shown; findings below may reference images not displayed]

FINDINGS: Bowel gas pattern is nonspecific. There is moderate amount of stool
in the ascending and proximal descending colon. No abnormal masses
or calcifications are seen. Kidneys are partly obscured by bowel
contents. Surgical clips are seen in the right upper quadrant.
IMPRESSION: Nonspecific bowel gas pattern. Moderate amount of stool is seen in
the ascending and proximal descending colon without fecal impaction
in the rectosigmoid.

## 2024-01-09 ENCOUNTER — Other Ambulatory Visit: Payer: Self-pay | Admitting: Family Medicine

## 2024-01-09 ENCOUNTER — Other Ambulatory Visit (HOSPITAL_COMMUNITY): Payer: Self-pay

## 2024-01-09 DIAGNOSIS — J4530 Mild persistent asthma, uncomplicated: Secondary | ICD-10-CM

## 2024-01-10 ENCOUNTER — Other Ambulatory Visit: Payer: Self-pay

## 2024-01-10 ENCOUNTER — Other Ambulatory Visit (HOSPITAL_COMMUNITY): Payer: Self-pay

## 2024-01-10 ENCOUNTER — Encounter (HOSPITAL_COMMUNITY): Payer: Self-pay

## 2024-01-10 MED ORDER — CLONAZEPAM 0.5 MG PO TABS
1.0000 mg | ORAL_TABLET | Freq: Every day | ORAL | 3 refills | Status: DC
  Filled 2024-02-05: qty 30, 15d supply, fill #0

## 2024-01-16 ENCOUNTER — Other Ambulatory Visit (HOSPITAL_COMMUNITY): Payer: Self-pay

## 2024-01-18 ENCOUNTER — Other Ambulatory Visit (HOSPITAL_COMMUNITY): Payer: Self-pay

## 2024-01-18 ENCOUNTER — Other Ambulatory Visit: Payer: Self-pay | Admitting: Child and Adolescent Psychiatry

## 2024-01-18 ENCOUNTER — Telehealth (INDEPENDENT_AMBULATORY_CARE_PROVIDER_SITE_OTHER): Payer: MEDICAID | Admitting: Student in an Organized Health Care Education/Training Program

## 2024-01-18 ENCOUNTER — Encounter (HOSPITAL_COMMUNITY): Payer: Self-pay | Admitting: Student in an Organized Health Care Education/Training Program

## 2024-01-18 DIAGNOSIS — F333 Major depressive disorder, recurrent, severe with psychotic symptoms: Secondary | ICD-10-CM

## 2024-01-18 DIAGNOSIS — F330 Major depressive disorder, recurrent, mild: Secondary | ICD-10-CM

## 2024-01-18 DIAGNOSIS — F411 Generalized anxiety disorder: Secondary | ICD-10-CM

## 2024-01-18 DIAGNOSIS — F4310 Post-traumatic stress disorder, unspecified: Secondary | ICD-10-CM

## 2024-01-18 MED ORDER — SERTRALINE HCL 100 MG PO TABS
150.0000 mg | ORAL_TABLET | Freq: Every day | ORAL | 2 refills | Status: DC
  Filled 2024-01-18 – 2024-02-27 (×3): qty 45, 30d supply, fill #0

## 2024-01-18 MED ORDER — ARIPIPRAZOLE 5 MG PO TABS
5.0000 mg | ORAL_TABLET | Freq: Every day | ORAL | 2 refills | Status: DC
  Filled 2024-01-18 – 2024-02-27 (×3): qty 30, 30d supply, fill #0

## 2024-01-18 MED ORDER — TRAZODONE HCL 50 MG PO TABS
25.0000 mg | ORAL_TABLET | Freq: Every day | ORAL | 2 refills | Status: DC
  Filled 2024-01-18: qty 30, 60d supply, fill #0

## 2024-01-18 MED ORDER — HYDROXYZINE HCL 25 MG PO TABS
25.0000 mg | ORAL_TABLET | Freq: Two times a day (BID) | ORAL | 2 refills | Status: DC | PRN
  Filled 2024-01-18: qty 60, 30d supply, fill #0

## 2024-01-18 NOTE — Progress Notes (Signed)
Virtual Visit via Video Note  I connected with Sherri [name] on 01/18/24 at 10:30 AM EST by a video enabled telemedicine application and verified that I am speaking with the correct person using two identifiers.  Location: Patient: Home Provider: Office   I discussed the limitations of evaluation and management by telemedicine and the availability of in person appointments. The patient expressed understanding and agreed to proceed.    I discussed the assessment and treatment plan with the patient. The patient was provided an opportunity to ask questions and all were answered. The patient agreed with the plan and demonstrated an understanding of the instructions.   The patient was advised to call back or seek an in-person evaluation if the symptoms worsen or if the condition fails to improve as anticipated.  I provided 25 minutes of non-face-to-face time during this encounter.   Sherri Morton, MD [facility] MD/PA/NP OP Progress Note  01/18/2024 2:00 PM Sherri [name]  MRN:  [id number]  Chief Complaint:  Chief Complaint  Patient presents with   Follow-up   HPI:  Sherri [name] is a 23 year old female seen today for follow-up psychiatric evaluation. She has a psychiatric history of bipolar disorder, SI, anxiety and depression.  Patient reports she has been compliant with the following medications   Abilify 5mg  daily Zoloft 150 mg daily Trazodone 25mg  at bedtime PRN Melatonin 3mg  at bedtime PRN Klonopin 1mg  QHS, rx Dr. Dina Rich at [facility] Nexplanon Vit D- daily Metformin 500mg  BID  Patient denies that the hydroxyzine makes her sleepy.  Patient reports that she is doing well. Patient reports that she is sleeping well and only needs melatonin PRN. Patiet reports that her mood is "good" and denies AVH, SI, and HI. Patient reports that she is having decreased tics in public and is taking the Hydroxyzine PRN before going out and thinks that is a main contributor to the improvement.  Patient reports that she feels more comfortable with hair school, but will hold off until they finally move. Patient reports that her appetite is stable. Patient reports that she has been more anxious about moving. Patient reports that she feels she has control over her worries. Patient reports that her concentration is good. Patient denies feeling hopeless, hopeless, or worthless nor dysphoric.   Patient denies frequent nightmares and hypervigilance. Patient reports that without the Avbilify, 2 weeks ago while sick, she did start seeing and hearing things and felt more on edge. Once she was back on Abilify the hallucinations and hypervigilance resolved.     Visit Diagnosis:    ICD-10-CM   1. PTSD (post-traumatic stress disorder)  F43.10     2. Severe episode of recurrent major depressive disorder, with psychotic features (HCC)  F33.3     3. Generalized anxiety disorder  F41.1     4. MDD (major depressive disorder), recurrent episode, mild (HCC)  F33.0          Past Psychiatric History: Bipolar disorder ? (Unsure of who dx her with this or when), SI, anxiety and depression  05/2023- EKG QTC: 413 2020- started having AVH, notes that it started right after her father died in 25-Feb-2018. Things only got worse during Covid, she stopped going to school even online.    11/2022-started patient on risperidone 0.5 mg twice daily due to patient endorsing stealing things and feeling more impulsive without being on risperidone.   03/2023- having nipple discharge on risperdal, dc'd risperdal. Concern that patient's  anxiety and related to  Avoidant PD traits,   05/2023-pattern of depressive symptoms that coincide with the sudden onset of AVH then get better when her depression resolves . Started abilify 2mg . Continues to endorse multiple PTSD symptoms. Increased Zoloft to 100mg . Continued Trazodone 25mg  w. OTC melatonin  07/2023-Appears to be doing fairly well as she is endorsing significant decrease in her  PTSD symptoms however her tics appear to be associated with hyperarousal. No med adjustments made  09/2023-Endorsing mild AVH. Will increase Abilify to 5mg . Also endorsd a stressor of sister moving which coincided with feeing that tics suddenly worsened. Has a therapist: Percell Miller, @ [facility] since11/2024  11/2023-improved insight in regards to how anxiety appears to worsen tics.  Patient endorses some social anxiety, increase Zoloft to 150 mg and also started hydroxyzine 25 mg as needed, try before going to triggering public spaces such as grocery stores.  Past Medical History:  Past Medical History:  Diagnosis Date    Chronic pain 02/02/2020   Abnormal vision screen 08/26/2015   Acanthosis nigricans 02/26/2020   Acid reflux    Allergy    Seasonal allergies   Anxiety    Phreesia 11/24/2020   Asthma    Atopic dermatitis 07/14/2013   Carpal tunnel syndrome, bilateral 11/21/2018   S/p bilat surgery, release   Chest pain 07/16/2019   Concussion 02/16/2021   Approx 2/15 d/t hit posterior skull against wall during tic.    Constipation 06/17/2013   Cough 01/29/2021   COVID-19 01/22/2020   Eczema    Elevated blood pressure reading 02/26/2020   Gallstones    GERD (gastroesophageal reflux disease)    Phreesia 11/24/2020   Headache    Obesity    Pain of both hip joints 11/10/2019   PCOS (polycystic ovarian syndrome) 02/26/2020   Recurrent viral infection 05/15/2022   Seasonal allergies    Tic    Toothache 11/06/2022    Past Surgical History:  Procedure Laterality Date   BILATERAL CARPAL TUNNEL RELEASE     CHOLECYSTECTOMY N/A 08/08/2017   Procedure: LAPAROSCOPIC CHOLECYSTECTOMY;  Surgeon: Kandice Hams, MD;  Location: [facility] OR;  Service: General;  Laterality: N/A;   UPPER GASTROINTESTINAL ENDOSCOPY      Family Psychiatric History: Mother depression and OCD, brother depression, Sister depression, 2 brother learning disability, a maternal aunt depression   Family  History:  Family History  Problem Relation Age of Onset   Asthma Mother    Arthritis Mother    Depression Mother    Diabetes Mother    Hypertension Mother    Obesity Mother    Irritable bowel syndrome Mother    Asthma Sister    Learning disabilities Sister    Stroke Sister    Asthma Brother    Depression Brother    Learning disabilities Brother    Irritable bowel syndrome Brother    Depression Maternal Aunt    Diabetes Maternal Aunt    Mental illness Maternal Aunt    Diabetes Maternal Uncle    Kidney disease Maternal Uncle    Arthritis Maternal Grandmother    Diabetes Maternal Grandmother    Heart disease Maternal Grandmother    Kidney disease Maternal Grandmother    Hypertension Maternal Grandmother    Hyperlipidemia Maternal Grandmother    Cervical cancer Maternal Grandmother    Stroke Maternal Grandfather    Cancer Other    COPD Other    Heart disease Other    Hypertension Other    Obesity Father    Heart attack Father  Heart disease Paternal Grandmother    Hypertension Paternal Grandmother    Colon cancer Neg Hx    Esophageal cancer Neg Hx    Pancreatic cancer Neg Hx    Stomach cancer Neg Hx     Social History:  Social History   Socioeconomic History   Marital status: Single    Spouse name: Not on file   Number of children: 0   Years of education: 12   Highest education level: Some college, no degree  Occupational History   Occupation: Furniture conservator/restorer to college   Occupation: Seeking disability  Tobacco Use   Smoking status: Never    Passive exposure: Yes   Smokeless tobacco: Never   Tobacco comments:    mom stopped smoking  Vaping Use   Vaping status: Never Used  Substance and Sexual Activity   Alcohol use: No   Drug use: No   Sexual activity: Never  Other Topics Concern   Not on file  Social History Narrative   Lives with Mom and some siblings, some of whom have children of their own, dropped out of [employer]. plans to start [employer] for GED.    Right-handed.   No daily use of caffeine.    Social Drivers of Corporate investment banker Strain: Low Risk  (12/21/2023)   Overall Financial Resource Strain (CARDIA)    Difficulty of Paying Living Expenses: Not hard at all  Food Insecurity: Food Insecurity Present (12/21/2023)   Hunger Vital Sign    Worried About Running Out of Food in the Last Year: Sometimes true    Ran Out of Food in the Last Year: Sometimes true  Transportation Needs: Unmet Transportation Needs (12/21/2023)   PRAPARE - Administrator, Civil Service (Medical): Yes    Lack of Transportation (Non-Medical): Yes  Physical Activity: Insufficiently Active (12/21/2023)   Exercise Vital Sign    Days of Exercise per Week: 1 day    Minutes of Exercise per Session: 10 min  Stress: No Stress Concern Present (12/21/2023)   [employer] of Occupational Health - Occupational Stress Questionnaire    Feeling of Stress : Only a little  Social Connections: Socially Isolated (12/21/2023)   Social Connection and Isolation Panel [NHANES]    Frequency of Communication with Friends and Family: Twice a week    Frequency of Social Gatherings with Friends and Family: More than three times a week    Attends Religious Services: Never    Database administrator or Organizations: No    Attends Engineer, structural: Not on file    Marital Status: Never married    Allergies:  Allergies  Allergen Reactions   Ibuprofen Other (See Comments)    Chest pain   Shellfish Allergy Hives and Itching    Mouth itches, facial hives.     Metabolic Disorder Labs: Lab Results  Component Value Date   HGBA1C 5.8 (A) 12/21/2023   MPG 123 02/11/2020   MPG 126 07/18/2019   Lab Results  Component Value Date   PROLACTIN 7.0 05/25/2023   PROLACTIN 12.2 07/18/2019   Lab Results  Component Value Date   CHOL 176 03/01/2023   TRIG 50 03/01/2023   HDL 73 03/01/2023   CHOLHDL 2.4 03/01/2023   VLDL 16 07/25/2013   LDLCALC 93 03/01/2023    LDLCALC 79 01/27/2021   Lab Results  Component Value Date   TSH 0.450 05/25/2023   TSH 0.820 01/11/2023    Therapeutic Level Labs: No results found  for: "LITHIUM" No results found for: "VALPROATE" No results found for: "CBMZ"  Current Medications: Current Outpatient Medications  Medication Sig Dispense Refill   albuterol (PROVENTIL) (2.5 MG/3ML) 0.083% nebulizer solution Take 3 mLs (2.5 mg total) by nebulization every 6 (six) hours as needed for wheezing or shortness of breath. 75 mL 12   albuterol (VENTOLIN HFA) 108 (90 Base) MCG/ACT inhaler Inhale 2 puffs into the lungs every 6 (six) hours as needed for wheezing or shortness of breath. 18 g 2   ARIPiprazole (ABILIFY) 5 MG tablet Take 1 tablet (5 mg total) by mouth daily. 30 tablet 1   budesonide-formoterol (SYMBICORT) 160-4.5 MCG/ACT inhaler Inhale 2 puffs into the lungs 2 (two) times daily. 10.2 g 3   cholecalciferol (VITAMIN D3) 25 MCG (1000 UNIT) tablet Take 1 tablet (1,000 Units total) by mouth daily. Return for vit D recheck when this bottle runs out. 90 tablet 0   clonazePAM (KLONOPIN) 0.5 MG tablet Take 1 tablet (0.5 mg total) by mouth Nightly. (Patient not taking: Reported on 12/21/2023) 30 tablet 3   clonazePAM (KLONOPIN) 0.5 MG tablet Take 1 tablet (0.5 mg total) by mouth at bedtime. 30 tablet 3   clonazePAM (KLONOPIN) 0.5 MG tablet Take 2 tablets (1mg ) by mouth at bedtime. 30 tablet 3   dicyclomine (BENTYL) 10 MG capsule Take 1 capsule (10 mg total) by mouth 2 (two) times daily as needed for spasms. 60 capsule 0   etonogestrel (NEXPLANON) 68 MG IMPL implant 1 each (68 mg total) by Subdermal route once. 1 each 0   famotidine (PEPCID) 20 MG tablet Take 1 tablet (20 mg total) by mouth 2 (two) times daily. 180 tablet 1   hydrOXYzine (ATARAX) 25 MG tablet Take 1 tablet (25 mg total) by mouth 3 (three) times daily as needed. 90 tablet 1   linaclotide (LINZESS) 72 MCG capsule Take 1 capsule (72 mcg total) by mouth daily before  breakfast. 90 capsule 3   metFORMIN (GLUCOPHAGE) 500 MG tablet Take 1 tablet (500 mg total) by mouth 2 (two) times daily with a meal. 180 tablet 3   montelukast (SINGULAIR) 10 MG tablet Take 1 tablet (10 mg total) by mouth at bedtime. 90 tablet 3   pantoprazole (PROTONIX) 20 MG tablet Take 1 tablet (20 mg total) by mouth 2 (two) times daily. 60 tablet 2   sertraline (ZOLOFT) 100 MG tablet Take 1.5 tablets (150 mg total) by mouth daily. 45 tablet 1   Spacer/Aero Chamber Mouthpiece MISC 1 Device by Does not apply route as needed. 1 each 0   traZODone (DESYREL) 50 MG tablet Take 1/2 tablet (25 mg total) by mouth at bedtime. 30 tablet 1   triamcinolone cream in Minerin Creme Apply 1 application topically 2 (two) times daily. Do not use for more than 7 consecutive days 454 g 1   No current facility-administered medications for this visit.     Psychiatric Specialty Exam: Review of Systems  Psychiatric/Behavioral:  Negative for dysphoric mood, hallucinations, sleep disturbance and suicidal ideas. The patient is not nervous/anxious.     There were no vitals taken for this visit.There is no height or weight on file to calculate BMI.  General Appearance: Fairly Groomed  Eye Contact:  Good  Speech:  Clear and Coherent  Volume:  Normal  Mood:  Euthymic  Affect:  Appropriate  Thought Process:  Coherent  Orientation:  Full (Time, Place, and Person)  Thought Content: Logical   Suicidal Thoughts:  No  Homicidal Thoughts:  No  Memory:  Immediate;   Good Recent;   Good  Judgement:  Good  Insight:  Good  Psychomotor Activity:   Normal  Concentration:  Concentration: Good  Recall:  Good  Fund of Knowledge: Good  Language: Good  Akathisia:  No  Handed:    AIMS (if indicated): done  Assets:  Communication Skills Desire for Improvement Housing Leisure Time Resilience Social Support Transportation  ADL's:  Intact  Cognition: WNL  Sleep:  Good   Screenings: AIMS    Flowsheet Row  Clinical Support from 07/27/2023 in [facility]  AIMS Total Score 4      GAD-7    Flowsheet Row Video Visit from 09/06/2022 in [facility] Clinical Support from 01/30/2022 in [facility] Office Visit from 11/03/2021 in [facility] Office Visit from [facility]08/[facility] Video Visit from 02/10/2020 in [facility]  Total GAD-7 Score 13 19 18 12 21       PHQ2-9    Flowsheet Row Office Visit from 05/25/2023 in [facility] - A Dept Of [place]. [facility] Most recent reading at 05/25/2023  1:50 PM Office Visit from 03/30/2023 in [facility] - A Dept Of [place]. [facility] Most recent reading at 03/30/2023  1:19 PM Office Visit from 03/30/2023 in [facility] - A Dept Of Eligha Bridegroom. [facility] Most recent reading at 03/30/2023 10:56 AM Office Visit from 03/01/2023 in [facility] - A Dept Of [place]. [facility] Most recent reading at 03/01/2023 11:01 AM Office Visit from 01/23/2023 in [facility] - A Dept Of El[place]a Bridegroom. [facility] Most recent reading at 01/23/2023  9:46 AM  PHQ-2 Total Score 5 1 1 2 4   PHQ-9 Total Score 19 7 8 10 14       Flowsheet Row ED from 02/04/2023 in [facility] Department at [facility] Video Visit from 06/05/2022 in [facility] ED from 05/01/2022 in [facility] RISK CATEGORY No Risk Error: Q7 should not be populated when Q6 is No No Risk        Assessment and Plan: Patient currently doing well and seen benefit in recent medication adjustments.  Patient noticing decreased tics if she takes hydroxyzine before going into social situations, no medication  adjustments today.  Patient did not have any observable tic movements during assessment today.  GAD PTSD Hx of MDD,severe w/ psychotic features MDD in remission - Continue Abilify to 5mg  daily - Continue Zoloft 150mg  - Continue Trazodone 25mg  w/ OTC melatonin for sleep PRN - Continue Hydroxyzine 25mg  TID PRN  Tic/ movement d/o of etiology likely psychiatrically related - reoccurring, see Dr. Dina Rich notes @[facility]  Collaboration of Care: Collaboration of Care:   Patient/Guardian was advised Release of Information must be obtained prior to any record release in order to collaborate their care with an outside provider. Patient/Guardian was advised if they have not already done so to contact the registration department to sign all necessary forms in order for [place] to release information regarding their care.   Consent: Patient/Guardian gives verbal consent for treatment and assignment of benefits for services provided during this visit. Patient/Guardian expressed understanding and agreed to  proceed.   PGY-4 Sherri Morton, MD 01/18/2024, 2:00 PM

## 2024-01-18 NOTE — Addendum Note (Signed)
Addended by: Eliseo Gum B on: 01/18/2024 02:15 PM   Modules accepted: Level of Service

## 2024-01-22 ENCOUNTER — Other Ambulatory Visit: Payer: Self-pay

## 2024-01-24 ENCOUNTER — Other Ambulatory Visit (HOSPITAL_COMMUNITY): Payer: Self-pay

## 2024-01-24 ENCOUNTER — Other Ambulatory Visit: Payer: Self-pay | Admitting: Family Medicine

## 2024-01-24 ENCOUNTER — Other Ambulatory Visit: Payer: Self-pay

## 2024-01-24 ENCOUNTER — Encounter (HOSPITAL_COMMUNITY): Payer: Self-pay

## 2024-01-24 DIAGNOSIS — J4530 Mild persistent asthma, uncomplicated: Secondary | ICD-10-CM

## 2024-01-24 MED ORDER — BUDESONIDE-FORMOTEROL FUMARATE 160-4.5 MCG/ACT IN AERO
2.0000 | INHALATION_SPRAY | Freq: Two times a day (BID) | RESPIRATORY_TRACT | 3 refills | Status: DC
  Filled 2024-01-24: qty 10.2, 30d supply, fill #0
  Filled 2024-02-27: qty 10.2, 30d supply, fill #1
  Filled 2024-06-17: qty 10.2, 30d supply, fill #2

## 2024-01-25 ENCOUNTER — Other Ambulatory Visit (HOSPITAL_COMMUNITY): Payer: Self-pay

## 2024-02-05 ENCOUNTER — Other Ambulatory Visit (HOSPITAL_COMMUNITY): Payer: Self-pay

## 2024-02-05 ENCOUNTER — Other Ambulatory Visit: Payer: Self-pay

## 2024-02-05 ENCOUNTER — Other Ambulatory Visit: Payer: Self-pay | Admitting: Family Medicine

## 2024-02-05 DIAGNOSIS — J4530 Mild persistent asthma, uncomplicated: Secondary | ICD-10-CM

## 2024-02-05 MED ORDER — MONTELUKAST SODIUM 10 MG PO TABS
10.0000 mg | ORAL_TABLET | Freq: Every day | ORAL | 3 refills | Status: DC
Start: 2024-02-05 — End: 2024-03-24
  Filled 2024-02-05: qty 90, 90d supply, fill #0

## 2024-02-06 ENCOUNTER — Other Ambulatory Visit (HOSPITAL_COMMUNITY): Payer: Self-pay

## 2024-02-06 ENCOUNTER — Other Ambulatory Visit: Payer: Self-pay

## 2024-02-06 MED ORDER — WEGOVY 0.25 MG/0.5ML ~~LOC~~ SOAJ
0.2500 mg | SUBCUTANEOUS | 0 refills | Status: DC
  Filled 2024-02-06: qty 2, 28d supply, fill #0

## 2024-02-06 MED ORDER — WEGOVY 0.5 MG/0.5ML ~~LOC~~ SOAJ
0.5000 mg | SUBCUTANEOUS | 0 refills | Status: DC
Start: 2024-02-06 — End: 2024-05-28
  Filled 2024-02-27: qty 2, 28d supply, fill #0

## 2024-02-07 ENCOUNTER — Other Ambulatory Visit (HOSPITAL_COMMUNITY): Payer: Self-pay

## 2024-02-19 ENCOUNTER — Other Ambulatory Visit (HOSPITAL_COMMUNITY): Payer: Self-pay

## 2024-02-27 ENCOUNTER — Other Ambulatory Visit (HOSPITAL_BASED_OUTPATIENT_CLINIC_OR_DEPARTMENT_OTHER): Payer: Self-pay

## 2024-02-27 ENCOUNTER — Other Ambulatory Visit (HOSPITAL_COMMUNITY): Payer: Self-pay

## 2024-02-27 ENCOUNTER — Other Ambulatory Visit: Payer: Self-pay

## 2024-02-27 ENCOUNTER — Encounter: Payer: Self-pay | Admitting: Pharmacist

## 2024-02-28 ENCOUNTER — Other Ambulatory Visit (HOSPITAL_COMMUNITY): Payer: Self-pay

## 2024-02-28 ENCOUNTER — Other Ambulatory Visit: Payer: Self-pay

## 2024-02-28 ENCOUNTER — Other Ambulatory Visit (HOSPITAL_BASED_OUTPATIENT_CLINIC_OR_DEPARTMENT_OTHER): Payer: Self-pay

## 2024-02-29 ENCOUNTER — Other Ambulatory Visit (HOSPITAL_COMMUNITY): Payer: Self-pay

## 2024-03-05 ENCOUNTER — Other Ambulatory Visit: Payer: Self-pay

## 2024-03-05 ENCOUNTER — Other Ambulatory Visit (HOSPITAL_COMMUNITY): Payer: Self-pay

## 2024-03-05 ENCOUNTER — Ambulatory Visit: Payer: MEDICAID | Admitting: Internal Medicine

## 2024-03-05 MED ORDER — ALBUTEROL SULFATE (2.5 MG/3ML) 0.083% IN NEBU
3.0000 mL | INHALATION_SOLUTION | Freq: Four times a day (QID) | RESPIRATORY_TRACT | 0 refills | Status: DC | PRN
Start: 2024-03-04 — End: 2024-03-24
  Filled 2024-03-05: qty 150, 13d supply, fill #0

## 2024-03-05 MED ORDER — BENZONATATE 100 MG PO CAPS
100.0000 mg | ORAL_CAPSULE | Freq: Three times a day (TID) | ORAL | 0 refills | Status: DC
  Filled 2024-03-05: qty 15, 5d supply, fill #0

## 2024-03-05 MED ORDER — ALBUTEROL SULFATE HFA 108 (90 BASE) MCG/ACT IN AERS
INHALATION_SPRAY | RESPIRATORY_TRACT | 0 refills | Status: DC
Start: 2024-03-04 — End: 2024-06-23
  Filled 2024-03-05: qty 18, 17d supply, fill #0

## 2024-03-20 ENCOUNTER — Telehealth: Payer: Self-pay | Admitting: Student

## 2024-03-20 NOTE — Telephone Encounter (Signed)
 Reviewed patient's form Placed in PCP's box to be completed.  Drusilla Kanner, CMA

## 2024-03-20 NOTE — Telephone Encounter (Signed)
 Patient dropped off Plasma donation form to be completed. Last DOS was 12/21/23. Placed in [employer].

## 2024-03-24 ENCOUNTER — Ambulatory Visit (INDEPENDENT_AMBULATORY_CARE_PROVIDER_SITE_OTHER): Payer: MEDICAID | Admitting: Student

## 2024-03-24 VITALS — BP 122/80 | HR 100 | Ht 60.0 in | Wt 318.2 lb

## 2024-03-24 DIAGNOSIS — Z52008 Unspecified donor, other blood: Secondary | ICD-10-CM | POA: Diagnosis not present

## 2024-03-24 DIAGNOSIS — F339 Major depressive disorder, recurrent, unspecified: Secondary | ICD-10-CM | POA: Diagnosis not present

## 2024-03-24 NOTE — Assessment & Plan Note (Signed)
 Med rec and medical history discussed in regards to plasma donation.

## 2024-03-24 NOTE — Assessment & Plan Note (Signed)
 Well-controlled per patient, recommend returning to see psychiatric provider given she is no longer taking Zoloft nor Abilify.

## 2024-03-24 NOTE — Progress Notes (Signed)
  SUBJECTIVE:   CHIEF COMPLAINT / HPI:   Presents today regarding plasma donations.  She would like to donate plasma and has submitted paperwork for me to review so she may do this.  She has completed a full medication reconciliation with me today.  She intends on donating plasma at least a couple times per month.  PERTINENT  PMH / PSH: Asthma, prediabetes, morbid obesity, GERD, depression  OBJECTIVE:  BP 122/80   Pulse 100   Ht 5' (1.524 m)   Wt (!) 318 lb 3.2 oz (144.3 kg)   SpO2 98%   BMI 62.14 kg/m  General: Well-appearing, NAD HEENT: No appreciable thyromegaly or thyroid nodularity CV: RRR, no murmurs appreciable Pulm: Normal WOB  ASSESSMENT/PLAN:   Assessment & Plan Depression, recurrent (HCC) Well-controlled per patient, recommend returning to see psychiatric provider given she is no longer taking Zoloft nor Abilify. Plasma donor Med rec and medical history discussed in regards to plasma donation.  Shelby Mattocks, DO 03/24/2024, 2:55 PM PGY-3, [facility]

## 2024-03-25 NOTE — Telephone Encounter (Signed)
 Forms placed up front pick up.   Copy made for batch scanning.   Placed in fax pile.   Patient aware.

## 2024-04-01 ENCOUNTER — Other Ambulatory Visit (HOSPITAL_COMMUNITY): Payer: Self-pay

## 2024-04-01 MED ORDER — WEGOVY 1 MG/0.5ML ~~LOC~~ SOAJ
1.0000 mg | SUBCUTANEOUS | 0 refills | Status: DC
  Filled 2024-04-01: qty 2, 28d supply, fill #0

## 2024-04-04 ENCOUNTER — Telehealth (INDEPENDENT_AMBULATORY_CARE_PROVIDER_SITE_OTHER): Payer: MEDICAID | Admitting: Student in an Organized Health Care Education/Training Program

## 2024-04-04 ENCOUNTER — Encounter (HOSPITAL_COMMUNITY): Payer: Self-pay | Admitting: Student in an Organized Health Care Education/Training Program

## 2024-04-04 DIAGNOSIS — F3340 Major depressive disorder, recurrent, in remission, unspecified: Secondary | ICD-10-CM | POA: Diagnosis not present

## 2024-04-04 DIAGNOSIS — F4310 Post-traumatic stress disorder, unspecified: Secondary | ICD-10-CM | POA: Diagnosis not present

## 2024-04-04 NOTE — Progress Notes (Signed)
 Virtual Visit via Video Note  I connected with Sherri [name] on 04/04/24 at 11:00 AM EDT by a video enabled telemedicine application and verified that I am speaking with the correct person using two identifiers.  Location: Patient: Home Provider: Office   I discussed the limitations of evaluation and management by telemedicine and the availability of in person appointments. The patient expressed understanding and agreed to proceed.    I discussed the assessment and treatment plan with the patient. The patient was provided an opportunity to ask questions and all were answered. The patient agreed with the plan and demonstrated an understanding of the instructions.   The patient was advised to call back or seek an in-person evaluation if the symptoms worsen or if the condition fails to improve as anticipated.  I provided 25 minutes of non-face-to-face time during this encounter.   Elynor Kallenberger B Madalina Rosman, MD [facility] MD/PA/NP OP Progress Note  04/04/2024 1:14 PM Sherri [name]  MRN:  [id number]  Chief Complaint:  Chief Complaint  Patient presents with   Follow-up   HPI:  Sherri [name] is a 23 year old female seen today for follow-up psychiatric evaluation. She has a psychiatric history of SI, anxiety and depression.  Patient was last prescribed the following.   Abilify  5mg  daily Zoloft  150 mg daily Trazodone  25mg  at bedtime PRN Melatonin 3mg  at bedtime PRN Klonopin  1mg  QHS, rx Dr. Morita at [facility] Nexplanon  Vit D- daily Metformin  500mg  BID  At PCP in 03/2024 patient denied taking Abilify  and Zoloft  and endorsed depression was well controlled.  Patient confirms that she is not taking Abilify  or Zoloft .  Pt reports that she self titrated off the Zoloft  and Abilify . Pt reports that she does not really need the Trazodone  or Melatonin much. Pt reports that she only takes 12.5 mg of trazodone  when she takes it. Pt reports that she is in the process of moving with her family, so she has  been up packing.   Patient reports that she is doing well. Patient reports that she sleeps well and is eating well. Pt reports that her tics have calmed down some. Pt reports she is still on the Klonopin . 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 Pt reports she feels "a lot more happy.". Pt reports that she is not having AVH, SI, and HI. Pt denies nightmares. Pt denies feeling that she cannot control worries. Pt denies anhedonia and has a good energy level. Pt denies constant irritability. Pt denies manic symptoms.     Visit Diagnosis:    ICD-10-CM   1. MDD (recurrent major depressive disorder) in remission (HCC)  F33.40     2. PTSD (post-traumatic stress disorder)  F43.10           Past Psychiatric History: Bipolar disorder ? (Unsure of who dx her with this or when), SI, anxiety and depression  05/2023- EKG QTC: 413 2020- started having AVH, notes that it started right after her father died in 04/21/2018. Things only got worse during Covid, she stopped going to school even online.    11/2022-started patient on risperidone  0.5 mg twice daily due to patient endorsing stealing things and feeling more impulsive without being on risperidone .   03/2023- having nipple discharge on risperdal , dc'd risperdal . Concern that patient's  anxiety and related to Avoidant PD traits,   05/2023-pattern of depressive symptoms that coincide with the sudden onset of AVH then get  better when her depression resolves . Started abilify  2mg . Continues to endorse multiple PTSD symptoms. Increased Zoloft  to 100mg . Continued Trazodone  25mg  w. OTC melatonin  07/2023-Appears to be doing fairly well as she is endorsing significant decrease in her PTSD symptoms however her tics appear to be associated with hyperarousal. No med adjustments made  09/2023-Endorsing mild AVH. Will increase Abilify  to 5mg . Also endorsd a stressor of sister moving which coincided  with feeing that tics suddenly worsened. Has a therapist: [place], @ [facility] since11/2024  11/2023-improved insight in regards to how anxiety appears to worsen tics.  Patient endorses some social anxiety, increase Zoloft  to 150 mg and also started hydroxyzine  25 mg as needed, try before going to triggering public spaces such as grocery stores.  Past Medical History:  Past Medical History:  Diagnosis Date    Chronic pain 02/02/2020   Abnormal vision screen 08/26/2015   Acanthosis nigricans 02/26/2020   Acid reflux    Allergy    Seasonal allergies   Anxiety    Phreesia 11/24/2020   Asthma    Atopic dermatitis 07/14/2013   Carpal tunnel syndrome, bilateral 11/21/2018   S/p bilat surgery, release   Chest pain 07/16/2019   Concussion 02/16/2021   Approx 2/15 d/t hit posterior skull against wall during tic.    Constipation 06/17/2013   Cough 01/29/2021   COVID-19 01/22/2020   Eczema    Elevated blood pressure reading 02/26/2020   Gallstones    GERD (gastroesophageal reflux disease)    Phreesia 11/24/2020   Headache    Obesity    Pain of both hip joints 11/10/2019   PCOS (polycystic ovarian syndrome) 02/26/2020   Recurrent viral infection 05/15/2022   Seasonal allergies    Tic    Toothache 11/06/2022    Past Surgical History:  Procedure Laterality Date   BILATERAL CARPAL TUNNEL RELEASE     CHOLECYSTECTOMY N/A 08/08/2017   Procedure: LAPAROSCOPIC CHOLECYSTECTOMY;  Surgeon: Verlena Glenn, MD;  Location: [facility] OR;  Service: General;  Laterality: N/A;   UPPER GASTROINTESTINAL ENDOSCOPY      Family Psychiatric History: Mother depression and OCD, brother depression, Sister depression, 2 brother learning disability, a maternal aunt depression   Family History:  Family History  Problem Relation Age of Onset   Asthma Mother    Arthritis Mother    Depression Mother    Diabetes Mother    Hypertension Mother    Obesity Mother    Irritable bowel syndrome Mother     Asthma Sister    Learning disabilities Sister    Stroke Sister    Asthma Brother    Depression Brother    Learning disabilities Brother    Irritable bowel syndrome Brother    Depression Maternal Aunt    Diabetes Maternal Aunt    Mental illness Maternal Aunt    Diabetes Maternal Uncle    Kidney disease Maternal Uncle    Arthritis Maternal Grandmother    Diabetes Maternal Grandmother    Heart disease Maternal Grandmother    Kidney disease Maternal Grandmother    Hypertension Maternal Grandmother    Hyperlipidemia Maternal Grandmother    Cervical cancer Maternal Grandmother    Stroke Maternal Grandfather    Cancer Other    COPD Other    Heart disease Other    Hypertension Other    Obesity Father    Heart attack Father    Heart disease Paternal Grandmother    Hypertension Paternal Grandmother    Colon cancer Neg Hx  Esophageal cancer Neg Hx    Pancreatic cancer Neg Hx    Stomach cancer Neg Hx     Social History:  Social History   Socioeconomic History   Marital status: Single    Spouse name: Not on file   Number of children: 0   Years of education: 12   Highest education level: Some college, no degree  Occupational History   Occupation: Furniture conservator/restorer to college   Occupation: Seeking disability  Tobacco Use   Smoking status: Never    Passive exposure: Yes   Smokeless tobacco: Never   Tobacco comments:    mom stopped smoking  Vaping Use   Vaping status: Never Used  Substance and Sexual Activity   Alcohol use: No   Drug use: No   Sexual activity: Never  Other Topics Concern   Not on file  Social History Narrative   Lives with Mom and some siblings, some of whom have children of their own, dropped out of [employer]. plans to start [employer] for GED.   Right-handed.   No daily use of caffeine.    Social Drivers of Corporate investment banker Strain: Low Risk  (01/28/2024)   Received from [facility]   Overall Financial Resource Strain (CARDIA)    Difficulty of Paying  Living Expenses: Not hard at all  Food Insecurity: Food Insecurity Present (01/28/2024)   [facility] from [facility]   Hunger Vital Sign    Worried About Running Out of Food in the Last Year: Sometimes true    Ran Out of Food in the Last Year: Sometimes true  Transportation Needs: Unmet Transportation Needs (01/28/2024)   Received from [facility]   PRAPARE - Transportation    Lack of Transportation (Medical): Yes    Lack of Transportation (Non-Medical): Yes  Physical Activity: Insufficiently Active (12/21/2023)   Exercise Vital Sign    Days of Exercise per Week: 1 day    Minutes of Exercise per Session: 10 min  Stress: No Stress Concern Present (12/21/2023)   [employer] of Occupational Health - Occupational Stress Questionnaire    Feeling of Stress : Only a little  Social Connections: Socially Isolated (12/21/2023)   Social Connection and Isolation Panel [NHANES]    Frequency of Communication with Friends and Family: Twice a week    Frequency of Social Gatherings with Friends and Family: More than three times a week    Attends Religious Services: Never    Database administrator or Organizations: No    Attends Engineer, structural: Not on file    Marital Status: Never married    Allergies:  Allergies  Allergen Reactions   Ibuprofen  Other (See Comments)    Chest pain   Shellfish Allergy Hives and Itching    Mouth itches, facial hives.     Metabolic Disorder Labs: Lab Results  Component Value Date   HGBA1C 5.8 (A) 12/21/2023   MPG 123 02/11/2020   MPG 126 07/18/2019   Lab Results  Component Value Date   PROLACTIN 7.0 05/25/2023   PROLACTIN 12.2 07/18/2019   Lab Results  Component Value Date   CHOL 176 03/01/2023   TRIG 50 03/01/2023   HDL 73 03/01/2023   CHOLHDL 2.4 03/01/2023   VLDL 16 07/25/2013   LDLCALC 93 03/01/2023   LDLCALC 79 01/27/2021   Lab Results  Component Value Date   TSH 0.450 05/25/2023   TSH 0.820 01/11/2023    Therapeutic  Level Labs: No results found for: "LITHIUM"  No results found for: "VALPROATE" No results found for: "CBMZ"  Current Medications: Current Outpatient Medications  Medication Sig Dispense Refill   albuterol  (VENTOLIN  HFA) 108 (90 Base) MCG/ACT inhaler Inhale 2 puffs every 4 (four) hours as needed for wheezing. 18 g 0   budesonide -formoterol  (SYMBICORT ) 160-4.5 MCG/ACT inhaler Inhale 2 puffs into the lungs 2 (two) times daily. 10.2 g 3   clonazePAM  (KLONOPIN ) 0.5 MG tablet Take 2 tablets (1mg ) by mouth at bedtime. 30 tablet 3   dicyclomine  (BENTYL ) 10 MG capsule Take 1 capsule (10 mg total) by mouth 2 (two) times daily as needed for spasms. 60 capsule 0   etonogestrel  (NEXPLANON ) 68 MG IMPL implant 1 each (68 mg total) by Subdermal route once. 1 each 0   famotidine  (PEPCID ) 20 MG tablet Take 1 tablet (20 mg total) by mouth 2 (two) times daily. 180 tablet 1   linaclotide  (LINZESS ) 72 MCG capsule Take 1 capsule (72 mcg total) by mouth daily before breakfast. 90 capsule 3   metFORMIN  (GLUCOPHAGE ) 500 MG tablet Take 1 tablet (500 mg total) by mouth 2 (two) times daily with a meal. 180 tablet 3   pantoprazole  (PROTONIX ) 20 MG tablet Take 1 tablet (20 mg total) by mouth 2 (two) times daily. 60 tablet 2   Semaglutide -Weight Management (WEGOVY ) 0.5 MG/0.5ML SOAJ Inject 0.5 mg into the skin once a week. 2 mL 0   Semaglutide -Weight Management (WEGOVY ) 1 MG/0.5ML SOAJ Inject 1 mg into the skin once a week. 2 mL 0   triamcinolone  cream in Minerin Creme Apply 1 application topically 2 (two) times daily. Do not use for more than 7 consecutive days 454 g 1   No current facility-administered medications for this visit.     Psychiatric Specialty Exam: Review of Systems  Psychiatric/Behavioral:  Negative for dysphoric mood, hallucinations, sleep disturbance and suicidal ideas. The patient is not nervous/anxious.     There were no vitals taken for this visit.There is no height or weight on file to calculate  BMI.  General Appearance: Fairly Groomed  Eye Contact:  Good  Speech:  Clear and Coherent  Volume:  Normal  Mood:  Euthymic  Affect:  Appropriate  Thought Process:  Coherent  Orientation:  Full (Time, Place, and Person)  Thought Content: Logical   Suicidal Thoughts:  No  Homicidal Thoughts:  No  Memory:  Immediate;   Good Recent;   Good  Judgement:  Good  Insight:  Good  Psychomotor Activity:   Normal  Concentration:  Concentration: Good  Recall:  Good  Fund of Knowledge: Good  Language: Good  Akathisia:  No  Handed:    AIMS (if indicated): done  Assets:  Communication Skills Desire for Improvement Housing Leisure Time Resilience Social Support Transportation  ADL's:  Intact  Cognition: WNL  Sleep:  Good   Screenings: AIMS    Flowsheet Row Clinical Support from 07/27/2023 in [facility]  AIMS Total Score 4      GAD-7    Flowsheet Row Video Visit from 09/06/2022 in [facility] Clinical Support from 01/30/2022 in [facility] Office Visit from 11/03/2021 in [facility] Office Visit from [facility]08/2020 [facility] Video Visit from 02/10/2020 in [facility]  Total GAD-7 Score 13 19 18 12 21       PHQ2-9    Flowsheet Row  Office Visit from 03/24/2024 in [facility] - A Dept Of [place]. [facility] Most recent reading at 03/24/2024  1:56 PM Office Visit from 05/25/2023 in [facility] - A Dept Of [place]. [facility] Most recent reading at 05/25/2023  1:50 PM Office Visit from 03/30/2023 in [facility] - A Dept Of [place]. [facility] Most recent reading at 03/30/2023  1:19 PM Office Visit from 03/30/2023 in [facility] - A Dept Of [place]. [facility] Most recent  reading at 03/30/2023 10:56 AM Office Visit from 03/01/2023 in [facility] - A Dept Of [place]. [facility] Most recent reading at 03/01/2023 11:01 AM  PHQ-2 Total Score 0 5 1 1 2   PHQ-9 Total Score 3 19 7 8 10       Flowsheet Row ED from 02/04/2023 in [facility] at [facility] Video Visit from 06/05/2022 in [facility] ED from 05/01/2022 in [facility] RISK CATEGORY No Risk Error: Q7 should not be populated when Q6 is No No Risk        Assessment and Plan: Patient currently doing well and seen benefit in recent medication adjustments.  Patient noticing decreased tics if she takes hydroxyzine  before going into social situations, no medication adjustments today.  Patient did not have any observable tic movements during assessment today. Pt would like to have a few more f/u before final decision on need to remain in psychiatric clinic. Pt endorsed awareness that she has large life changes coming up and would like to make sure she is stable w/o meds during these changes.   GAD- stable PTSD- stable Hx of MDD,severe w/ psychotic features MDD in remission - Continue Trazodone  25mg  w/ OTC melatonin for sleep PRN - Continue Hydroxyzine  25mg  TID PRN  F/u in 3 months in clinic Discussed with patient pending transition of care to a new resident starting July 1st, and likely discontinuation of care by this provider at that time.    Tic/ movement d/o of etiology likely psychiatrically related (stable) - reoccurring, see Dr. Curley Double notes @[facility]  Collaboration of Care: Collaboration of Care:   Patient/Guardian was advised Release of Information must be obtained prior to any record release in order to collaborate their care with an outside provider. Patient/Guardian was advised if they have not already done so to contact the registration department to sign all necessary forms in order  for us  to release information regarding their care.   Consent: Patient/Guardian gives verbal consent for treatment and assignment of benefits for services provided during this visit. Patient/Guardian expressed understanding and agreed to proceed.   PGY-4 Tamera Falco, MD 04/04/2024, 1:14 PM

## 2024-04-16 ENCOUNTER — Other Ambulatory Visit (HOSPITAL_COMMUNITY): Payer: Self-pay

## 2024-04-16 ENCOUNTER — Encounter: Payer: Self-pay | Admitting: Pharmacist

## 2024-04-16 ENCOUNTER — Other Ambulatory Visit: Payer: Self-pay | Admitting: Physician Assistant

## 2024-04-16 ENCOUNTER — Other Ambulatory Visit: Payer: Self-pay

## 2024-04-16 ENCOUNTER — Other Ambulatory Visit: Payer: Self-pay | Admitting: Family Medicine

## 2024-04-16 DIAGNOSIS — K2960 Other gastritis without bleeding: Secondary | ICD-10-CM

## 2024-04-16 DIAGNOSIS — R7303 Prediabetes: Secondary | ICD-10-CM

## 2024-04-16 MED ORDER — WEGOVY 1.7 MG/0.75ML ~~LOC~~ SOAJ
0.7500 mL | SUBCUTANEOUS | 0 refills | Status: DC
  Filled 2024-04-16 – 2024-04-29 (×2): qty 3, 28d supply, fill #0

## 2024-04-16 MED ORDER — WEGOVY 2.4 MG/0.75ML ~~LOC~~ SOAJ: 0.7500 mL | SUBCUTANEOUS | 2 refills | Status: DC

## 2024-04-16 MED ORDER — FAMOTIDINE 20 MG PO TABS
20.0000 mg | ORAL_TABLET | Freq: Two times a day (BID) | ORAL | 0 refills | Status: DC
  Filled 2024-04-16: qty 60, 30d supply, fill #0

## 2024-04-17 ENCOUNTER — Other Ambulatory Visit: Payer: Self-pay

## 2024-04-17 ENCOUNTER — Other Ambulatory Visit (HOSPITAL_COMMUNITY): Payer: Self-pay

## 2024-04-17 MED ORDER — METFORMIN HCL 500 MG PO TABS
500.0000 mg | ORAL_TABLET | Freq: Two times a day (BID) | ORAL | 3 refills | Status: DC
  Filled 2024-04-17 – 2024-06-17 (×4): qty 180, 90d supply, fill #0

## 2024-04-20 ENCOUNTER — Encounter (HOSPITAL_BASED_OUTPATIENT_CLINIC_OR_DEPARTMENT_OTHER): Payer: Self-pay | Admitting: Emergency Medicine

## 2024-04-20 ENCOUNTER — Emergency Department (HOSPITAL_BASED_OUTPATIENT_CLINIC_OR_DEPARTMENT_OTHER)
Admission: EM | Admit: 2024-04-20 | Discharge: 2024-04-20 | Disposition: A | Discharge status: Home / Self Care | Payer: MEDICAID | Attending: Emergency Medicine | Admitting: Emergency Medicine

## 2024-04-20 ENCOUNTER — Other Ambulatory Visit (HOSPITAL_COMMUNITY): Payer: Self-pay

## 2024-04-20 DIAGNOSIS — R112 Nausea with vomiting, unspecified: Secondary | ICD-10-CM | POA: Insufficient documentation

## 2024-04-20 DIAGNOSIS — K219 Gastro-esophageal reflux disease without esophagitis: Secondary | ICD-10-CM | POA: Diagnosis not present

## 2024-04-20 DIAGNOSIS — R109 Unspecified abdominal pain: Secondary | ICD-10-CM | POA: Insufficient documentation

## 2024-04-20 DIAGNOSIS — R197 Diarrhea, unspecified: Secondary | ICD-10-CM | POA: Diagnosis not present

## 2024-04-20 DIAGNOSIS — Z794 Long term (current) use of insulin: Secondary | ICD-10-CM | POA: Diagnosis not present

## 2024-04-20 LAB — URINALYSIS, ROUTINE W REFLEX MICROSCOPIC
Bilirubin Urine: NEGATIVE
Glucose, UA: NEGATIVE mg/dL
Ketones, ur: NEGATIVE mg/dL
Leukocytes,Ua: NEGATIVE
Nitrite: NEGATIVE
Protein, ur: NEGATIVE mg/dL
Specific Gravity, Urine: 1.008 (ref 1.005–1.030)
pH: 5.5 (ref 5.0–8.0)

## 2024-04-20 LAB — LIPASE, BLOOD: Lipase: 29 U/L (ref 11–51)

## 2024-04-20 LAB — COMPREHENSIVE METABOLIC PANEL WITH GFR
ALT: 17 U/L (ref 0–44)
AST: 19 U/L (ref 15–41)
Albumin: 3.9 g/dL (ref 3.5–5.0)
Alkaline Phosphatase: 77 U/L (ref 38–126)
Anion gap: 10 (ref 5–15)
BUN: 9 mg/dL (ref 6–20)
CO2: 25 mmol/L (ref 22–32)
Calcium: 9.8 mg/dL (ref 8.9–10.3)
Chloride: 106 mmol/L (ref 98–111)
Creatinine, Ser: 0.82 mg/dL (ref 0.44–1.00)
GFR, Estimated: >60 mL/min (ref >60)
Glucose, Bld: 85 mg/dL (ref 70–99)
Potassium: 4 mmol/L (ref 3.5–5.1)
Sodium: 141 mmol/L (ref 135–145)
Total Bilirubin: 0.3 mg/dL (ref 0.0–1.2)
Total Protein: 7 g/dL (ref 6.5–8.1)

## 2024-04-20 LAB — CBC
HCT: 38.5 % (ref 36.0–46.0)
Hemoglobin: 12.2 g/dL (ref 12.0–15.0)
MCH: 25.3 pg — ABNORMAL LOW (ref 26.0–34.0)
MCHC: 31.7 g/dL (ref 30.0–36.0)
MCV: 79.9 fL — ABNORMAL LOW (ref 80.0–100.0)
Platelets: 458 10*3/uL — ABNORMAL HIGH (ref 150–400)
RBC: 4.82 MIL/uL (ref 3.87–5.11)
RDW: 16.6 % — ABNORMAL HIGH (ref 11.5–15.5)
WBC: 6.5 10*3/uL (ref 4.0–10.5)
nRBC: 0 % (ref 0.0–0.2)

## 2024-04-20 LAB — PREGNANCY, URINE: Preg Test, Ur: NEGATIVE

## 2024-04-20 MED ORDER — ACETAMINOPHEN 325 MG PO TABS
650.0000 mg | ORAL_TABLET | Freq: Once | ORAL | Status: AC
  Administered 2024-04-20: 650 mg via ORAL
  Filled 2024-04-20: qty 2

## 2024-04-20 MED ORDER — FAMOTIDINE 20 MG PO TABS
20.0000 mg | ORAL_TABLET | Freq: Two times a day (BID) | ORAL | 0 refills | Status: DC | PRN
  Filled 2024-04-20 – 2024-06-17 (×2): qty 30, 15d supply, fill #0

## 2024-04-20 MED ORDER — SODIUM CHLORIDE 0.9 % IV BOLUS
1000.0000 mL | Freq: Once | INTRAVENOUS | Status: AC
  Administered 2024-04-20: 1000 mL via INTRAVENOUS

## 2024-04-20 MED ORDER — PROMETHAZINE HCL 25 MG RE SUPP
25.0000 mg | Freq: Four times a day (QID) | RECTAL | 0 refills | Status: DC | PRN
  Filled 2024-04-20: qty 12, 3d supply, fill #0

## 2024-04-20 MED ORDER — FAMOTIDINE 20 MG PO TABS
20.0000 mg | ORAL_TABLET | Freq: Once | ORAL | Status: AC
  Administered 2024-04-20: 20 mg via ORAL
  Filled 2024-04-20: qty 1

## 2024-04-20 MED ORDER — ONDANSETRON 4 MG PO TBDP
4.0000 mg | ORAL_TABLET | Freq: Three times a day (TID) | ORAL | 0 refills | Status: DC | PRN
  Filled 2024-04-20: qty 20, 7d supply, fill #0

## 2024-04-20 MED ORDER — ONDANSETRON HCL 4 MG/2ML IJ SOLN
4.0000 mg | Freq: Once | INTRAMUSCULAR | Status: AC
  Administered 2024-04-20: 4 mg via INTRAVENOUS
  Filled 2024-04-20: qty 2

## 2024-04-20 MED ORDER — ALUM & MAG HYDROXIDE-SIMETH 200-200-20 MG/5ML PO SUSP: 30.0000 mL | Freq: Once | ORAL | Status: DC

## 2024-04-20 NOTE — Discharge Instructions (Addendum)
 As discussed, your workup today was overall reassuring.  Will send in medicine to take as needed for nausea as well as your abdominal discomfort.  Recommend bland diet until you are able to tolerate more complex foods.  May find over-the-counter Imodium beneficial for persistent/excessive diarrhea.  Recommend close follow-up with your primary care for reassessment.  Please do not hesitate to return if the worrisome signs and symptoms we discussed become apparent.

## 2024-04-20 NOTE — ED Provider Notes (Signed)
 [place] EMERGENCY DEPARTMENT AT [facility] Provider Note   CSN: [id number] Arrival date & time: 04/20/24  1047     History  Chief Complaint  Patient presents with   Abdominal Pain    Seen at Urgent Care this morning for N/V/D and generalized abdominal pain; sent to ER for further evaluation.  Patient states this happens periodically and usually relieved by zofran .  Given zofran  4mg  IM by EMS en route.      Sherri [name] is a 23 y.o. female.   Abdominal Pain   23 year old female presents emergency department complaints of abdominal pain, nausea, vomiting, diarrhea.  States her symptoms began this morning.  Does states that he ate fettuccine with chicken last night that was microwavable.  Went to bed not feeling well with symptom onset earlier this morning.  Was seen in urgent care and sent to the ER.  Was given dose of IM Zofran  with some improvement of symptoms.  States he has a history of similar symptoms in the past is typically relieved by Zofran .  Denies any known sick contact.  Denies any hematemesis, hematochezia/melena.  Denies any chest pain, shortness of breath, urinary symptoms, vaginal symptoms.  Presents emergency department for further assessment.  Past medical history significant for anxiety, reflux, GERD, headache, seasonal allergies, IBS, chronic low back pain with sciatica  Home Medications Prior to Admission medications   Medication Sig Start Date End Date Taking? Authorizing Provider  famotidine  (PEPCID ) 20 MG tablet Take 1 tablet (20 mg total) by mouth 2 (two) times daily as needed for heartburn or indigestion. 04/20/24  Yes Neil Balls A, PA  ondansetron  (ZOFRAN -ODT) 4 MG disintegrating tablet Take 1 tablet (4 mg total) by mouth every 8 (eight) hours as needed. 04/20/24  Yes Neil Balls A, PA  promethazine  (PHENERGAN ) 25 MG suppository Place 1 suppository (25 mg total) rectally every 6 (six) hours as needed for nausea or vomiting. 04/20/24  Yes  Neil Balls A, PA  albuterol  (VENTOLIN  HFA) 108 (90 Base) MCG/ACT inhaler Inhale 2 puffs every 4 (four) hours as needed for wheezing. 03/04/24     budesonide -formoterol  (SYMBICORT ) 160-4.5 MCG/ACT inhaler Inhale 2 puffs into the lungs 2 (two) times daily. 01/24/24   Rumball, Alison M, DO  clonazePAM  (KLONOPIN ) 0.5 MG tablet Take 2 tablets (1mg ) by mouth at bedtime. 01/10/24     dicyclomine  (BENTYL ) 10 MG capsule Take 1 capsule (10 mg total) by mouth 2 (two) times daily as needed for spasms. 11/13/23   Veronia Goon, DO  etonogestrel  (NEXPLANON ) 68 MG IMPL implant 1 each (68 mg total) by Subdermal route once.    Emanuel Handy T, FNP  linaclotide  (LINZESS ) 72 MCG capsule Take 1 capsule (72 mcg total) by mouth daily before breakfast. 10/23/23 10/17/24  Edmonia Gottron, PA-C  metFORMIN  (GLUCOPHAGE ) 500 MG tablet Take 1 tablet (500 mg total) by mouth 2 (two) times daily with a meal. 04/17/24   Veronia Goon, DO  pantoprazole  (PROTONIX ) 20 MG tablet Take 1 tablet (20 mg total) by mouth 2 (two) times daily. 12/14/23   Azell Boll, MD  Semaglutide -Weight Management (WEGOVY ) 0.5 MG/0.5ML SOAJ Inject 0.5 mg into the skin once a week. 02/06/24     Semaglutide -Weight Management (WEGOVY ) 1.7 MG/0.75ML SOAJ Inject 1.7 mg into the skin once a week. 04/16/24     Semaglutide -Weight Management (WEGOVY ) 2.4 MG/0.75ML SOAJ Inject 2.4 mg into the skin once a week. 04/16/24     triamcinolone  cream in Minerin Creme Apply 1 application  topically 2 (two) times daily. Do not use for more than 7 consecutive days 11/13/23   Veronia Goon, DO      Allergies    Ibuprofen  and Shellfish allergy    Review of Systems   Review of Systems  Gastrointestinal:  Positive for abdominal pain.  All other systems reviewed and are negative.   Physical Exam Updated Vital Signs BP 108/70   Pulse 85   Temp 98.5 F (36.9 C) (Oral)   Resp 18   Ht 5' (1.524 m)   Wt (!) 144.3 kg   LMP 12/30/2023 (Approximate)   SpO2 100%   BMI  62.13 kg/m  Physical Exam Vitals and nursing note reviewed.  Constitutional:      General: She is not in acute distress.    Appearance: She is well-developed.  HENT:     Head: Normocephalic and atraumatic.  Eyes:     Conjunctiva/sclera: Conjunctivae normal.  Cardiovascular:     Rate and Rhythm: Normal rate and regular rhythm.     Heart sounds: No murmur heard. Pulmonary:     Effort: Pulmonary effort is normal. No respiratory distress.     Breath sounds: Normal breath sounds.  Abdominal:     Palpations: Abdomen is soft.     Tenderness: There is abdominal tenderness in the epigastric area. There is no right CVA tenderness, left CVA tenderness or guarding. Negative signs include Murphy's sign and McBurney's sign.  Musculoskeletal:        General: No swelling.     Cervical back: Neck supple.  Skin:    General: Skin is warm and dry.     Capillary Refill: Capillary refill takes less than 2 seconds.  Neurological:     Mental Status: She is alert.  Psychiatric:        Mood and Affect: Mood normal.     ED Results / Procedures / Treatments   Labs (all labs ordered are listed, but only abnormal results are displayed) Labs Reviewed  CBC - Abnormal; Notable for the following components:      Result Value   MCV 79.9 (*)    MCH 25.3 (*)    RDW 16.6 (*)    Platelets 458 (*)    All other components within normal limits  URINALYSIS, ROUTINE W REFLEX MICROSCOPIC - Abnormal; Notable for the following components:   Color, Urine COLORLESS (*)    Hgb urine dipstick SMALL (*)    Bacteria, UA MANY (*)    All other components within normal limits  COMPREHENSIVE METABOLIC PANEL WITH GFR  LIPASE, BLOOD  PREGNANCY, URINE    EKG None  Radiology No results found.  Procedures Procedures    Medications Ordered in ED Medications  famotidine  (PEPCID ) tablet 20 mg (20 mg Oral Given 04/20/24 1138)  acetaminophen  (TYLENOL ) tablet 650 mg (650 mg Oral Given 04/20/24 1138)  sodium chloride  0.9  % bolus 1,000 mL (0 mLs Intravenous Stopped 04/20/24 1240)  ondansetron  (ZOFRAN ) injection 4 mg (4 mg Intravenous Given 04/20/24 1138)    ED Course/ Medical Decision Making/ A&P Clinical Course as of 04/20/24 1329  Sun Apr 20, 2024  1114 Felt bad after dinner,  [CR]    Clinical Course User Index [CR] [place] Butter, [place]                                 Medical Decision Making Amount and/or Complexity of Data Reviewed Labs: ordered.  Risk OTC drugs. Prescription drug management.   This patient presents to the ED for concern of nausea, vomiting, this involves an extensive number of treatment options, and is a complaint that carries with it a high risk of complications and morbidity.  The differential diagnosis includes gastritis, gastroparesis, CBD pathology, pancreatitis, SBO/LBO, volvulus, diverticulitis, appendicitis, IUP, hyperemesis cannabinoid syndrome, ovarian torsion, tubo-ovarian abscess, ectopic pregnancy, other other   Co morbidities that complicate the patient evaluation  See HPI   Additional history obtained:  Additional history obtained from EMR External records from outside source obtained and reviewed including hospital records   Lab Tests:  I Ordered, and personally interpreted labs.  The pertinent results include: No leukocytosis.  No evidence of anemia.  Thrombocytosis of 458.  No Electra abnormalities.  No transaminitis.  No renal dysfunction.  Urine pregnancy within normal limits.  UA with small hemoglobin, 11-20 RBCs but otherwise unremarkable.  Lipase within normal limits.   Imaging Studies ordered:  N/a   Cardiac Monitoring: / EKG:  The patient was maintained on a cardiac monitor.  I personally viewed and interpreted the cardiac monitored which showed an underlying rhythm of: sinus rhythm   Consultations Obtained:  N/a   Problem List / ED Course / Critical interventions / Medication management  Nausea, vomiting, abdominal pain I ordered  medication including Zofran , Pepcid , Tylenol , 1 L normal saline Reevaluation of the patient after these medicines showed that the patient improved I have reviewed the patients home medicines and have made adjustments as needed   Social Determinants of Health:  Denies tobacco, illicit drug use.   Test / Admission - Considered:  Nausea, vomiting, abdominal pain Vitals signs within normal range and stable throughout visit. Laboratory studies significant for: see above 23 year old female presents emergency department complaints of abdominal pain, nausea, vomiting, diarrhea.  States her symptoms began this morning.  Does states that he ate fettuccine with chicken last night that was microwavable.  Went to bed not feeling well with symptom onset earlier this morning.  Was seen in urgent care and sent to the ER.  Was given dose of IM Zofran  with some improvement of symptoms.  States he has a history of similar symptoms in the past is typically relieved by Zofran .  Denies any known sick contact.  Denies any hematemesis, hematochezia/melena.  Denies any chest pain, shortness of breath, urinary symptoms, vaginal symptoms.  Presents emergency department for further assessment. On exam, most appreciable abdominal tenderness in epigastric region.  Labs obtained unremarkable for any acute process.  Treated with IV fluids, antiemetic, Pepcid  with significant improvement of symptoms subsequent tolerance of p.o.  Serial abdominal exam benign.  Suspect patient's symptoms likely secondary to viral gastroenteritis versus foodborne illness.  Will recommend symptomatic therapy as described in AVS with close follow-up with PCP in the outpatient setting.  Treatment plan discussed with patient and she acknowledged understanding was agreeable to set plan.  Patient overall well-appearing, afebrile in no acute distress. Worrisome signs and symptoms were discussed with the patient, and the patient acknowledged understanding to  return to the ED if noticed. Patient was stable upon discharge.         Final Clinical Impression(s) / ED Diagnoses Final diagnoses:  Nausea vomiting and diarrhea    Rx / DC Orders ED Discharge Orders          Ordered    ondansetron  (ZOFRAN -ODT) 4 MG disintegrating tablet  Every 8 hours PRN        04/20/24 1245  promethazine  (PHENERGAN ) 25 MG suppository  Every 6 hours PRN        04/20/24 1245    famotidine  (PEPCID ) 20 MG tablet  2 times daily PRN        04/20/24 1245              [place] Butter, [place] 04/20/24 1329    Zackowski, Scott, MD 04/22/24 [phone]

## 2024-04-21 ENCOUNTER — Other Ambulatory Visit (HOSPITAL_COMMUNITY): Payer: Self-pay

## 2024-04-21 ENCOUNTER — Other Ambulatory Visit: Payer: Self-pay

## 2024-04-22 ENCOUNTER — Other Ambulatory Visit: Payer: Self-pay

## 2024-04-28 ENCOUNTER — Other Ambulatory Visit (HOSPITAL_COMMUNITY): Payer: Self-pay

## 2024-04-30 ENCOUNTER — Other Ambulatory Visit: Payer: Self-pay

## 2024-04-30 ENCOUNTER — Other Ambulatory Visit (HOSPITAL_COMMUNITY): Payer: Self-pay

## 2024-05-01 ENCOUNTER — Other Ambulatory Visit (HOSPITAL_COMMUNITY): Payer: Self-pay

## 2024-05-06 ENCOUNTER — Encounter (HOSPITAL_BASED_OUTPATIENT_CLINIC_OR_DEPARTMENT_OTHER): Payer: Self-pay | Admitting: Emergency Medicine

## 2024-05-06 ENCOUNTER — Other Ambulatory Visit: Payer: Self-pay

## 2024-05-06 ENCOUNTER — Other Ambulatory Visit (HOSPITAL_COMMUNITY): Payer: Self-pay

## 2024-05-06 ENCOUNTER — Emergency Department (HOSPITAL_BASED_OUTPATIENT_CLINIC_OR_DEPARTMENT_OTHER): Payer: MEDICAID

## 2024-05-06 ENCOUNTER — Emergency Department (HOSPITAL_BASED_OUTPATIENT_CLINIC_OR_DEPARTMENT_OTHER)
Admission: EM | Admit: 2024-05-06 | Discharge: 2024-05-06 | Disposition: A | Discharge status: Home / Self Care | Payer: MEDICAID | Attending: Emergency Medicine | Admitting: Emergency Medicine

## 2024-05-06 DIAGNOSIS — R1013 Epigastric pain: Secondary | ICD-10-CM | POA: Insufficient documentation

## 2024-05-06 DIAGNOSIS — J45909 Unspecified asthma, uncomplicated: Secondary | ICD-10-CM | POA: Diagnosis not present

## 2024-05-06 DIAGNOSIS — R112 Nausea with vomiting, unspecified: Secondary | ICD-10-CM | POA: Insufficient documentation

## 2024-05-06 DIAGNOSIS — Z7951 Long term (current) use of inhaled steroids: Secondary | ICD-10-CM | POA: Insufficient documentation

## 2024-05-06 DIAGNOSIS — R000 Tachycardia, unspecified: Secondary | ICD-10-CM | POA: Diagnosis not present

## 2024-05-06 DIAGNOSIS — E860 Dehydration: Secondary | ICD-10-CM | POA: Insufficient documentation

## 2024-05-06 DIAGNOSIS — R109 Unspecified abdominal pain: Secondary | ICD-10-CM | POA: Diagnosis present

## 2024-05-06 LAB — CBC WITH DIFFERENTIAL/PLATELET
Abs Immature Granulocytes: 0.02 10*3/uL (ref 0.00–0.07)
Basophils Absolute: 0 10*3/uL (ref 0.0–0.1)
Basophils Relative: 0 %
Eosinophils Absolute: 0.2 10*3/uL (ref 0.0–0.5)
Eosinophils Relative: 3 %
HCT: 39.2 % (ref 36.0–46.0)
Hemoglobin: 12.4 g/dL (ref 12.0–15.0)
Immature Granulocytes: 0 %
Lymphocytes Relative: 35 %
Lymphs Abs: 2.2 10*3/uL (ref 0.7–4.0)
MCH: 25.4 pg — ABNORMAL LOW (ref 26.0–34.0)
MCHC: 31.6 g/dL (ref 30.0–36.0)
MCV: 80.3 fL (ref 80.0–100.0)
Monocytes Absolute: 0.4 10*3/uL (ref 0.1–1.0)
Monocytes Relative: 7 %
Neutro Abs: 3.4 10*3/uL (ref 1.7–7.7)
Neutrophils Relative %: 55 %
Platelets: 388 10*3/uL (ref 150–400)
RBC: 4.88 MIL/uL (ref 3.87–5.11)
RDW: 16.8 % — ABNORMAL HIGH (ref 11.5–15.5)
WBC: 6.2 10*3/uL (ref 4.0–10.5)
nRBC: 0 % (ref 0.0–0.2)

## 2024-05-06 LAB — URINALYSIS, ROUTINE W REFLEX MICROSCOPIC
Bilirubin Urine: NEGATIVE
Glucose, UA: NEGATIVE mg/dL
Hgb urine dipstick: NEGATIVE
Ketones, ur: NEGATIVE mg/dL
Leukocytes,Ua: NEGATIVE
Nitrite: NEGATIVE
Protein, ur: NEGATIVE mg/dL
Specific Gravity, Urine: 1.021 (ref 1.005–1.030)
pH: 6.5 (ref 5.0–8.0)

## 2024-05-06 LAB — COMPREHENSIVE METABOLIC PANEL WITH GFR
ALT: 15 U/L (ref 0–44)
AST: 18 U/L (ref 15–41)
Albumin: 3.9 g/dL (ref 3.5–5.0)
Alkaline Phosphatase: 75 U/L (ref 38–126)
Anion gap: 11 (ref 5–15)
BUN: 9 mg/dL (ref 6–20)
CO2: 25 mmol/L (ref 22–32)
Calcium: 9.8 mg/dL (ref 8.9–10.3)
Chloride: 104 mmol/L (ref 98–111)
Creatinine, Ser: 0.93 mg/dL (ref 0.44–1.00)
GFR, Estimated: >60 mL/min (ref >60)
Glucose, Bld: 90 mg/dL (ref 70–99)
Potassium: 3.7 mmol/L (ref 3.5–5.1)
Sodium: 141 mmol/L (ref 135–145)
Total Bilirubin: 0.3 mg/dL (ref 0.0–1.2)
Total Protein: 7.4 g/dL (ref 6.5–8.1)

## 2024-05-06 LAB — PREGNANCY, URINE: Preg Test, Ur: NEGATIVE

## 2024-05-06 LAB — LIPASE, BLOOD: Lipase: 32 U/L (ref 11–51)

## 2024-05-06 MED ORDER — ONDANSETRON HCL 4 MG/2ML IJ SOLN
4.0000 mg | Freq: Once | INTRAMUSCULAR | Status: AC
  Administered 2024-05-06: 4 mg via INTRAVENOUS
  Filled 2024-05-06: qty 2

## 2024-05-06 MED ORDER — PROMETHAZINE HCL 25 MG PO TABS
25.0000 mg | ORAL_TABLET | Freq: Four times a day (QID) | ORAL | 0 refills | Status: DC | PRN
  Filled 2024-05-06 – 2024-06-17 (×2): qty 10, 3d supply, fill #0

## 2024-05-06 MED ORDER — IOHEXOL 350 MG/ML SOLN
100.0000 mL | Freq: Once | INTRAVENOUS | Status: AC | PRN
  Administered 2024-05-06: 75 mL via INTRAVENOUS

## 2024-05-06 MED ORDER — PROMETHAZINE HCL 25 MG RE SUPP
25.0000 mg | Freq: Four times a day (QID) | RECTAL | 0 refills | Status: DC | PRN
  Filled 2024-05-06: qty 12, 3d supply, fill #0

## 2024-05-06 MED ORDER — SODIUM CHLORIDE 0.9 % IV BOLUS
1000.0000 mL | Freq: Once | INTRAVENOUS | Status: AC
  Administered 2024-05-06: 1000 mL via INTRAVENOUS

## 2024-05-06 MED ORDER — OMEPRAZOLE 40 MG PO CPDR
40.0000 mg | DELAYED_RELEASE_CAPSULE | Freq: Every day | ORAL | 0 refills | Status: DC
  Filled 2024-05-06 – 2024-06-17 (×2): qty 30, 30d supply, fill #0

## 2024-05-06 MED ORDER — MORPHINE SULFATE (PF) 4 MG/ML IV SOLN
4.0000 mg | Freq: Once | INTRAVENOUS | Status: AC
  Administered 2024-05-06: 4 mg via INTRAVENOUS
  Filled 2024-05-06: qty 1

## 2024-05-06 NOTE — ED Notes (Signed)
 PO challenge - No N/V after apple juice... Provider informed.Sherri AasAaron [name]

## 2024-05-06 NOTE — ED Notes (Signed)
 Pt aware of the need for a urine... Unable to currently provide the sample.Marland KitchenMarland Kitchen

## 2024-05-06 NOTE — ED Notes (Signed)
 Discharge paperwork given and verbally understood.

## 2024-05-06 NOTE — ED Triage Notes (Signed)
 Pt endorse ABD pain with n/v/d that started at 0400 today. Denies fever

## 2024-05-06 NOTE — ED Provider Notes (Signed)
 [place] EMERGENCY DEPARTMENT AT [facility] Provider Note   CSN: [id number] Arrival date & time: 05/06/24  4098     History  Chief Complaint  Patient presents with   Abdominal Pain    Sherri [name] is a 23 y.o. female.  Pt is a 23 yo female with pmhx significant for asthma, gerd, obesity, gerd, pcos, and atopic dermatitis.  Pt is here for upper abd pain and n/v.  Pt said she woke up around 0400 with these sx.  She was here on 5/4 for the same.  She said her Wegovy  shot was just increased.  She denies f/c.  She has had diarrhea.         Home Medications Prior to Admission medications   Medication Sig Start Date End Date Taking? Authorizing Provider  omeprazole  (PRILOSEC) 40 MG capsule Take 1 capsule (40 mg total) by mouth daily. 05/06/24  Yes Sueellen Emery, MD  promethazine  (PHENERGAN ) 25 MG suppository Place 1 suppository (25 mg total) rectally every 6 (six) hours as needed for nausea or vomiting. 05/06/24  Yes Sueellen Emery, MD  promethazine  (PHENERGAN ) 25 MG tablet Take 1 tablet (25 mg total) by mouth every 6 (six) hours as needed for nausea or vomiting. 05/06/24  Yes Sueellen Emery, MD  albuterol  (VENTOLIN  HFA) 108 (90 Base) MCG/ACT inhaler Inhale 2 puffs every 4 (four) hours as needed for wheezing. 03/04/24     budesonide -formoterol  (SYMBICORT ) 160-4.5 MCG/ACT inhaler Inhale 2 puffs into the lungs 2 (two) times daily. 01/24/24   Rumball, Alison M, DO  clonazePAM  (KLONOPIN ) 0.5 MG tablet Take 2 tablets (1mg ) by mouth at bedtime. 01/10/24     dicyclomine  (BENTYL ) 10 MG capsule Take 1 capsule (10 mg total) by mouth 2 (two) times daily as needed for spasms. 11/13/23   Veronia Goon, DO  etonogestrel  (NEXPLANON ) 68 MG IMPL implant 1 each (68 mg total) by Subdermal route once.    Acie Acosta, FNP  famotidine  (PEPCID ) 20 MG tablet Take 1 tablet (20 mg total) by mouth 2 (two) times daily as needed for heartburn or indigestion. 04/20/24   [place] Butter, PA   linaclotide  (LINZESS ) 72 MCG capsule Take 1 capsule (72 mcg total) by mouth daily before breakfast. 10/23/23 10/17/24  Edmonia Gottron, PA-C  metFORMIN  (GLUCOPHAGE ) 500 MG tablet Take 1 tablet (500 mg total) by mouth 2 (two) times daily with a meal. 04/17/24   Veronia Goon, DO  ondansetron  (ZOFRAN -ODT) 4 MG disintegrating tablet Take 1 tablet (4 mg total) by mouth every 8 (eight) hours as needed. 04/20/24   [place] Butter, PA  promethazine  (PHENERGAN ) 25 MG suppository Place 1 suppository (25 mg total) rectally every 6 (six) hours as needed for nausea or vomiting. 04/20/24   [place] Butter, PA  Semaglutide -Weight Management (WEGOVY ) 0.5 MG/0.5ML SOAJ Inject 0.5 mg into the skin once a week. 02/06/24     Semaglutide -Weight Management (WEGOVY ) 1.7 MG/0.75ML SOAJ Inject 1.7 mg into the skin once a week. 04/16/24     Semaglutide -Weight Management (WEGOVY ) 2.4 MG/0.75ML SOAJ Inject 2.4 mg into the skin once a week. 04/16/24     triamcinolone  cream in Minerin Creme Apply 1 application topically 2 (two) times daily. Do not use for more than 7 consecutive days 11/13/23   Veronia Goon, DO      Allergies    Ibuprofen  and Shellfish allergy    Review of Systems   Review of Systems  Gastrointestinal:  Positive for abdominal pain, diarrhea, nausea and vomiting.  All other systems reviewed and are negative.   Physical Exam Updated Vital Signs BP 104/80 (BP Location: Right Arm)   Pulse 84   Temp 98.2 F (36.8 C)   Resp 18   Wt (!) 142.4 kg   LMP 12/30/2023 (Approximate)   SpO2 100%   BMI 61.32 kg/m  Physical Exam Vitals and nursing note reviewed.  Constitutional:      Appearance: She is well-developed.  HENT:     Head: Normocephalic and atraumatic.     Mouth/Throat:     Mouth: Mucous membranes are dry.  Eyes:     Extraocular Movements: Extraocular movements intact.     Pupils: Pupils are equal, round, and reactive to light.  Cardiovascular:     Rate and Rhythm: Regular rhythm.  Tachycardia present.  Pulmonary:     Effort: Pulmonary effort is normal.     Breath sounds: Normal breath sounds.  Abdominal:     General: Abdomen is flat. Bowel sounds are normal.     Palpations: Abdomen is soft.     Tenderness: There is abdominal tenderness in the epigastric area.  Skin:    General: Skin is warm and dry.     Capillary Refill: Capillary refill takes less than 2 seconds.  Neurological:     General: No focal deficit present.     Mental Status: She is alert and oriented to person, place, and time.  Psychiatric:        Mood and Affect: Mood normal.        Behavior: Behavior normal.     ED Results / Procedures / Treatments   Labs (all labs ordered are listed, but only abnormal results are displayed) Labs Reviewed  CBC WITH DIFFERENTIAL/PLATELET - Abnormal; Notable for the following components:      Result Value   MCH 25.4 (*)    RDW 16.8 (*)    All other components within normal limits  COMPREHENSIVE METABOLIC PANEL WITH GFR  LIPASE, BLOOD  URINALYSIS, ROUTINE W REFLEX MICROSCOPIC  PREGNANCY, URINE    EKG None  Radiology CT ABDOMEN PELVIS W CONTRAST Result Date: 05/06/2024 CLINICAL DATA:  Acute generalized abdominal pain. EXAM: CT ABDOMEN AND PELVIS WITH CONTRAST TECHNIQUE: Multidetector CT imaging of the abdomen and pelvis was performed using the standard protocol following bolus administration of intravenous contrast. RADIATION DOSE REDUCTION: This exam was performed according to the departmental dose-optimization program which includes automated exposure control, adjustment of the mA and/or kV according to patient size and/or use of iterative reconstruction technique. CONTRAST:  75mL OMNIPAQUE IOHEXOL 350 MG/ML SOLN COMPARISON:  None Available. FINDINGS: Lower chest: No acute abnormality. Hepatobiliary: No focal liver abnormality is seen. Status post cholecystectomy. No biliary dilatation. Pancreas: Unremarkable. No pancreatic ductal dilatation or surrounding  inflammatory changes. Spleen: Normal in size without focal abnormality. Adrenals/Urinary Tract: Adrenal glands are unremarkable. Kidneys are normal, without renal calculi, focal lesion, or hydronephrosis. Bladder is unremarkable. Stomach/Bowel: Stomach is within normal limits. Appendix appears normal. No evidence of bowel wall thickening, distention, or inflammatory changes. Vascular/Lymphatic: No significant vascular findings are present. No enlarged abdominal or pelvic lymph nodes. Reproductive: Uterus and bilateral adnexa are unremarkable. Other: No abdominal wall hernia or abnormality. No abdominopelvic ascites. Musculoskeletal: No acute or significant osseous findings. IMPRESSION: No definite abnormality seen in the abdomen and pelvis. Electronically Signed   By: Rosalene Colon M.D.   On: 05/06/2024 12:18    Procedures Procedures    Medications Ordered in ED Medications  sodium chloride  0.9 %  bolus 1,000 mL (0 mLs Intravenous Stopped 05/06/24 1021)  morphine  (PF) 4 MG/ML injection 4 mg (4 mg Intravenous Given 05/06/24 0942)  ondansetron  (ZOFRAN ) injection 4 mg (4 mg Intravenous Given 05/06/24 0941)  iohexol (OMNIPAQUE) 350 MG/ML injection 100 mL (75 mLs Intravenous Contrast Given 05/06/24 1032)    ED Course/ Medical Decision Making/ A&P                                 Medical Decision Making Amount and/or Complexity of Data Reviewed Labs: ordered. Radiology: ordered.  Risk Prescription drug management.   This patient presents to the ED for concern of abd pain, this involves an extensive number of treatment options, and is a complaint that carries with it a high risk of complications and morbidity.  The differential diagnosis includes gastritis, pancreatitis, med rxn, electrolyte abn   Co morbidities that complicate the patient evaluation  sthma, gerd, obesity, gerd, pcos, and atopic dermatitis   Additional history obtained:  Additional history obtained from epic chart  review External records from outside source obtained and reviewed including mom   Lab Tests:  I Ordered, and personally interpreted labs.  The pertinent results include:  cbc nl, cmp nl, ua nl, preg neg   Imaging Studies ordered:  I ordered imaging studies including ct abd/pelvis  I independently visualized and interpreted imaging which showed No definite abnormality seen in the abdomen and pelvis.  I agree with the radiologist interpretation   Cardiac Monitoring:  The patient was maintained on a cardiac monitor.  I personally viewed and interpreted the cardiac monitored which showed an underlying rhythm of: st   Medicines ordered and prescription drug management:  I ordered medication including ivfs/zofran /morphine   for sx  Reevaluation of the patient after these medicines showed that the patient improved I have reviewed the patients home medicines and have made adjustments as needed   Test Considered:  ct   Critical Interventions:  ivfs  Problem List / ED Course:  Abd pain and n/v:  pt is feeling much better after meds and fluids.  CT shows nothing acute.  Sx did get worse after increasing Wegovy , but she's not interested in going back down.  She is on protonix  which I will switch to prilosec to see if that helps.  She is d/c with phenergan  pills and supp.  She is to f/u with GI and has already made an appt while here.  She is stable for d/c.  Return if worse.    Reevaluation:  After the interventions noted above, I reevaluated the patient and found that they have :improved   Social Determinants of Health:  Lives at home   Dispostion:  After consideration of the diagnostic results and the patients response to treatment, I feel that the patent would benefit from discharge with outpatient f/u.          Final Clinical Impression(s) / ED Diagnoses Final diagnoses:  Epigastric pain  Dehydration  Nausea and vomiting, unspecified vomiting type    Rx /  DC Orders ED Discharge Orders          Ordered    promethazine  (PHENERGAN ) 25 MG tablet  Every 6 hours PRN        05/06/24 1248    promethazine  (PHENERGAN ) 25 MG suppository  Every 6 hours PRN        05/06/24 1248    omeprazole  (PRILOSEC) 40 MG capsule  Daily  05/06/24 1248              Sueellen Emery, MD 05/06/24 1251

## 2024-05-07 ENCOUNTER — Other Ambulatory Visit: Payer: Self-pay

## 2024-05-09 ENCOUNTER — Other Ambulatory Visit: Payer: Self-pay

## 2024-05-15 ENCOUNTER — Other Ambulatory Visit (HOSPITAL_COMMUNITY): Payer: Self-pay

## 2024-05-15 MED ORDER — CLONAZEPAM 0.5 MG PO TABS
ORAL_TABLET | ORAL | 3 refills | Status: AC
  Filled 2024-05-15 – 2024-06-17 (×3): qty 90, 30d supply, fill #0
  Filled 2024-09-12 (×2): qty 90, 30d supply, fill #1

## 2024-05-16 ENCOUNTER — Encounter: Payer: Self-pay | Admitting: Pharmacist

## 2024-05-16 ENCOUNTER — Other Ambulatory Visit: Payer: Self-pay

## 2024-05-16 ENCOUNTER — Other Ambulatory Visit (HOSPITAL_COMMUNITY): Payer: Self-pay

## 2024-05-16 MED ORDER — ONDANSETRON 8 MG PO TBDP
8.0000 mg | ORAL_TABLET | Freq: Three times a day (TID) | ORAL | 0 refills | Status: DC | PRN
  Filled 2024-05-16: qty 60, 20d supply, fill #0

## 2024-05-21 ENCOUNTER — Other Ambulatory Visit: Payer: Self-pay

## 2024-05-26 ENCOUNTER — Encounter: Payer: Self-pay | Admitting: Cardiology

## 2024-05-26 DIAGNOSIS — R072 Precordial pain: Secondary | ICD-10-CM | POA: Insufficient documentation

## 2024-05-26 NOTE — Progress Notes (Unsigned)
 Cardiology Office Note   Date:  05/26/2024   ID:  Sherri [name], DOB 08-18-2001, MRN [id number]  PCP:  Veronia Goon, DO  Cardiologist:   None Referring:  ***  No chief complaint on file.     History of Present Illness: Sherri [name] is a 23 y.o. female who presents for evaluation of precordial chest pain.  ***     Past Medical History:  Diagnosis Date    Chronic pain 02/02/2020   Abnormal vision screen 08/26/2015   Acanthosis nigricans 02/26/2020   Acid reflux    Allergy    Seasonal allergies   Anxiety    Phreesia 11/24/2020   Asthma    Atopic dermatitis 07/14/2013   Carpal tunnel syndrome, bilateral 11/21/2018   S/p bilat surgery, release   Chest pain 07/16/2019   Concussion 02/16/2021   Approx 2/15 d/t hit posterior skull against wall during tic.    Constipation 06/17/2013   Cough 01/29/2021   COVID-19 01/22/2020   Eczema    Elevated blood pressure reading 02/26/2020   Gallstones    GERD (gastroesophageal reflux disease)    Phreesia 11/24/2020   Headache    Obesity    Pain of both hip joints 11/10/2019   PCOS (polycystic ovarian syndrome) 02/26/2020   Recurrent viral infection 05/15/2022   Seasonal allergies    Tic    Toothache 11/06/2022    Past Surgical History:  Procedure Laterality Date   BILATERAL CARPAL TUNNEL RELEASE     CHOLECYSTECTOMY N/A 08/08/2017   Procedure: LAPAROSCOPIC CHOLECYSTECTOMY;  Surgeon: Verlena Glenn, MD;  Location: [facility] OR;  Service: General;  Laterality: N/A;   UPPER GASTROINTESTINAL ENDOSCOPY       Current Outpatient Medications  Medication Sig Dispense Refill   albuterol  (VENTOLIN  HFA) 108 (90 Base) MCG/ACT inhaler Inhale 2 puffs every 4 (four) hours as needed for wheezing. 18 g 0   budesonide -formoterol  (SYMBICORT ) 160-4.5 MCG/ACT inhaler Inhale 2 puffs into the lungs 2 (two) times daily. 10.2 g 3   clonazePAM  (KLONOPIN ) 0.5 MG tablet Take 2 tablets (1mg ) by mouth at bedtime. 30 tablet 3   clonazePAM   (KLONOPIN ) 0.5 MG tablet Take 1 tablet (0.5 mg total) by mouth in the morning AND 2 tablets (1 mg total) at bedtime. Total of 3 tablets daily. 90 tablet 3   dicyclomine  (BENTYL ) 10 MG capsule Take 1 capsule (10 mg total) by mouth 2 (two) times daily as needed for spasms. 60 capsule 0   etonogestrel  (NEXPLANON ) 68 MG IMPL implant 1 each (68 mg total) by Subdermal route once. 1 each 0   famotidine  (PEPCID ) 20 MG tablet Take 1 tablet (20 mg total) by mouth 2 (two) times daily as needed for heartburn or indigestion. 30 tablet 0   linaclotide  (LINZESS ) 72 MCG capsule Take 1 capsule (72 mcg total) by mouth daily before breakfast. 90 capsule 3   metFORMIN  (GLUCOPHAGE ) 500 MG tablet Take 1 tablet (500 mg total) by mouth 2 (two) times daily with a meal. 180 tablet 3   omeprazole  (PRILOSEC) 40 MG capsule Take 1 capsule (40 mg total) by mouth daily. 30 capsule 0   ondansetron  (ZOFRAN -ODT) 4 MG disintegrating tablet Take 1 tablet (4 mg total) by mouth every 8 (eight) hours as needed. 20 tablet 0   ondansetron  (ZOFRAN -ODT) 8 MG disintegrating tablet Dissolve 1 tablet (8 mg total) in mouth every 8 (eight) hours as needed for nausea. 60 tablet 0   promethazine  (PHENERGAN ) 25 MG suppository Place 1 suppository (  25 mg total) rectally every 6 (six) hours as needed for nausea or vomiting. 12 each 0   promethazine  (PHENERGAN ) 25 MG suppository Unwrap and place 1 suppository (25 mg total) rectally every 6 (six) hours as needed for nausea or vomiting. 12 each 0   promethazine  (PHENERGAN ) 25 MG tablet Take 1 tablet (25 mg total) by mouth every 6 (six) hours as needed for nausea or vomiting. 10 tablet 0   Semaglutide -Weight Management (WEGOVY ) 0.5 MG/0.5ML SOAJ Inject 0.5 mg into the skin once a week. 2 mL 0   Semaglutide -Weight Management (WEGOVY ) 1.7 MG/0.75ML SOAJ Inject 1.7 mg into the skin once a week. 3 mL 0   Semaglutide -Weight Management (WEGOVY ) 2.4 MG/0.75ML SOAJ Inject 2.4 mg into the skin once a week. 3 mL 2    triamcinolone  cream in Minerin Creme Apply 1 application topically 2 (two) times daily. Do not use for more than 7 consecutive days 454 g 1   No current facility-administered medications for this visit.    Allergies:   Ibuprofen  and Shellfish allergy    Social History:  The patient  reports that she has never smoked. She has been exposed to tobacco smoke. She has never used smokeless tobacco. She reports that she does not drink alcohol and does not use drugs.   Family History:  The patient's ***family history includes Arthritis in her maternal grandmother and mother; Asthma in her brother, mother, and sister; COPD in an other family member; Cancer in an other family member; Cervical cancer in her maternal grandmother; Depression in her brother, maternal aunt, and mother; Diabetes in her maternal aunt, maternal grandmother, maternal uncle, and mother; Heart attack in her father; Heart disease in her maternal grandmother, paternal grandmother, and another family member; Hyperlipidemia in her maternal grandmother; Hypertension in her maternal grandmother, mother, paternal grandmother, and another family member; Irritable bowel syndrome in her brother and mother; Kidney disease in her maternal grandmother and maternal uncle; Learning disabilities in her brother and sister; Mental illness in her maternal aunt; Obesity in her father and mother; Stroke in her maternal grandfather and sister.    ROS:  Please see the history of present illness.   Otherwise, review of systems are positive for {NONE DEFAULTED:18576}.   All other systems are reviewed and negative.    PHYSICAL EXAM: VS:  There were no vitals taken for this visit. , BMI There is no height or weight on file to calculate BMI. GENERAL:  Well appearing HEENT:  Pupils equal round and reactive, fundi not visualized, oral mucosa unremarkable NECK:  No jugular venous distention, waveform within normal limits, carotid upstroke brisk and symmetric, no  bruits, no thyromegaly LYMPHATICS:  No cervical, inguinal adenopathy LUNGS:  Clear to auscultation bilaterally BACK:  No CVA tenderness CHEST:  Unremarkable HEART:  PMI not displaced or sustained,S1 and S2 within normal limits, no S3, no S4, no clicks, no rubs, *** murmurs ABD:  Flat, positive bowel sounds normal in frequency in pitch, no bruits, no rebound, no guarding, no midline pulsatile mass, no hepatomegaly, no splenomegaly EXT:  2 plus pulses throughout, no edema, no cyanosis no clubbing SKIN:  No rashes no nodules NEURO:  Cranial nerves II through XII grossly intact, motor grossly intact throughout PSYCH:  Cognitively intact, oriented to person place and time    EKG:        Recent Labs: 05/06/2024: ALT 15; BUN 9; Creatinine, Ser 0.93; Hemoglobin 12.4; Platelets 388; Potassium 3.7; Sodium 141    Lipid Panel  Component Value Date/Time   CHOL 176 03/01/2023 1147   TRIG 50 03/01/2023 1147   HDL 73 03/01/2023 1147   CHOLHDL 2.4 03/01/2023 1147   CHOLHDL 2.4 08/01/2018 1632   VLDL 16 07/25/2013 0000   LDLCALC 93 03/01/2023 1147   LDLCALC 66 08/01/2018 1632      Wt Readings from Last 3 Encounters:  05/06/24 (!) 314 lb (142.4 kg)  04/20/24 (!) 318 lb 2 oz (144.3 kg)  03/24/24 (!) 318 lb 3.2 oz (144.3 kg)      Other studies Reviewed: Additional studies/ records that were reviewed today include: ***. Review of the above records demonstrates:  Please see elsewhere in the note.  ***   ASSESSMENT AND PLAN:  Chest pain:  ***   Current medicines are reviewed at length with the patient today.  The patient {ACTIONS; HAS/DOES NOT HAVE:19233} concerns regarding medicines.  The following changes have been made:  {PLAN; NO CHANGE:13088:s}  Labs/ tests ordered today include: *** No orders of the defined types were placed in this encounter.    Disposition:   FU with ***    Signed, Eilleen Grates, MD  05/26/2024 6:48 PM    [facility]

## 2024-05-27 ENCOUNTER — Encounter: Payer: Self-pay | Admitting: *Deleted

## 2024-05-28 ENCOUNTER — Ambulatory Visit: Payer: MEDICAID | Attending: Cardiology | Admitting: Cardiology

## 2024-05-28 ENCOUNTER — Encounter: Payer: Self-pay | Admitting: Cardiology

## 2024-05-28 ENCOUNTER — Other Ambulatory Visit (HOSPITAL_COMMUNITY): Payer: Self-pay

## 2024-05-28 VITALS — BP 124/83 | HR 76 | Ht 61.0 in | Wt 312.0 lb

## 2024-05-28 DIAGNOSIS — R072 Precordial pain: Secondary | ICD-10-CM | POA: Diagnosis present

## 2024-05-28 NOTE — Patient Instructions (Signed)

## 2024-05-30 NOTE — Progress Notes (Deleted)
 05/30/2024 Al[facility] Im [id number] May 31, 2001  Referring provider: Veronia Goon, DO Primary GI doctor: Dr. Elvin Hammer  ASSESSMENT AND PLAN:  Nausea and vomiting Status post cholecystectomy 03/2022 negative H. pylori IgG, CBC, c-Met, lipase, thyroid , CRP thought secondary to Ozempic /functional, currently on Wegovy  Recent ER visit 05/06/2024 for epigastric pain 05/06/2024 CT abdomen pelvis with contrast no definite abnormality normal liver status post cholecystectomy, lipase normal, normal CBC  Constipation/IBS 2023 KUB showed moderate amount of stool   Movement disorder Follows with neurology  Morbid obesity  There is no height or weight on file to calculate BMI.  -Patient has been advised to make an attempt to improve diet and exercise patterns to aid in weight loss. -Recommended diet heavy in fruits and veggies and low in animal meats, cheeses, and dairy products, appropriate calorie intake   Patient Care Team: Veronia Goon, DO as PCP - General (Family Medicine) Elene Griffes, MD as Consulting Physician (Pediatric Gastroenterology)  HISTORY OF PRESENT ILLNESS: 23 y.o. female with a past medical history listed below presents for evaluation of ***.  Last seen in the office 03/23/2022 by myself for nausea and vomiting with abdominal bloating *** Discussed the use of AI scribe software for clinical note transcription with the patient, who gave verbal consent to proceed.  History of Present Illness            She  reports that she has never smoked. She has been exposed to tobacco smoke. She has never used smokeless tobacco. She reports that she does not drink alcohol and does not use drugs.  RELEVANT GI HISTORY, IMAGING AND LABS: Results          CBC    Component Value Date/Time   WBC 6.2 05/06/2024 0948   RBC 4.88 05/06/2024 0948   HGB 12.4 05/06/2024 0948   HGB 12.9 05/25/2023 1701   HCT 39.2 05/06/2024 0948   HCT 40.4 05/25/2023 1701   PLT  388 05/06/2024 0948   PLT 354 05/25/2023 1701   MCV 80.3 05/06/2024 0948   MCV 83 05/25/2023 1701   MCH 25.4 (L) 05/06/2024 0948   MCHC 31.6 05/06/2024 0948   RDW 16.8 (H) 05/06/2024 0948   RDW 15.5 (H) 05/25/2023 1701   LYMPHSABS 2.2 05/06/2024 0948   LYMPHSABS 2.6 04/14/2021 0846   MONOABS 0.4 05/06/2024 0948   EOSABS 0.2 05/06/2024 0948   EOSABS 0.7 (H) 04/14/2021 0846   BASOSABS 0.0 05/06/2024 0948   BASOSABS 0.1 04/14/2021 0846   Recent Labs    04/20/24 1124 05/06/24 0948  HGB 12.2 12.4    CMP     Component Value Date/Time   NA 141 05/06/2024 0948   NA 139 05/25/2023 1701   K 3.7 05/06/2024 0948   CL 104 05/06/2024 0948   CO2 25 05/06/2024 0948   GLUCOSE 90 05/06/2024 0948   BUN 9 05/06/2024 0948   BUN 7 05/25/2023 1701   CREATININE 0.93 05/06/2024 0948   CREATININE 0.76 02/11/2020 1250   CALCIUM 9.8 05/06/2024 0948   PROT 7.4 05/06/2024 0948   PROT 6.7 08/14/2022 1146   ALBUMIN 3.9 05/06/2024 0948   ALBUMIN 4.0 08/14/2022 1146   AST 18 05/06/2024 0948   ALT 15 05/06/2024 0948   ALKPHOS 75 05/06/2024 0948   BILITOT 0.3 05/06/2024 0948   BILITOT <0.2 08/14/2022 1146   GFRNONAA >60 05/06/2024 0948   GFRAA NOT CALCULATED 05/24/2017 2122      Latest Ref Rng & Units 05/06/2024  9:48 AM 04/20/2024   11:24 AM 08/14/2022   11:46 AM  Hepatic Function  Total Protein 6.5 - 8.1 g/dL 7.4  7.0  6.7   Albumin 3.5 - 5.0 g/dL 3.9  3.9  4.0   AST 15 - 41 U/L 18  19  14    ALT 0 - 44 U/L 15  17  11    Alk Phosphatase 38 - 126 U/L 75  77  82   Total Bilirubin 0.0 - 1.2 mg/dL 0.3  0.3  <1.6       Current Medications:   Current Outpatient Medications (Endocrine & Metabolic):    etonogestrel  (NEXPLANON ) 68 MG IMPL implant, 1 each (68 mg total) by Subdermal route once.   metFORMIN  (GLUCOPHAGE ) 500 MG tablet, Take 1 tablet (500 mg total) by mouth 2 (two) times daily with a meal. (Patient not taking: Reported on 05/28/2024)   Current Outpatient Medications (Respiratory):     albuterol  (VENTOLIN  HFA) 108 (90 Base) MCG/ACT inhaler, Inhale 2 puffs every 4 (four) hours as needed for wheezing.   budesonide -formoterol  (SYMBICORT ) 160-4.5 MCG/ACT inhaler, Inhale 2 puffs into the lungs 2 (two) times daily.   promethazine  (PHENERGAN ) 25 MG suppository, Place 1 suppository (25 mg total) rectally every 6 (six) hours as needed for nausea or vomiting.   promethazine  (PHENERGAN ) 25 MG tablet, Take 1 tablet (25 mg total) by mouth every 6 (six) hours as needed for nausea or vomiting.    Current Outpatient Medications (Other):    clonazePAM  (KLONOPIN ) 0.5 MG tablet, Take 1 tablet (0.5 mg total) by mouth in the morning AND 2 tablets (1 mg total) at bedtime. Total of 3 tablets daily.   dicyclomine  (BENTYL ) 10 MG capsule, Take 1 capsule (10 mg total) by mouth 2 (two) times daily as needed for spasms.   famotidine  (PEPCID ) 20 MG tablet, Take 1 tablet (20 mg total) by mouth 2 (two) times daily as needed for heartburn or indigestion.   linaclotide  (LINZESS ) 72 MCG capsule, Take 1 capsule (72 mcg total) by mouth daily before breakfast.   omeprazole  (PRILOSEC) 40 MG capsule, Take 1 capsule (40 mg total) by mouth daily.   triamcinolone  cream in Minerin Creme, Apply 1 application topically 2 (two) times daily. Do not use for more than 7 consecutive days  Medical History:  Past Medical History:  Diagnosis Date    Chronic pain 02/02/2020   Acid reflux    Allergy    Seasonal allergies   Anxiety    Phreesia 11/24/2020   Asthma    Carpal tunnel syndrome, bilateral 11/21/2018   S/p bilat surgery, release   Eczema    Gallstones    GERD (gastroesophageal reflux disease)    Phreesia 11/24/2020   Obesity    PCOS (polycystic ovarian syndrome) 02/26/2020   Allergies:  Allergies  Allergen Reactions   Ibuprofen  Other (See Comments)    Chest pain   Shellfish Allergy Hives and Itching    Mouth itches, facial hives.      Surgical History:  She  has a past surgical history that  includes Cholecystectomy (N/A, 08/08/2017); Bilateral carpal tunnel release; and Upper gastrointestinal endoscopy. Family History:  Her family history includes Arthritis in her maternal grandmother and mother; Asthma in her brother, mother, and sister; COPD in an other family member; Cancer in an other family member; Cervical cancer in her maternal grandmother; Depression in her brother, maternal aunt, and mother; Diabetes in her maternal aunt, maternal grandmother, maternal uncle, and mother; Heart attack (age of onset:  42) in her father; Heart disease in her maternal grandmother, paternal grandmother, and another family member; Hyperlipidemia in her maternal grandmother; Hypertension in her maternal grandmother, mother, paternal grandmother, and another family member; Irritable bowel syndrome in her brother and mother; Kidney disease in her maternal grandmother and maternal uncle; Learning disabilities in her brother and sister; Mental illness in her maternal aunt; Obesity in her father and mother; Stroke in her maternal grandfather and sister.  REVIEW OF SYSTEMS  : All other systems reviewed and negative except where noted in the History of Present Illness.  PHYSICAL EXAM: There were no vitals taken for this visit. Physical Exam          Edmonia Gottron, PA-C 7:51 AM

## 2024-06-02 ENCOUNTER — Ambulatory Visit: Payer: MEDICAID | Admitting: Physician Assistant

## 2024-06-17 ENCOUNTER — Telehealth: Payer: Self-pay

## 2024-06-17 ENCOUNTER — Other Ambulatory Visit (HOSPITAL_COMMUNITY): Payer: Self-pay

## 2024-06-17 ENCOUNTER — Ambulatory Visit: Payer: MEDICAID

## 2024-06-17 ENCOUNTER — Other Ambulatory Visit: Payer: Self-pay

## 2024-06-17 ENCOUNTER — Encounter (HOSPITAL_COMMUNITY): Payer: MEDICAID | Admitting: Student in an Organized Health Care Education/Training Program

## 2024-06-17 NOTE — Telephone Encounter (Addendum)
 Patient calls nurse line requesting an apt.   She reports she has had a headache for ~4 days. She reports this morning she woke up and felt dizzy. She reports when she was tying her shoes she fell over. She denies LOC or hitting her head.    She reports she feels she has felt off balance for months. She denies any syncope episodes. She denies any nausea, vomiting or fevers. She denies any vision changes. No facial changes or speech changes.  Patient scheduled for this afternoon for evaluation.   Precautions discussed in the meantime.

## 2024-06-17 NOTE — Progress Notes (Deleted)
    SUBJECTIVE:   CHIEF COMPLAINT / HPI:   Headache - Occurring for ~4 days. Woke up and felt dizzy. Was tying her shoes she fell over. She denies LOC or hitting her head.     She reports she feels she has felt off balance for months. She denies any syncope episodes. She denies any nausea, vomiting or fevers. She denies any vision changes. No facial changes or speech changes.  PERTINENT  PMH / PSH: Asthma, Anxiety, Pre-diabetes  OBJECTIVE:   There were no vitals taken for this visit.  General: Awake and Alert in NAD HEENT: NCAT. Sclera anicteric. No rhinorrhea. Cardiovascular: RRR. No M/R/G Respiratory: CTAB, normal WOB on RA. No wheezing, crackles, rhonchi, or diminished breath sounds. Abdomen: Soft, non-tender, non-distended. Bowel sounds normoactive/hypoactive/hyperactive. *** Extremities: Able to move all extremities. No BLE edema, no deformities or significant joint findings. Skin: Warm and dry. No abrasions or rashes noted. Neuro: A&Ox***. No focal neurological deficits.  ASSESSMENT/PLAN:   Assessment & Plan      [name], DO [facility]

## 2024-06-23 ENCOUNTER — Other Ambulatory Visit (HOSPITAL_COMMUNITY): Payer: Self-pay

## 2024-06-23 ENCOUNTER — Telehealth (HOSPITAL_COMMUNITY): Payer: Self-pay

## 2024-06-23 ENCOUNTER — Ambulatory Visit: Payer: MEDICAID | Admitting: Student

## 2024-06-23 ENCOUNTER — Other Ambulatory Visit: Payer: Self-pay

## 2024-06-23 VITALS — BP 132/68 | HR 101 | Ht 61.0 in | Wt 319.6 lb

## 2024-06-23 DIAGNOSIS — G43009 Migraine without aura, not intractable, without status migrainosus: Secondary | ICD-10-CM

## 2024-06-23 DIAGNOSIS — J4530 Mild persistent asthma, uncomplicated: Secondary | ICD-10-CM | POA: Diagnosis not present

## 2024-06-23 DIAGNOSIS — K219 Gastro-esophageal reflux disease without esophagitis: Secondary | ICD-10-CM

## 2024-06-23 DIAGNOSIS — F319 Bipolar disorder, unspecified: Secondary | ICD-10-CM | POA: Diagnosis not present

## 2024-06-23 MED ORDER — ALBUTEROL SULFATE HFA 108 (90 BASE) MCG/ACT IN AERS
INHALATION_SPRAY | RESPIRATORY_TRACT | 0 refills | Status: DC
  Filled 2024-06-23: qty 18, 17d supply, fill #0
  Filled 2024-06-24: qty 18, 25d supply, fill #0

## 2024-06-23 MED ORDER — PANTOPRAZOLE SODIUM 40 MG PO TBEC
40.0000 mg | DELAYED_RELEASE_TABLET | Freq: Every day | ORAL | 3 refills | Status: AC
  Filled 2024-06-23 – 2024-06-24 (×2): qty 30, 30d supply, fill #0
  Filled 2024-08-11: qty 30, 30d supply, fill #1
  Filled 2024-09-12: qty 30, 30d supply, fill #2
  Filled 2024-12-16: qty 30, 30d supply, fill #3

## 2024-06-23 MED ORDER — ALBUTEROL SULFATE HFA 108 (90 BASE) MCG/ACT IN AERS
INHALATION_SPRAY | RESPIRATORY_TRACT | 0 refills | Status: DC
  Filled 2024-06-23: qty 18, fill #0

## 2024-06-23 MED ORDER — ARIPIPRAZOLE 5 MG PO TABS
5.0000 mg | ORAL_TABLET | Freq: Every day | ORAL | 0 refills | Status: DC
  Filled 2024-06-23 – 2024-06-24 (×2): qty 30, 30d supply, fill #0

## 2024-06-23 MED ORDER — BUDESONIDE-FORMOTEROL FUMARATE 160-4.5 MCG/ACT IN AERO
2.0000 | INHALATION_SPRAY | Freq: Two times a day (BID) | RESPIRATORY_TRACT | 3 refills | Status: DC
Start: 2024-06-23 — End: 2024-06-23

## 2024-06-23 MED ORDER — BUDESONIDE-FORMOTEROL FUMARATE 160-4.5 MCG/ACT IN AERO
2.0000 | INHALATION_SPRAY | Freq: Two times a day (BID) | RESPIRATORY_TRACT | 3 refills | Status: DC
  Filled 2024-06-23: qty 10.2, 30d supply, fill #0

## 2024-06-23 MED ORDER — UBRELVY 100 MG PO TABS
100.0000 mg | ORAL_TABLET | ORAL | 1 refills | Status: DC
  Filled 2024-06-23 (×2): qty 15, 30d supply, fill #0

## 2024-06-23 NOTE — Patient Instructions (Addendum)
 I am starting you back on Abilify . You will need to follow up with your psychiatrist to discuss further treatment options.   For migraines I have prescribed a new medication called Ubrelvy . You should take this every other day. You will need to stop taking the ibuprofen  and tylenol  while on this medication due to the overuse side effects.   I am reassured by the cardiologist about your chest pain.   Start taking your inhalers. I have sent a refill of both to your pharmacy. The Symbicort  should be used daily with the albuterol  only to be used as needed. If you are using your albuterol  more than 2-3 times per week please let your PCP know.    Future Appointments  Date Time Provider Department Center  07/01/2024  2:20 PM Janna Ferrier, DO [facility]  07/07/2024  9:20 AM Craig Alan SAUNDERS, PA-C [facility]  07/11/2024 10:30 AM Graham Krabbe, MD [facility] None    Please arrive 15 minutes before your appointment to ensure smooth check in process.    Please call the clinic at [phone] if your symptoms worsen or you have any concerns.  Thank you for allowing me to participate in your care, Dr. Damien Pinal [facility]

## 2024-06-23 NOTE — Assessment & Plan Note (Signed)
 Will switch patient from omeprazole  to pantoprazole  per her request.  Suspect this may help with some of the chest pain.

## 2024-06-23 NOTE — Progress Notes (Signed)
    SUBJECTIVE:   CHIEF COMPLAINT / HPI:   Sherri [name] is a 23 y.o. female presenting for recurrent chest pain, generalized fatigue and depression.   Chest Pain: Described as substernal and sometimes under her left breat that is sharp non-radiating. Does not appear to have a pattern of presentation related to activity.  Recently saw cardiology who reviewed her EKG and symptoms and said she was low risk to have cardiac related chest pain.   Depression: She follows with a psychiatrist and has an appointment with them at the end of this month. Strong family history of multiple psychiatric conditions including bipolar, schizophrenia, and severe depression. She is currently only on clonazepam  1 mg at bedtime.  Migraines: Without aura.  Have never been controlled by medication.  Has continued to have a headache for multiple weeks.  She reports she goes to bed with a headache and wakes up with a headache.  She has been taking ibuprofen  and Tylenol  without relief daily.  Has tried a triptan before without relief.  PERTINENT  PMH / PSH: reviewed and updated.  OBJECTIVE:   BP 132/68   Pulse (!) 101   Ht 5' 1 (1.549 m)   Wt (!) 319 lb 9.6 oz (145 kg)   SpO2 100%   BMI 60.39 kg/m   Well-appearing, no acute distress Cardio: Regular rate, regular rhythm, no murmurs on exam. Pulm: Clear, no wheezing, no crackles. No increased work of breathing Abdominal: bowel sounds present, soft, non-tender, non-distended Extremities: no peripheral edema  Neuro: alert and oriented x3, speech normal in content, no facial asymmetry, strength intact and equal bilaterally in UE and LE, pupils equal and reactive to light.  Psych:  Cognition and judgment appear intact. Alert, communicative  and cooperative with normal attention span and concentration. No apparent delusions, illusions, hallucinations    ASSESSMENT/PLAN:   Assessment & Plan Mild persistent chronic asthma without complication Suspect the chest  pain is related to uncontrolled asthma.  Patient has not been on her Symbicort  inhaler or albuterol  inhaler due to not picking up from pharmacy.  She is coughing on exam today no wheezing appreciated.  Discussed the importance of using her maintenance inhaler and if she continues to need to use her rescue inhaler more than 2-3 times a week to let her PCP know so we can adjust her maintenance inhaler. Gastroesophageal reflux  Will switch patient from omeprazole  to pantoprazole  per her request.  Suspect this may help with some of the chest pain. Migraine without aura and without status migrainosus, not intractable Will trial Ubrelvy  100 mg every other day for migraine prevention and medication overuse headache treatment Discussed with patient that she will need to discontinue Tylenol  and ibuprofen  use. Bipolar 1 disorder (HCC) Will restart Abilify  5 mg.  Patient reports this is the dose she was on previously which helped her from hearing voices.  They are not command voices more annoying to her.  She also endorsed thoughts of wanting to harm or kill herself.  This is irregularly and she reports she does not have a plan to cut her self currently.  Encouraged patient to keep appointment with psychiatry.  She will follow-up with us  on 7/15 for medication titration.     Damien Pinal, DO [facility]

## 2024-06-23 NOTE — Assessment & Plan Note (Addendum)
 Will restart Abilify  5 mg.  Patient reports this is the dose she was on previously which helped her from hearing voices.  They are not command voices more annoying to her.  She also endorsed thoughts of wanting to harm or kill herself.  This is irregularly and she reports she does not have a plan to cut her self currently.  Encouraged patient to keep appointment with psychiatry.  She will follow-up with us  on 7/15 for medication titration.

## 2024-06-23 NOTE — Assessment & Plan Note (Signed)
 Suspect the chest pain is related to uncontrolled asthma.  Patient has not been on her Symbicort  inhaler or albuterol  inhaler due to not picking up from pharmacy.  She is coughing on exam today no wheezing appreciated.  Discussed the importance of using her maintenance inhaler and if she continues to need to use her rescue inhaler more than 2-3 times a week to let her PCP know so we can adjust her maintenance inhaler.

## 2024-06-23 NOTE — Assessment & Plan Note (Signed)
 Will trial Ubrelvy  100 mg every other day for migraine prevention and medication overuse headache treatment Discussed with patient that she will need to discontinue Tylenol  and ibuprofen  use.

## 2024-06-24 ENCOUNTER — Other Ambulatory Visit: Payer: Self-pay

## 2024-06-24 ENCOUNTER — Telehealth (HOSPITAL_COMMUNITY): Payer: Self-pay

## 2024-06-24 ENCOUNTER — Encounter (HOSPITAL_COMMUNITY): Payer: Self-pay | Admitting: Pharmacy Technician

## 2024-06-24 ENCOUNTER — Telehealth (HOSPITAL_COMMUNITY): Payer: Self-pay | Admitting: Pharmacy Technician

## 2024-06-24 ENCOUNTER — Other Ambulatory Visit (HOSPITAL_COMMUNITY): Payer: Self-pay

## 2024-06-24 NOTE — Telephone Encounter (Signed)
 A user error has taken place: encounter opened in error, closed for administrative reasons.

## 2024-06-24 NOTE — Telephone Encounter (Signed)
 PA request has been Received. New Encounter has been or will be created for follow up. For additional info see Pharmacy Prior Auth telephone encounter from 06/24/24.

## 2024-06-24 NOTE — Telephone Encounter (Signed)
 Pharmacy Patient Advocate Encounter   Received notification from Patient Pharmacy that prior authorization for Ubrelvy  100mg  is required/requested.   Insurance verification completed.   The patient is insured through [employer] [place] .   Per test claim:  Nurtec is preferred by the insurance.  If suggested medication is appropriate, Please send in a new RX and discontinue this one. If not, please advise as to why it's not appropriate so that we may request a Prior Authorization. Please note, some preferred medications may still require a PA.  If the suggested medications have not been trialed and there are no contraindications to their use, the PA will not be submitted, as it will not be approved.   Ins won't approve Ubrelvy  for preventative, only acute treatment.

## 2024-06-24 NOTE — Telephone Encounter (Signed)
(  Duplicate request) PA request has been Received. New Encounter has been or will be created for follow up. For additional info see Pharmacy Prior Auth telephone encounter from 06/24/24.

## 2024-06-26 NOTE — Telephone Encounter (Signed)
 Hi Dr. Cleotilde, I'm on the same team as [place].   I did start the PA, but got to this question and found that Ubrelvy  isn't used as preventative: Failed two preferred drugs. If only one preferred drug is available, then failed one preferred drug (yes or no)  Medicaid's preferred preventative meds.    If I say no to that question, then there are some others that I believe the answers are also no, which bring me to this question:   Unique clinical indication supported by FDA approval or peer reviewed literature. (Yes or no)  If this answer is yes, please send me the clinical indication or peer reviewed literature that I may attach to the PA request. Thanks.

## 2024-06-30 ENCOUNTER — Other Ambulatory Visit (HOSPITAL_COMMUNITY): Payer: Self-pay

## 2024-07-01 ENCOUNTER — Ambulatory Visit: Payer: MEDICAID | Admitting: Family Medicine

## 2024-07-04 NOTE — Progress Notes (Deleted)
 07/04/2024 Sherri [name] [id number] 2001-11-15  Referring provider: Orlando Pond, DO Primary GI doctor: Dr. Abran  ASSESSMENT AND PLAN:  Nausea and vomiting 08/14/2019 EGD at [facility] showed normal esophagus, stomach and duodenum, negative EOE negative H. pylori, negative celiac, chronic inactive gastritis. 05/06/2024 CT abdomen pelvis with contrast for abdominal pain showed no abnormality, normal liver, status post cholecystectomy normal pancreas spleen unremarkable, no bowel wall thickening  Constipation On Linzess   Tic disorder Follows with [facility] neurology last seen 05/15/2024  Patient Care Team: Janna Ferrier, DO as PCP - General (Family Medicine) Leatrice Eric Cuba, MD as Consulting Physician (Pediatric Gastroenterology)  HISTORY OF PRESENT ILLNESS: 23 y.o. female with a past medical history l of  obesity, anxiety, depression, asthma, neurological tics 11/2020, vitamin D  deficiency, IBS symptoms and GERD. S/P  laparoscopic cholecystectomy for symptomatic gallstones on 08/08/17 and others listed below presents for evaluation of nausea and vomiting.   Patient was last seen 03/23/2022 by myself for nausea and vomiting.  At that time thought high probability of Ozempic  correlating with worsening symptoms.  Increase PPI to twice daily suggested follow-up with psychologist/neurologist as there is more gagging without vomiting. At that time negative CRP, H. pylori, lipase, thyroid , kidney and liver KUB showed moderate amount of stool added Linzess .  *** Discussed the use of AI scribe software for clinical note transcription with the patient, who gave verbal consent to proceed.  History of Present Illness            She  reports that she has never smoked. She has been exposed to tobacco smoke. She has never used smokeless tobacco. She reports that she does not drink alcohol and does not use drugs.  RELEVANT GI HISTORY, IMAGING AND LABS: Results          CBC     Component Value Date/Time   WBC 6.2 05/06/2024 0948   RBC 4.88 05/06/2024 0948   HGB 12.4 05/06/2024 0948   HGB 12.9 05/25/2023 1701   HCT 39.2 05/06/2024 0948   HCT 40.4 05/25/2023 1701   PLT 388 05/06/2024 0948   PLT 354 05/25/2023 1701   MCV 80.3 05/06/2024 0948   MCV 83 05/25/2023 1701   MCH 25.4 (L) 05/06/2024 0948   MCHC 31.6 05/06/2024 0948   RDW 16.8 (H) 05/06/2024 0948   RDW 15.5 (H) 05/25/2023 1701   LYMPHSABS 2.2 05/06/2024 0948   LYMPHSABS 2.6 04/14/2021 0846   MONOABS 0.4 05/06/2024 0948   EOSABS 0.2 05/06/2024 0948   EOSABS 0.7 (H) 04/14/2021 0846   BASOSABS 0.0 05/06/2024 0948   BASOSABS 0.1 04/14/2021 0846   Recent Labs    04/20/24 1124 05/06/24 0948  HGB 12.2 12.4    CMP     Component Value Date/Time   NA 141 05/06/2024 0948   NA 139 05/25/2023 1701   K 3.7 05/06/2024 0948   CL 104 05/06/2024 0948   CO2 25 05/06/2024 0948   GLUCOSE 90 05/06/2024 0948   BUN 9 05/06/2024 0948   BUN 7 05/25/2023 1701   CREATININE 0.93 05/06/2024 0948   CREATININE 0.76 02/11/2020 1250   CALCIUM 9.8 05/06/2024 0948   PROT 7.4 05/06/2024 0948   PROT 6.7 08/14/2022 1146   ALBUMIN 3.9 05/06/2024 0948   ALBUMIN 4.0 08/14/2022 1146   AST 18 05/06/2024 0948   ALT 15 05/06/2024 0948   ALKPHOS 75 05/06/2024 0948   BILITOT 0.3 05/06/2024 0948   BILITOT <0.2 08/14/2022 1146   GFRNONAA >60 05/06/2024  9051   GFRAA NOT CALCULATED 05/24/2017 2122      Latest Ref Rng & Units 05/06/2024    9:48 AM 04/20/2024   11:24 AM 08/14/2022   11:46 AM  Hepatic Function  Total Protein 6.5 - 8.1 g/dL 7.4  7.0  6.7   Albumin 3.5 - 5.0 g/dL 3.9  3.9  4.0   AST 15 - 41 U/L 18  19  14    ALT 0 - 44 U/L 15  17  11    Alk Phosphatase 38 - 126 U/L 75  77  82   Total Bilirubin 0.0 - 1.2 mg/dL 0.3  0.3  <9.7       Current Medications:   Current Outpatient Medications (Endocrine & Metabolic):    etonogestrel  (NEXPLANON ) 68 MG IMPL implant, 1 each (68 mg total) by Subdermal route once.    metFORMIN  (GLUCOPHAGE ) 500 MG tablet, Take 1 tablet (500 mg total) by mouth 2 (two) times daily with a meal. (Patient not taking: Reported on 05/28/2024)   Current Outpatient Medications (Respiratory):    albuterol  (VENTOLIN  HFA) 108 (90 Base) MCG/ACT inhaler, Inhale 2 puffs every 4 (four) hours as needed for wheezing.   budesonide -formoterol  (SYMBICORT ) 160-4.5 MCG/ACT inhaler, Inhale 2 puffs into the lungs 2 (two) times daily.   promethazine  (PHENERGAN ) 25 MG suppository, Place 1 suppository (25 mg total) rectally every 6 (six) hours as needed for nausea or vomiting.   promethazine  (PHENERGAN ) 25 MG tablet, Take 1 tablet (25 mg total) by mouth every 6 (six) hours as needed for nausea or vomiting.  Current Outpatient Medications (Analgesics):    Ubrogepant  (UBRELVY ) 100 MG TABS, Take 1 tablet (100 mg total) by mouth every other day.   Current Outpatient Medications (Other):    ARIPiprazole  (ABILIFY ) 5 MG tablet, Take 1 tablet (5 mg total) by mouth daily.   clonazePAM  (KLONOPIN ) 0.5 MG tablet, Take 1 tablet (0.5 mg total) by mouth during the day AND 2 tablets (1 mg total) at night for a total of 3 tablets daily.   dicyclomine  (BENTYL ) 10 MG capsule, Take 1 capsule (10 mg total) by mouth 2 (two) times daily as needed for spasms.   famotidine  (PEPCID ) 20 MG tablet, Take 1 tablet (20 mg total) by mouth 2 (two) times daily as needed for heartburn or indigestion.   linaclotide  (LINZESS ) 72 MCG capsule, Take 1 capsule (72 mcg total) by mouth daily before breakfast.   pantoprazole  (PROTONIX ) 40 MG tablet, Take 1 tablet (40 mg total) by mouth daily.   triamcinolone  cream in Minerin Creme, Apply 1 application topically 2 (two) times daily. Do not use for more than 7 consecutive days  Medical History:  Past Medical History:  Diagnosis Date    Chronic pain 02/02/2020   Acid reflux    Allergy    Seasonal allergies   Anxiety    Phreesia 11/24/2020   Asthma    Carpal tunnel syndrome, bilateral  11/21/2018   S/p bilat surgery, release   Eczema    Gallstones    GERD (gastroesophageal reflux disease)    Phreesia 11/24/2020   Obesity    PCOS (polycystic ovarian syndrome) 02/26/2020   Allergies:  Allergies  Allergen Reactions   Ibuprofen  Other (See Comments)    Chest pain   Shellfish Allergy Hives and Itching    Mouth itches, facial hives.      Surgical History:  She  has a past surgical history that includes Cholecystectomy (N/A, 08/08/2017); Bilateral carpal tunnel release; and Upper gastrointestinal endoscopy.  Family History:  Her family history includes Arthritis in her maternal grandmother and mother; Asthma in her brother, mother, and sister; COPD in an other family member; Cancer in an other family member; Cervical cancer in her maternal grandmother; Depression in her brother, maternal aunt, and mother; Diabetes in her maternal aunt, maternal grandmother, maternal uncle, and mother; Heart attack (age of onset: 47) in her father; Heart disease in her maternal grandmother, paternal grandmother, and another family member; Hyperlipidemia in her maternal grandmother; Hypertension in her maternal grandmother, mother, paternal grandmother, and another family member; Irritable bowel syndrome in her brother and mother; Kidney disease in her maternal grandmother and maternal uncle; Learning disabilities in her brother and sister; Mental illness in her maternal aunt; Obesity in her father and mother; Stroke in her maternal grandfather and sister.  REVIEW OF SYSTEMS  : All other systems reviewed and negative except where noted in the History of Present Illness.  PHYSICAL EXAM: There were no vitals taken for this visit. Physical Exam          Alan JONELLE Coombs, PA-C 10:35 AM

## 2024-07-07 ENCOUNTER — Ambulatory Visit: Payer: MEDICAID | Admitting: Physician Assistant

## 2024-07-08 NOTE — Progress Notes (Unsigned)
    SUBJECTIVE:   CHIEF COMPLAINT / HPI:   Migraines - Nurtec?  PERTINENT  PMH / PSH: ***  OBJECTIVE:   There were no vitals taken for this visit.  General: Awake and Alert in NAD HEENT: NCAT. Sclera anicteric. No rhinorrhea. Cardiovascular: RRR. No M/R/G Respiratory: CTAB, normal WOB on RA. No wheezing, crackles, rhonchi, or diminished breath sounds. Abdomen: Soft, non-tender, non-distended. Bowel sounds normoactive/hypoactive/hyperactive. *** Extremities: Able to move all extremities. No BLE edema, no deformities or significant joint findings. Skin: Warm and dry. No abrasions or rashes noted. Neuro: A&Ox***. No focal neurological deficits.  ASSESSMENT/PLAN:   Assessment & Plan      [name], DO [facility]

## 2024-07-09 ENCOUNTER — Other Ambulatory Visit (HOSPITAL_COMMUNITY): Payer: Self-pay

## 2024-07-09 ENCOUNTER — Encounter: Payer: Self-pay | Admitting: Pharmacist

## 2024-07-09 ENCOUNTER — Ambulatory Visit: Payer: MEDICAID | Admitting: Family Medicine

## 2024-07-09 ENCOUNTER — Other Ambulatory Visit: Payer: Self-pay

## 2024-07-09 VITALS — BP 104/75 | HR 86 | Wt 316.6 lb

## 2024-07-09 DIAGNOSIS — N62 Hypertrophy of breast: Secondary | ICD-10-CM | POA: Diagnosis not present

## 2024-07-09 DIAGNOSIS — G43009 Migraine without aura, not intractable, without status migrainosus: Secondary | ICD-10-CM

## 2024-07-09 DIAGNOSIS — J4530 Mild persistent asthma, uncomplicated: Secondary | ICD-10-CM | POA: Diagnosis not present

## 2024-07-09 MED ORDER — RIZATRIPTAN BENZOATE 10 MG PO TABS
10.0000 mg | ORAL_TABLET | Freq: Every day | ORAL | 0 refills | Status: AC
  Filled 2024-07-09: qty 12, 30d supply, fill #0

## 2024-07-09 MED ORDER — PROMETHAZINE HCL 25 MG RE SUPP
25.0000 mg | Freq: Four times a day (QID) | RECTAL | 0 refills | Status: AC | PRN
  Filled 2024-07-09: qty 12, 3d supply, fill #0

## 2024-07-09 NOTE — Assessment & Plan Note (Signed)
 Seems like the migraines are becoming recurrent.  Has tried Sumatriptan  in the past which didn't work.  - Discussed trying Rizatriptan  vs Nurtec, will proceed with Rizatriptan  10 mg daily to provide both prophylactic and acute treatment for migraines - If Rizatriptan  does not work, we will pursue Nurtec - Refilled Phenergan  suppository for nausea since the tablets worsen her nausea

## 2024-07-09 NOTE — Assessment & Plan Note (Signed)
 Asthma recently exacerbated per patient.  However has not been using albuterol  more than twice a day.  Symptoms are likely related to her lifestyle and obesity causing difficulty performing her activities and having DOE.  Has seen a cardiologist to pursue further workup with family history of heart problems, but cardiology deemed that no further workup is necessary for her anginal symptoms. - Discussed lifestyle modifications to help with weight loss including dietary changes and increasing exercise as able

## 2024-07-09 NOTE — Patient Instructions (Addendum)
 It was great to see you today! Thank you for choosing [facility] for your primary care. Sherri [name] was seen for asthma/chest pain and migraines.  Today we addressed: Asthma/chest pain: Advised lifestyle modifications (exercise and better diet) to help with weight loss as this can be contributing to your symptoms.  Cardiology did not think that there is an indication to do further workup for you at this time. Migraines: Will start rizatriptan  to see if this helps prevent/help acute headaches.  Provided 26-month supply, please let me know if this is working for you and I will refill the medication.  If this fails we can try Nurtec. Referral placed to plastic surgery for breast reduction surgery  You should return to our clinic Return if symptoms worsen or fail to improve. Please arrive 15 minutes before your appointment to ensure smooth check in process.  We appreciate your efforts in making this happen.  Thank you for allowing me to participate in your care, [name], DO 07/09/2024, 10:52 AM PGY-2, [facility]

## 2024-07-11 ENCOUNTER — Encounter (HOSPITAL_COMMUNITY): Payer: MEDICAID | Admitting: Psychiatry

## 2024-07-12 ENCOUNTER — Other Ambulatory Visit (HOSPITAL_COMMUNITY): Payer: Self-pay

## 2024-07-12 ENCOUNTER — Other Ambulatory Visit: Payer: Self-pay

## 2024-07-12 ENCOUNTER — Other Ambulatory Visit: Payer: Self-pay | Admitting: Student

## 2024-07-12 DIAGNOSIS — J4530 Mild persistent asthma, uncomplicated: Secondary | ICD-10-CM

## 2024-07-14 ENCOUNTER — Encounter: Payer: Self-pay | Admitting: Family Medicine

## 2024-07-14 ENCOUNTER — Other Ambulatory Visit (HOSPITAL_COMMUNITY): Payer: Self-pay

## 2024-07-14 MED ORDER — ALBUTEROL SULFATE HFA 108 (90 BASE) MCG/ACT IN AERS
2.0000 | INHALATION_SPRAY | RESPIRATORY_TRACT | 3 refills | Status: AC | PRN
  Filled 2024-07-14 – 2024-08-11 (×2): qty 18, 17d supply, fill #0
  Filled 2024-10-13: qty 6.7, 20d supply, fill #1

## 2024-07-14 NOTE — Progress Notes (Unsigned)
    SUBJECTIVE:   CHIEF COMPLAINT / HPI:   Pap Smear/Vaccines Patient is here for her first Pap smear.  Uses Nexplanon  as contraception.  Last sexually active a year ago.  Due for meningitis and Tdap vaccine which she is open to receiving today.  PERTINENT  PMH / PSH: Tourette's Syndrome, Anxiety, Asthma, PCOS  OBJECTIVE:   BP 130/88   Pulse 99   Ht 5' 1 (1.549 m)   Wt (!) 321 lb 12.8 oz (146 kg)   SpO2 100%   BMI 60.80 kg/m   General: Awake and Alert in NAD HEENT: NCAT. Sclera anicteric. No rhinorrhea. Cardiovascular: RRR. No M/R/G Respiratory: CTAB, normal WOB on RA. No wheezing, crackles, rhonchi, or diminished breath sounds. Abdomen: Soft, non-tender, non-distended. Bowel sounds normoactive Extremities: Able to move all extremities. No BLE edema, no deformities or significant joint findings. Skin: Warm and dry. No abrasions or rashes noted. Neuro: A&Ox 3. No focal neurological deficits. F GU: Normally developed genitalia with no external lesions or eruptions. Vagina and cervix show no lesions, inflammation, discharge or tenderness. No cystocele. Bimanual exam reveals normal sized uterus no masses appreciated, no pain with examination. Chaperoned by CMA Stacey Reaves.   ASSESSMENT/PLAN:   Assessment & Plan Cervical cancer screening Pap smear is performed today, will follow-up with results. Encounter for immunization MenB and Tdap given today.  Previously received pneumococcal vaccine, needs to be updated in chart.   [name], DO [facility]

## 2024-07-15 ENCOUNTER — Encounter: Payer: Self-pay | Admitting: Family Medicine

## 2024-07-15 ENCOUNTER — Ambulatory Visit (INDEPENDENT_AMBULATORY_CARE_PROVIDER_SITE_OTHER): Payer: MEDICAID | Admitting: Family Medicine

## 2024-07-15 ENCOUNTER — Other Ambulatory Visit (HOSPITAL_COMMUNITY)
Admission: RE | Admit: 2024-07-15 | Discharge: 2024-07-15 | Disposition: A | Discharge status: Home / Self Care | Payer: MEDICAID | Source: Ambulatory Visit | Attending: Family Medicine | Admitting: Family Medicine

## 2024-07-15 VITALS — BP 130/88 | HR 99 | Ht 61.0 in | Wt 321.8 lb

## 2024-07-15 DIAGNOSIS — Z124 Encounter for screening for malignant neoplasm of cervix: Secondary | ICD-10-CM

## 2024-07-15 DIAGNOSIS — Z23 Encounter for immunization: Secondary | ICD-10-CM | POA: Diagnosis not present

## 2024-07-15 NOTE — Patient Instructions (Addendum)
 It was great to see you today! Thank you for choosing [facility] for your primary care. Sherri [name] was seen for pap smear and vaccines.  Today we addressed: Vaccines - meningitis and tdap vaccine given today Pap smear - performed today will follow up with results through MyChart  We are checking some labs today. I will send you a MyChart message with your results, per your preference. If you do not hear about your labs in the next 2 weeks, please call the office.  You should return to our clinic Return if symptoms worsen or fail to improve. Please arrive 15 minutes before your appointment to ensure smooth check in process.  We appreciate your efforts in making this happen.  Thank you for allowing me to participate in your care, [name], DO 07/15/2024, 3:10 PM PGY-2, [facility]

## 2024-07-20 LAB — CYTOLOGY - PAP
Adequacy: ABSENT
Diagnosis: NEGATIVE

## 2024-07-21 ENCOUNTER — Ambulatory Visit: Payer: Self-pay | Admitting: Family Medicine

## 2024-07-22 ENCOUNTER — Telehealth: Payer: Self-pay

## 2024-07-22 NOTE — Telephone Encounter (Signed)
 Closing the loop & signing this encounter. Looks like pt was seen and is trying a different medication for the time being. Will revisit if that doesn't work.

## 2024-07-22 NOTE — Telephone Encounter (Signed)
 Received message from front office regarding scheduling of patient.   Asthma and chest pain. My pulse is still elevated and I'm still experiencing trouble breathing with even the smallest movement. It makes doing task difficult. My chest starts feeling heavy and I get sharp pains every other second. I'm even waking up to those sharp pains now.   Called patient. She reports that this has been going on intermittently for the last two months. She states that she has also been unable to donate plasma due to her heart rate being elevated.   She states that she will have episodes of palpitations with movement. Generally does not feel this while resting.   She is not experiencing current shortness of breath or chest pain.   Offered to schedule patient same day appointment or appointment later this week.   She declines and states that she would only be able to come in next week due to transportation. Also advised patient to reach out to cardiologist as well. Patient voices understanding.   ED precautions discussed.   Chiquita JAYSON English, RN

## 2024-07-23 ENCOUNTER — Other Ambulatory Visit (HOSPITAL_COMMUNITY): Payer: Self-pay

## 2024-07-27 ENCOUNTER — Encounter: Payer: Self-pay | Admitting: Family Medicine

## 2024-07-28 ENCOUNTER — Ambulatory Visit: Payer: MEDICAID

## 2024-07-31 NOTE — Progress Notes (Signed)
 Breast Reduction Consultation Note  Chief Complaint: Macromastia     History of Present Illness:  Subjective Sherri [name] is a 23 y.o. female presenting for an initial visit to discuss breast reduction.  She has a long history of macromastia beginning with puberty and worsening with child-bearing. She has the following symptoms which she attributes to her large breasts: long standing upper back, neck, and shoulder pain worse at the end of the day and relieved only by removal of her bra and lying flat; painful shoulder grooving associated with her brassiere; and inframammary rashes caused by friction and skin-to-skin contact beneath her breasts. She has tried many types of supportive bras and none provide her with relief. Over the counter tylenol  and ibuprofen  do not alleviate her back, neck, and shoulder pain. She has tried powders and creams which reduce pain but do not prevent inframammary rashes. She reports difficulty exercising due to her large breasts, which prevent high-impact exercises like running or jumping. She desires reduction in breast volume for reduction of symptoms and improvement in her overall health and quality of life. Bra size is H and this has been stable for years and has not changed with weight changes. Her weight has been fluctuating over the course of the past year ~20-30 lbs. BMI is 61. She previously tried GLP-1 inhibitors but was unable to tolerate. She is interested in bariatric surgery but has been advised to reduce her BMI before she is a candidate.   She denies any personal or family history of breast cancer. Denies breast masses, swollen nodes, or nipple discharge. She denies nicotine use.   She is interested in a significant reduction of her breast volume for alleviation of symptoms. She would like to be in the D cup range.     Past Medical History:  Medical History[1]  Past Surgical History: Surgical History[2]  Social History: Social History   Tobacco Use   . Smoking status: Never    Passive exposure: Never  . Smokeless tobacco: Never  Substance Use Topics  . Alcohol use: Never    Family History: family history includes Cervical cancer in her maternal grandmother.  Allergies: Allergies[3]  Medications: has a current medication list which includes the following prescription(s): albuterol , albuterol  hfa, budesonide -formoterol , clonazepam , dicyclomine , nexplanon , famotidine , ibuprofen , linzess , metformin , pantoprazole , promethazine , albuterol  hfa, aripiprazole , cholecalciferol , clonidine , cyanocobalamin , cyclobenzaprine , escitalopram , hydroxyzine , ibuprofen , lybalvi , montelukast , ondansetron , ozempic , pantoprazole , wegovy , sertraline , sertraline , sumatriptan , trazodone , and triamcinolone  acetonide.  Review of Systems: Pertinent items are noted in HPI.  Physical Examination:  height is 1.549 m (5' 1) and weight is 147 kg (323 lb 12.8 oz) (abnormal). Her skin temperature is 99.6 F (37.6 C). Her blood pressure is 125/79 and her pulse is 83.  Body mass index is 61.18 kg/m.   Gen: well nourished female appears stated age Psych: Normal behavior and affect HEENT: Normocephalic Resp: normal respiratory effort on room air  Skin: No suspicious lesions, good skin quality.   Breast: Deferred today  Assessment/Plan  23 y.o. female with bilateral symptomatic macromastia  --The patient complains of back, neck, and shoulder pain and recurrent inframammary rashes that cause her significant distress. Based on her history and examination, she would undoubtedly benefit from breast reduction. I feel this would improve both symptoms and quality of life.   - I discussed breast reduction in detail with the patient. The primary goal of surgery would be symptom reduction. Incisional patterns vary but most often involve wise-pattern inverted T with circumareolar incisions. Preoperative requirements were discussed  including abstinence from nicotine  products, weight/breast size stability for >6 months, and a mammogram depending on the patient's age and history. Details of surgical approach and perioperative course were discussed, including the possibility of observation in the hospital, the possibility of surgical drains, the need for post-operative compression garments, and a 4-6 week recovery period.  Risks of surgery were thoroughly discussed, inluding bleeding, infection, seroma, fat necrosis, need for emergent reoperation, damage to other nerves and vessel structures, difficulty or inability to breast feed in the future, nipple ischemia leading to partial or complete loss, scarring, wound formation, the possible need for prolonged local wound care, asymmetries, need for revision operations, numbness/sensory disturbances of the breast skin and/or nipple, and dissatisfaction with with cosmetic result. I discussed additionally that free nipple grafting may be required if the vascularity of the nipple is felt to be inadequate pre-operatively or intra-operatively. I discussed the possibility that surgical dissection or pathological exam may reveal cancerous or pre-cancerous lesions that may require oncologic treatment, and that this would be discussed with the patient at a post-operative appointment. I additionally reviewed the possibility that post-operative changes in the breasts after a breast reduction may increase the frequency of diagnostic procedures such as breast biopsy; however, it does not increase the risk for future breast cancer in the affected breasts. After breast reduction, normal breast cancer screening should resume annually or as recommended by a primary care provider.   The patient expressed understanding.   At this time, I have recommended she attempt medical or surgical weight loss to BMI < 40.0 before she is a candidate for elective surgery. I discussed increased risk associated with her current BMI is prohibitively high and risk  would be greatly reduced if she is able to lose weight. She seems motivated to do so. She has a plan to contact her PCP and bariatric surgery in the near future.    She can follow up with me to continue our conversation when above criteria are met. Will proceed with surgical planning at that time.   Electronically signed by: Lang JAYSON Shadow, MD 07/31/2024 2:09 PM       [1] Past Medical History: Diagnosis Date  . Asthma (CMD)   . Depression with anxiety 08/01/2018  . GERD (gastroesophageal reflux disease)   . Moderate persistent asthma (CMD) 02/10/2020  . PCOS (polycystic ovarian syndrome) 02/26/2020  . Tic disorder, unspecified 08/10/2020   Last Assessment & Plan:  Formatting of this note might be different from the original. Recently diagnosed with Tourette's syndrome. Previously routinely seen by peds neuro and peds psychology.  - referral for adult neuro - referral for adult psych  [2] Past Surgical History: Procedure Laterality Date  . CARPAL TUNNEL RELEASE     Procedure: CARPAL TUNNEL RELEASE  . CHOLECYSTECTOMY     Procedure: CHOLECYSTECTOMY  [3] Allergies Allergen Reactions  . Shellfish Containing Products Anaphylaxis, Hives and Itching    Mouth itches, facial hives. , Mouth itches, facial hives.   . Ibuprofen  Other (See Comments)    Chest pain  . Tree Nuts

## 2024-08-09 NOTE — Progress Notes (Deleted)
 BH MD Outpatient Progress Note  08/09/2024 9:13 AM Sherri [name]  MRN:  [id number]  Assessment:  Sherri [name] presents for follow-up evaluation. Today, 08/09/24, patient reports ***  The risks/benefits/side-effects/alternatives to this medication were discussed in detail with the patient and time was given for questions. The patient consents to medication trial.   Identifying Information: Sherri [name] is a 23 y.o. female with a history of GAD, PTSD, MDD who is an established patient with [facility] for management of medications.  Risk Assessment: An assessment of suicide and violence risk factors was performed as part of this evaluation and is not significantly changed from the last visit.             While future psychiatric events cannot be accurately predicted, the patient does not currently require acute inpatient psychiatric care and does not currently meet [facility]  involuntary commitment criteria.          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   Patient and plan of care will be discussed with the Attending MD ,Dr. ***, who agrees with the above statement and plan.   Subjective:  Chief Complaint: No chief complaint on file.   Interval History: *** Last seen by Dr. Juleen 03/2024 Interval notes / No shows: patient cancelled appointment 07/11/24 -03/2024: went to [facility] for weight mgmt, started on wegovy  -04/2024: went to ED for abdominal pain, suspected 2/2 viral gastroenteritis. Went to ED again for  abd pain and got CTAP which showed nothing acute. Switched her from protonix  to prilosec.   04/2024: saw [facility] neurologist. Increased klonopin  to 1mg  at bedtime. Discontinued abilify  2/2 no hallucinations.  -05/2024: saw cardiology for precordial chest pain, no further w/u. EKG ordered but not obtained -06/2024: saw PCP, suspected chest pain due to asthma. Migraines trial ubrelvy . Restarted on abilify  5mg  due to hearing voices, not command. Also having some passive SI. Saw Pcp again 07/09/2024 and trial of rizatriptan . Also went to PCP for pap smear. Using nexplanon  for contraception. Last sexually active 1 year ago.  07/2024: went to see plastic surgeon who recommended wt loss to BMI <40 prior to elective breast reduction surgery.  Labs: 04/2024 CBC, CMP stable. 12/2023 A1c prediabetes. 02/2023 lipid panel wnl.  PDMP: klonopin  0.5mg  90# 30 days last filled 06/17/24, rx by [facility] neurologist EKG: 05/2023 qtc 413 MRI brain / EEG: 12/2022 MRI brain wnl. Sleep study: home sleep study ordered but not completed.  Tics: neck movements, body (hit self), scream phrases and sounds, holding breath  -hx of manic episodes inconclusive. Starting [employer] with focus on arts.  [ ]  sleep [ ]  appetite  [ ]  medication side effects  [ ]  mood  [ ]  stressors  [ ]  substance use  [ ]  safety    Visit Diagnosis: No diagnosis found.  Past Psychiatric History:  Diagnoses: Bipolar disorder  Medication trials: risperdal , abilify , trazodone , zoloft , wellbutrin , prozac , amitryptyline, cymbalta  Previous psychiatrist/therapist: Dr. Juleen  Hospitalizations: *** Suicide attempts: *** SIB: *** Hx of violence towards others: ***  Current access to guns: *** Hx of trauma/abuse: *** Developmental history: patient was born full-term, developmentally normal,   Initial note: per Sherri [name] Patient endorses symptoms of anxiety, depression, and hypomania. Patient informed writer that Abilify  was ineffective in managing her  mood, psychosis, or Tourette's.  Today she is agreeable to starting Lybalvi  10 mg daily to help manage mood.  Patient given a 2-week sample of the medication. 1 Bipolar disorder, current episode depressed, severe, with psychotic features (HCC) START- OLANZapine -Samidorphan (LYBALVI ) 10-10 MG TABS; Take 10 mg by mouth at bedtime.  Dispense: 30 tablet; Refill: 3 3. Generalized anxiety disorder START- escitalopram  (LEXAPRO ) 10 MG tablet; Take 1 tablet (10 mg total) by mouth daily.  Dispense: 30 tablet; Refill: 3  Substance Use History:  EtOH:   Nicotine:   THC/CBD:  IV drug use:  Stimulants:  Opiates:  Sedative/hypnotics:  Hallucinogens:  Seizures:   Past Medical History:  Past Medical History:  Diagnosis Date    Chronic pain 02/02/2020   Acid reflux    Allergy    Seasonal allergies   Anxiety    Phreesia 11/24/2020   Asthma    Carpal tunnel syndrome, bilateral 11/21/2018   S/p bilat surgery, release   Eczema    Gallstones    GERD (gastroesophageal reflux disease)    Phreesia 11/24/2020   Obesity    PCOS (polycystic ovarian syndrome) 02/26/2020    Past Surgical History:  Procedure Laterality Date   BILATERAL CARPAL TUNNEL RELEASE     CHOLECYSTECTOMY N/A 08/08/2017   Procedure: LAPAROSCOPIC CHOLECYSTECTOMY;  Surgeon: Chuckie Casimiro KIDD, MD;  Location: [facility] OR;  Service: General;  Laterality: N/A;   UPPER GASTROINTESTINAL ENDOSCOPY     LMP: Contraception:  Family Psychiatric History:  Mother depression, brother depression, Sister depression, 2 brother learning disability, a maternal aunt depression   Family History:  Family History  Problem Relation Age of Onset   Asthma Mother    Arthritis Mother    Depression Mother    Diabetes Mother    Hypertension Mother    Obesity Mother    Irritable bowel syndrome Mother    Obesity Father    Heart attack Father 48       MI   Asthma Sister    Learning disabilities Sister    Stroke Sister    Asthma Brother    Depression  Brother    Learning disabilities Brother    Irritable bowel syndrome Brother    Arthritis Maternal Grandmother    Diabetes Maternal Grandmother    Heart disease Maternal Grandmother    Kidney disease Maternal Grandmother    Hypertension Maternal Grandmother    Hyperlipidemia Maternal Grandmother    Cervical cancer Maternal Grandmother    Stroke Maternal Grandfather    Heart disease Paternal Grandmother    Hypertension Paternal Grandmother    Depression Maternal Aunt    Diabetes Maternal Aunt    Mental illness Maternal Aunt    Diabetes Maternal Uncle    Kidney disease Maternal Uncle    Cancer Other    COPD Other    Heart disease Other    Hypertension Other    Colon cancer Neg Hx    Esophageal cancer Neg Hx    Pancreatic cancer Neg Hx    Stomach cancer Neg Hx     Social History:  Academic/Vocational: *** Housing:  Income: Family: Support:  Children:  Marital Status  Substance Use History:   Social History   Socioeconomic History   Marital status: Single  Spouse name: Not on file   Number of children: 0   Years of education: 48   Highest education level: Some college, no degree  Occupational History   Occupation: Furniture conservator/restorer to college   Occupation: Seeking disability  Tobacco Use   Smoking status: Never    Passive exposure: Yes   Smokeless tobacco: Never   Tobacco comments:    mom stopped smoking  Vaping Use   Vaping status: Never Used  Substance and Sexual Activity   Alcohol use: No   Drug use: No   Sexual activity: Never  Other Topics Concern   Not on file  Social History Narrative   Lives with Mom and some siblings, some of whom have children of their own, dropped out of [employer]. plans to start [employer] for GED.   Right-handed.   No daily use of caffeine.    Social Drivers of Health   Financial Resource Strain: Medium Risk (04/15/2024)   Received from [facility]   Overall Financial Resource Strain (CARDIA)    Difficulty of Paying Living Expenses:  Somewhat hard  Food Insecurity: Food Insecurity Present (04/15/2024)   Received from [facility]   Hunger Vital Sign    Within the past 12 months, you worried that your food would run out before you got the money to buy more.: Sometimes true    Within the past 12 months, the food you bought just didn't last and you didn't have money to get more.: Sometimes true  Transportation Needs: Unmet Transportation Needs (04/15/2024)   Received from [facility] - Transportation    Lack of Transportation (Medical): Yes    Lack of Transportation (Non-Medical): No  Physical Activity: Insufficiently Active (04/15/2024)   Received from [facility]   Exercise Vital Sign    On average, how many days per week do you engage in moderate to strenuous exercise (like a brisk walk)?: 1 day    On average, how many minutes do you engage in exercise at this level?: 20 min  Stress: Stress Concern Present (04/15/2024)   Received from [facility] - Occupational Stress Questionnaire    Feeling of Stress : To some extent  Social Connections: Somewhat Isolated (04/15/2024)   Received from [facility]   Social Network    How would you rate your social network (family, work, friends)?: Restricted participation with some degree of social isolation    Allergies:  Allergies  Allergen Reactions   Ibuprofen  Other (See Comments)    Chest pain   Shellfish Allergy Hives and Itching    Mouth itches, facial hives.     Current Medications: Current Outpatient Medications  Medication Sig Dispense Refill   albuterol  (VENTOLIN  HFA) 108 (90 Base) MCG/ACT inhaler Inhale 2 puffs into the lungs every 4 (four) hours as needed for wheezing. 18 g 3   ARIPiprazole  (ABILIFY ) 5 MG tablet Take 1 tablet (5 mg total) by mouth daily. 30 tablet 0   budesonide -formoterol  (SYMBICORT ) 160-4.5 MCG/ACT inhaler Inhale 2 puffs into the lungs 2 (two) times daily. 10.2 g 3   clonazePAM   (KLONOPIN ) 0.5 MG tablet Take 1 tablet (0.5 mg total) by mouth during the day AND 2 tablets (1 mg total) at night for a total of 3 tablets daily. 90 tablet 3   dicyclomine  (BENTYL ) 10 MG capsule Take 1 capsule (10 mg total) by mouth 2 (two) times daily as needed for spasms. 60 capsule 0   etonogestrel  (NEXPLANON ) 68  MG IMPL implant 1 each (68 mg total) by Subdermal route once. 1 each 0   famotidine  (PEPCID ) 20 MG tablet Take 1 tablet (20 mg total) by mouth 2 (two) times daily as needed for heartburn or indigestion. 30 tablet 0   linaclotide  (LINZESS ) 72 MCG capsule Take 1 capsule (72 mcg total) by mouth daily before breakfast. (Patient taking differently: Take 72 mcg by mouth as needed.) 90 capsule 3   metFORMIN  (GLUCOPHAGE ) 500 MG tablet Take 1 tablet (500 mg total) by mouth 2 (two) times daily with a meal. 180 tablet 3   pantoprazole  (PROTONIX ) 40 MG tablet Take 1 tablet (40 mg total) by mouth daily. 30 tablet 3   promethazine  (PHENERGAN ) 25 MG suppository Place 1 suppository (25 mg total) rectally every 6 (six) hours as needed for nausea or vomiting. 12 each 0   rizatriptan  (MAXALT ) 10 MG tablet Take 1 tablet (10 mg total) by mouth daily. May repeat in 2 hours if needed 90 tablet 0   triamcinolone  cream in Minerin Creme Apply 1 application topically 2 (two) times daily. Do not use for more than 7 consecutive days 454 g 1   No current facility-administered medications for this visit.    ROS: Review of Systems  Objective:  Psychiatric Specialty Exam: There were no vitals taken for this visit.There is no height or weight on file to calculate BMI.  General Appearance: {Appearance:22683}  Eye Contact:  {BHH EYE CONTACT:22684}  Speech:  {Speech:22685}  Volume:  {Volume (PAA):22686}  Mood:  {BHH MOOD:22306}  Affect:  {Affect (PAA):22687}  Thought Content: {Thought Content:22690}   Suicidal Thoughts:  {ST/HT (PAA):22692}  Homicidal Thoughts:  {ST/HT (PAA):22692}  Thought Process:  {Thought  Process (PAA):22688}  Orientation:  {BHH ORIENTATION (PAA):22689}    Memory: Grossly intact ***  Judgment:  {Judgement (PAA):22694}  Insight:  {Insight (PAA):22695}  Concentration:  {Concentration:21399}  Recall: not formally assessed ***  Fund of Knowledge: {BHH GOOD/FAIR/POOR:22877}  Language: {BHH GOOD/FAIR/POOR:22877}  Psychomotor Activity:  {Psychomotor (PAA):22696}  Akathisia:  {BHH YES OR NO:22294}  AIMS (if indicated): {Desc; done/not:10129}  Assets:  {Assets (PAA):22698}  ADL's:  {BHH JIO'D:77709}  Cognition: {chl bhh cognition:304700322}  Sleep:  {BHH GOOD/FAIR/POOR:22877}   PE: General: well-appearing; no acute distress *** Pulm: no increased work of breathing on room air *** Strength & Muscle Tone: {desc; muscle tone:32375} Neuro: no focal neurological deficits observed *** Gait & Station: {PE GAIT ED WJUO:77474}  Metabolic Disorder Labs: Lab Results  Component Value Date   HGBA1C 5.8 (A) 12/21/2023   MPG 123 02/11/2020   MPG 126 07/18/2019   Lab Results  Component Value Date   PROLACTIN 7.0 05/25/2023   PROLACTIN 12.2 07/18/2019   Lab Results  Component Value Date   CHOL 176 03/01/2023   TRIG 50 03/01/2023   HDL 73 03/01/2023   CHOLHDL 2.4 03/01/2023   VLDL 16 07/25/2013   LDLCALC 93 03/01/2023   LDLCALC 79 01/27/2021   Lab Results  Component Value Date   TSH 0.450 05/25/2023   TSH 0.820 01/11/2023    Therapeutic Level Labs: No results found for: LITHIUM No results found for: VALPROATE No results found for: CBMZ  Screenings:  AIMS    Flowsheet Row Clinical Support from 07/27/2023 in [facility]  AIMS Total Score 4   GAD-7    Flowsheet Row Video Visit from 09/06/2022 in [facility] Clinical Support from 01/30/2022 in [facility] Office Visit from 11/03/2021 in [place]  [facility] Office Visit from 06/08/2020 in [facility] Video Visit from 02/10/2020 in [facility]  Total GAD-7 Score 13 19 18 12 21    PHQ2-9    Flowsheet Row Office Visit from 07/15/2024 in [facility] [place]. [facility] Office Visit from 07/09/2024 in [facility] [place]. [facility] Office Visit from 06/23/2024 in [facility] - [facility] Dept Of [place]. [facility] Office Visit from 03/24/2024 in [facility] - [facility] Dept Of [place]. [facility] Office Visit from 05/25/2023 in [facility] - [facility] Dept Of [place]. [facility]  PHQ-2 Total Score 1 1 4  0 5  PHQ-9 Total Score 8 9 18 3 19    Flowsheet Row ED from 05/06/2024 in [facility] at [facility] ED from 04/20/2024 in [facility] Emergency Department at [facility] ED from 02/04/2023 in [facility] Emergency Department at [facility]  C-SSRS RISK CATEGORY No Risk No Risk No Risk    Collaboration of Care: Collaboration of Care: [facility] OP Collaboration of Rjmz:[id number]}  Patient/Guardian was advised Release of Information must be obtained prior to any record release in order to collaborate their care with an outside provider. Patient/Guardian was advised if they have not already done so to contact the registration department to sign all necessary forms in order for us  to release information regarding their care.   Consent: Patient/Guardian gives verbal consent for treatment and assignment of benefits for services provided during this visit. Patient/Guardian expressed understanding and agreed to proceed.   Corean Minor, MD, PGY-3 08/09/2024, 9:13 AM

## 2024-08-12 ENCOUNTER — Other Ambulatory Visit (HOSPITAL_COMMUNITY): Payer: Self-pay

## 2024-08-13 ENCOUNTER — Emergency Department (HOSPITAL_BASED_OUTPATIENT_CLINIC_OR_DEPARTMENT_OTHER)
Admission: EM | Admit: 2024-08-13 | Discharge: 2024-08-13 | Disposition: A | Discharge status: Home / Self Care | Payer: MEDICAID | Attending: Emergency Medicine | Admitting: Emergency Medicine

## 2024-08-13 ENCOUNTER — Other Ambulatory Visit (HOSPITAL_BASED_OUTPATIENT_CLINIC_OR_DEPARTMENT_OTHER): Payer: Self-pay

## 2024-08-13 ENCOUNTER — Encounter (HOSPITAL_BASED_OUTPATIENT_CLINIC_OR_DEPARTMENT_OTHER): Payer: Self-pay

## 2024-08-13 ENCOUNTER — Other Ambulatory Visit: Payer: Self-pay

## 2024-08-13 DIAGNOSIS — R197 Diarrhea, unspecified: Secondary | ICD-10-CM | POA: Diagnosis not present

## 2024-08-13 DIAGNOSIS — R112 Nausea with vomiting, unspecified: Secondary | ICD-10-CM | POA: Diagnosis not present

## 2024-08-13 DIAGNOSIS — R109 Unspecified abdominal pain: Secondary | ICD-10-CM | POA: Insufficient documentation

## 2024-08-13 LAB — CBC
HCT: 38.1 % (ref 36.0–46.0)
Hemoglobin: 12.1 g/dL (ref 12.0–15.0)
MCH: 25.1 pg — ABNORMAL LOW (ref 26.0–34.0)
MCHC: 31.8 g/dL (ref 30.0–36.0)
MCV: 78.9 fL — ABNORMAL LOW (ref 80.0–100.0)
Platelets: 327 K/uL (ref 150–400)
RBC: 4.83 MIL/uL (ref 3.87–5.11)
RDW: 16.1 % — ABNORMAL HIGH (ref 11.5–15.5)
WBC: 8.7 K/uL (ref 4.0–10.5)
nRBC: 0 % (ref 0.0–0.2)

## 2024-08-13 LAB — COMPREHENSIVE METABOLIC PANEL WITH GFR
ALT: 16 U/L (ref 0–44)
AST: 17 U/L (ref 15–41)
Albumin: 4 g/dL (ref 3.5–5.0)
Alkaline Phosphatase: 75 U/L (ref 38–126)
Anion gap: 11 (ref 5–15)
BUN: 9 mg/dL (ref 6–20)
CO2: 22 mmol/L (ref 22–32)
Calcium: 9.7 mg/dL (ref 8.9–10.3)
Chloride: 107 mmol/L (ref 98–111)
Creatinine, Ser: 0.77 mg/dL (ref 0.44–1.00)
GFR, Estimated: >60 mL/min (ref >60)
Glucose, Bld: 89 mg/dL (ref 70–99)
Potassium: 4.5 mmol/L (ref 3.5–5.1)
Sodium: 140 mmol/L (ref 135–145)
Total Bilirubin: 0.2 mg/dL (ref 0.0–1.2)
Total Protein: 7.3 g/dL (ref 6.5–8.1)

## 2024-08-13 LAB — LIPASE, BLOOD: Lipase: 26 U/L (ref 11–51)

## 2024-08-13 MED ORDER — ONDANSETRON HCL 4 MG/2ML IJ SOLN
4.0000 mg | Freq: Once | INTRAMUSCULAR | Status: AC
  Administered 2024-08-13: 4 mg via INTRAVENOUS
  Filled 2024-08-13: qty 2

## 2024-08-13 MED ORDER — ONDANSETRON HCL 4 MG PO TABS
4.0000 mg | ORAL_TABLET | Freq: Four times a day (QID) | ORAL | 0 refills | Status: DC
  Filled 2024-08-13: qty 12, 3d supply, fill #0

## 2024-08-13 MED ORDER — SODIUM CHLORIDE 0.9 % IV BOLUS
1000.0000 mL | Freq: Once | INTRAVENOUS | Status: AC
  Administered 2024-08-13: 1000 mL via INTRAVENOUS

## 2024-08-13 MED ORDER — MORPHINE SULFATE (PF) 4 MG/ML IV SOLN
4.0000 mg | Freq: Once | INTRAVENOUS | Status: AC
  Administered 2024-08-13: 4 mg via INTRAVENOUS
  Filled 2024-08-13: qty 1

## 2024-08-13 MED ORDER — ONDANSETRON 4 MG PO TBDP
8.0000 mg | ORAL_TABLET | Freq: Once | ORAL | Status: DC
  Filled 2024-08-13: qty 2

## 2024-08-13 NOTE — ED Notes (Signed)
 DC paperwork given and verbally understood.

## 2024-08-13 NOTE — ED Provider Notes (Signed)
 [place] EMERGENCY DEPARTMENT AT [facility] Provider Note   CSN: [id number] Arrival date & time: 08/13/24  1219     Patient presents with: Abdominal Pain, Emesis, and Dizziness  HPI Sherri [name] is a 23 y.o. female with PCOS, GERD, morbid obesity, bipolar 1 presenting for abdominal pain nausea vomiting diarrhea.  This started acutely this morning.  She states the abdominal pain is all over her abdomen.  She states the symptoms are very similar to when she was here in May when she had a CT scan at that time which was reassuring.  She reports that she is no longer on Wegovy  and stopped it after her last encounter in May.  Denies fever denies bloody output, urinary symptoms or vaginal symptoms.    Abdominal Pain Associated symptoms: vomiting   Emesis Associated symptoms: abdominal pain   Dizziness Associated symptoms: vomiting        Prior to Admission medications   Medication Sig Start Date End Date Taking? Authorizing Provider  albuterol  (VENTOLIN  HFA) 108 (90 Base) MCG/ACT inhaler Inhale 2 puffs into the lungs every 4 (four) hours as needed for wheezing. 07/14/24   Janna Ferrier, DO  ARIPiprazole  (ABILIFY ) 5 MG tablet Take 1 tablet (5 mg total) by mouth daily. 06/23/24   Cleotilde Perkins, DO  budesonide -formoterol  (SYMBICORT ) 160-4.5 MCG/ACT inhaler Inhale 2 puffs into the lungs 2 (two) times daily. 06/23/24   Cleotilde Perkins, DO  clonazePAM  (KLONOPIN ) 0.5 MG tablet Take 1 tablet (0.5 mg total) by mouth during the day AND 2 tablets (1 mg total) at night for a total of 3 tablets daily. 05/15/24     dicyclomine  (BENTYL ) 10 MG capsule Take 1 capsule (10 mg total) by mouth 2 (two) times daily as needed for spasms. 11/13/23   Orlando Pond, DO  etonogestrel  (NEXPLANON ) 68 MG IMPL implant 1 each (68 mg total) by Subdermal route once.    Viviana Aleck DASEN, FNP  famotidine  (PEPCID ) 20 MG tablet Take 1 tablet (20 mg total) by mouth 2 (two) times daily as needed for heartburn or  indigestion. 04/20/24   Silver Wonda LABOR, PA  linaclotide  (LINZESS ) 72 MCG capsule Take 1 capsule (72 mcg total) by mouth daily before breakfast. Patient taking differently: Take 72 mcg by mouth as needed. 10/23/23 10/17/24  Craig Alan SAUNDERS, PA-C  metFORMIN  (GLUCOPHAGE ) 500 MG tablet Take 1 tablet (500 mg total) by mouth 2 (two) times daily with a meal. 04/17/24   Orlando Pond, DO  pantoprazole  (PROTONIX ) 40 MG tablet Take 1 tablet (40 mg total) by mouth daily. 06/23/24   Cleotilde Perkins, DO  promethazine  (PHENERGAN ) 25 MG suppository Place 1 suppository (25 mg total) rectally every 6 (six) hours as needed for nausea or vomiting. 07/09/24   Janna Ferrier, DO  rizatriptan  (MAXALT ) 10 MG tablet Take 1 tablet (10 mg total) by mouth daily. May repeat in 2 hours if needed 07/09/24   Gomes, Adriana, DO  triamcinolone  cream in Minerin Creme Apply 1 application topically 2 (two) times daily. Do not use for more than 7 consecutive days 11/13/23   Orlando Pond, DO    Allergies: Ibuprofen  and Shellfish allergy    Review of Systems  Gastrointestinal:  Positive for abdominal pain and vomiting.  Neurological:  Positive for dizziness.    Updated Vital Signs BP 127/82 (BP Location: Right Arm)   Pulse 81   Temp 98.8 F (37.1 C) (Oral)   Resp 18   SpO2 100%   Physical Exam Vitals and nursing  note reviewed.  HENT:     Head: Normocephalic and atraumatic.     Mouth/Throat:     Mouth: Mucous membranes are moist.  Eyes:     General:        Right eye: No discharge.        Left eye: No discharge.     Conjunctiva/sclera: Conjunctivae normal.  Cardiovascular:     Rate and Rhythm: Normal rate and regular rhythm.     Pulses: Normal pulses.     Heart sounds: Normal heart sounds.  Pulmonary:     Effort: Pulmonary effort is normal.     Breath sounds: Normal breath sounds.  Abdominal:     General: Abdomen is flat. There is no distension.     Palpations: Abdomen is soft.     Tenderness: There is no  abdominal tenderness.  Skin:    General: Skin is warm and dry.  Neurological:     General: No focal deficit present.  Psychiatric:        Mood and Affect: Mood normal.     (all labs ordered are listed, but only abnormal results are displayed) Labs Reviewed  CBC - Abnormal; Notable for the following components:      Result Value   MCV 78.9 (*)    MCH 25.1 (*)    RDW 16.1 (*)    All other components within normal limits  LIPASE, BLOOD  COMPREHENSIVE METABOLIC PANEL WITH GFR  URINALYSIS, ROUTINE W REFLEX MICROSCOPIC  PREGNANCY, URINE    EKG: None  Radiology: No results found.   Procedures   Medications Ordered in the ED  sodium chloride  0.9 % bolus 1,000 mL (0 mLs Intravenous Stopped 08/13/24 1511)  morphine  (PF) 4 MG/ML injection 4 mg (4 mg Intravenous Given 08/13/24 1406)  ondansetron  (ZOFRAN ) injection 4 mg (4 mg Intravenous Given 08/13/24 1406)                                    Medical Decision Making Amount and/or Complexity of Data Reviewed Labs: ordered.  Risk Prescription drug management.   Initial Impression and Ddx 23 year old well-appearing female presenting for abdominal pain, nausea vomiting and diarrhea that started this morning.  Exam was unremarkable.  DDx includes appendicitis, acute cholecystitis, pyelonephritis, kidney stone, UTI, other. Patient PMH that increases complexity of ED encounter:  PCOS, GERD, morbid obesity, bipolar 1   Interpretation of Diagnostics - I independent reviewed and interpreted the labs as followed: non acute   Patient Reassessment and Ultimate Disposition/Management On reassessment patient states she was feeling much better and expressed a desire to leave.  I felt this was appropriate given her vitals are normal, no acute distress and nontoxic and labs are without acute derangement with a benign abdominal exam.  Per chart review did have a reassuring and unremarkable CT scan in May.  Suspect this is likely viral. Sent  Zofran  to her pharmacy.  Advised her to follow-up with her PCP.  Discussed return precautions.  Discharged in good condition.  Patient management required discussion with the following services or consulting groups:  None  Complexity of Problems Addressed Acute complicated illness or Injury  Additional Data Reviewed and Analyzed Further history obtained from: Past medical history and medications listed in the EMR and Prior ED visit notes  Patient Encounter Risk Assessment Consideration of hospitalization      Final diagnoses:  Abdominal pain, unspecified abdominal location  ED Discharge Orders     None          Lang Norleen POUR, PA-C 08/13/24 1522    Ruthe Cornet, DO 08/14/24 [phone]

## 2024-08-13 NOTE — ED Notes (Signed)
Difficult stick  

## 2024-08-13 NOTE — ED Triage Notes (Signed)
 Patient reports waking up with nausea, vomiting, dizziness. Says this happens every few months. Was last seen here in May.

## 2024-08-13 NOTE — Discharge Instructions (Signed)
 Evaluation today for your abdominal pain nausea vomiting diarrhea was reassuring.  Your labs were unremarkable.  Recommend assertive hydration at home.  Also sent Zofran  to your pharmacy.  Please follow-up your PCP.  If you develop worsening abdominal pain, fever, cannot tolerate p.o. intake or any other concerning symptom please return to the ED for further evaluation.

## 2024-08-14 ENCOUNTER — Telehealth (HOSPITAL_COMMUNITY): Payer: Self-pay | Admitting: Psychiatry

## 2024-08-14 ENCOUNTER — Encounter (HOSPITAL_COMMUNITY): Payer: MEDICAID | Admitting: Psychiatry

## 2024-08-14 NOTE — Telephone Encounter (Signed)
 I called patient at 10:40am at [phone] regarding scheduled appointment at 10:30am. Patient picked up, stated that she could not make today's appointment due to going to hospital yesterday. She asked to reschedule. Provided patient with front desk # to reschedule appointment.   Patient had not been seen by this provider yet, was previously seen by Dr. Juleen.

## 2024-08-23 ENCOUNTER — Other Ambulatory Visit (HOSPITAL_BASED_OUTPATIENT_CLINIC_OR_DEPARTMENT_OTHER): Payer: Self-pay

## 2024-08-27 NOTE — Progress Notes (Deleted)
 BH MD Outpatient Progress Note  08/27/2024 2:26 PM Sherri [name]  MRN:  [id number]  Assessment:  Sherri [name] presents for follow-up evaluation. Today, 08/27/24, patient reports ***  The risks/benefits/side-effects/alternatives to this medication were discussed in detail with the patient and time was given for questions. The patient consents to medication trial.   Identifying Information: Sherri [name] is a 23 y.o. female with a history of GAD, PTSD, MDD who is an established patient with [facility] for management of medications.  Risk Assessment: An assessment of suicide and violence risk factors was performed as part of this evaluation and is not significantly changed from the last visit.             While future psychiatric events cannot be accurately predicted, the patient does not currently require acute inpatient psychiatric care and does not currently meet [place]  involuntary commitment criteria.          Plan:  # GAD # PTSD # Hx of MDD,severe w/ psychotic features (r/o avoidant traits)  - Continue Trazodone  25mg  w/ OTC melatonin for sleep PRN - Continue Hydroxyzine  25mg  TID PRN ?abilify   # Tic/ movement d/o of etiology possibly functional in nature -- continue f/u with [facility] neurology -- klonopin  0.5am, 1mg  at bedtime per neurology   Return to care in ***  Patient was given contact information for behavioral health clinic and was instructed to call 911 for emergencies.   Patient and plan of care will be discussed with the Attending MD ,Dr. ***, who agrees with the above statement and plan.   Subjective:  Chief Complaint: No chief complaint on file.   Interval History: *** Last seen by Dr. Juleen 03/2024 Interval notes / No shows: patient cancelled appointment 07/11/24 -03/2024: went to [facility] for weight mgmt, started on wegovy  -04/2024: went to ED for abdominal pain, suspected 2/2 viral gastroenteritis. Went to ED again for  abd pain and got CTAP which showed nothing acute. Switched her from protonix  to prilosec.   04/2024: saw [facility] neurologist. Increased klonopin  to 1mg  at bedtime. Discontinued abilify  2/2 no hallucinations.  -05/2024: saw cardiology for precordial chest pain, no further w/u. EKG ordered but not obtained -06/2024: saw PCP, suspected chest pain due to asthma. Migraines trial ubrelvy . Restarted on abilify  5mg  due to hearing voices, not command. Also having some passive SI. Saw Pcp again 07/09/2024 and trial of rizatriptan . Also went to PCP for pap smear. Using nexplanon  for contraception. Last sexually active 1 year ago.  07/2024: went to see plastic surgeon who recommended wt loss to BMI <40 prior to elective breast reduction surgery.  Labs: 04/2024 CBC, CMP stable. 12/2023 A1c prediabetes. 02/2023 lipid panel wnl.  PDMP: klonopin  0.5mg  90# 30 days last filled 06/17/24, rx by [facility] neurologist EKG: 05/2023 qtc 413 MRI brain / EEG: 12/2022 MRI brain wnl. Sleep study: home sleep study ordered but not completed.  Tics: neck movements, body (hit self), scream phrases and sounds, holding breath -hx of manic episodes inconclusive. Starting [employer] with focus on arts.    [ ]  sleep [ ]  appetite  [ ]  medication side effects  [ ]  mood  [ ]  stressors  [ ]  substance use  [ ]  safety    Visit Diagnosis: No diagnosis found.  Past Psychiatric History:  Diagnoses: Bipolar disorder  Medication trials: risperdal , abilify , trazodone , zoloft , wellbutrin , prozac , amitryptyline, cymbalta  Previous psychiatrist/therapist: Dr. Juleen  Hospitalizations: *** Suicide attempts: *** SIB: *** Hx of violence towards others: ***  Current access to guns: *** Hx of trauma/abuse: *** Developmental history: patient was born full-term, developmentally normal,   Initial note: per Niki Bach Patient endorses symptoms of anxiety, depression, and hypomania. Patient informed writer that Abilify  was ineffective in managing her  mood, psychosis, or Tourette's.  Today she is agreeable to starting Lybalvi  10 mg daily to help manage mood.  Patient given a 2-week sample of the medication. 1 Bipolar disorder, current episode depressed, severe, with psychotic features (HCC) START- OLANZapine -Samidorphan (LYBALVI ) 10-10 MG TABS; Take 10 mg by mouth at bedtime.  Dispense: 30 tablet; Refill: 3 3. Generalized anxiety disorder START- escitalopram  (LEXAPRO ) 10 MG tablet; Take 1 tablet (10 mg total) by mouth daily.  Dispense: 30 tablet; Refill: 3  Substance Use History:  EtOH:   Nicotine:   THC/CBD:  IV drug use:  Stimulants:  Opiates:  Sedative/hypnotics:  Hallucinogens:  Seizures:   Past Medical History:  Past Medical History:  Diagnosis Date    Chronic pain 02/02/2020   Acid reflux    Allergy    Seasonal allergies   Anxiety    Phreesia 11/24/2020   Asthma    Carpal tunnel syndrome, bilateral 11/21/2018   S/p bilat surgery, release   Eczema    Gallstones    GERD (gastroesophageal reflux disease)    Phreesia 11/24/2020   Obesity    PCOS (polycystic ovarian syndrome) 02/26/2020    Past Surgical History:  Procedure Laterality Date   BILATERAL CARPAL TUNNEL RELEASE     CHOLECYSTECTOMY N/A 08/08/2017   Procedure: LAPAROSCOPIC CHOLECYSTECTOMY;  Surgeon: Chuckie Casimiro KIDD, MD;  Location: [facility] OR;  Service: General;  Laterality: N/A;   UPPER GASTROINTESTINAL ENDOSCOPY     LMP: Contraception:  Family Psychiatric History:  Mother depression, brother depression, Sister depression, 2 brother learning disability, a maternal aunt depression   Family History:  Family History  Problem Relation Age of Onset   Asthma Mother    Arthritis Mother    Depression Mother    Diabetes Mother    Hypertension Mother    Obesity Mother    Irritable bowel syndrome Mother    Obesity Father    Heart attack Father 55       MI   Asthma Sister    Learning disabilities Sister    Stroke Sister    Asthma Brother    Depression  Brother    Learning disabilities Brother    Irritable bowel syndrome Brother    Arthritis Maternal Grandmother    Diabetes Maternal Grandmother    Heart disease Maternal Grandmother    Kidney disease Maternal Grandmother    Hypertension Maternal Grandmother    Hyperlipidemia Maternal Grandmother    Cervical cancer Maternal Grandmother    Stroke Maternal Grandfather    Heart disease Paternal Grandmother    Hypertension Paternal Grandmother    Depression Maternal Aunt    Diabetes Maternal Aunt    Mental illness Maternal Aunt    Diabetes Maternal Uncle    Kidney disease Maternal Uncle    Cancer Other    COPD Other    Heart disease Other    Hypertension Other    Colon cancer Neg Hx    Esophageal cancer Neg Hx    Pancreatic cancer Neg Hx    Stomach cancer Neg Hx     Social History:  Academic/Vocational: *** Housing:  Income: Family: Support:  Children:  Marital Status  Substance Use History:   Social History   Socioeconomic History   Marital status: Single  Spouse name: Not on file   Number of children: 0   Years of education: 51   Highest education level: Some college, no degree  Occupational History   Occupation: Furniture conservator/restorer to college   Occupation: Seeking disability  Tobacco Use   Smoking status: Never    Passive exposure: Yes   Smokeless tobacco: Never   Tobacco comments:    mom stopped smoking  Vaping Use   Vaping status: Never Used  Substance and Sexual Activity   Alcohol use: No   Drug use: No   Sexual activity: Never  Other Topics Concern   Not on file  Social History Narrative   Lives with Mom and some siblings, some of whom have children of their own, dropped out of [employer]. plans to start [employer] for GED.   Right-handed.   No daily use of caffeine.    Social Drivers of Health   Financial Resource Strain: Medium Risk (04/15/2024)   Received from [facility]   Overall Financial Resource Strain (CARDIA)    Difficulty of Paying Living Expenses:  Somewhat hard  Food Insecurity: Food Insecurity Present (04/15/2024)   Received from [facility]   Hunger Vital Sign    Within the past 12 months, you worried that your food would run out before you got the money to buy more.: Sometimes true    Within the past 12 months, the food you bought just didn't last and you didn't have money to get more.: Sometimes true  Transportation Needs: Unmet Transportation Needs (04/15/2024)   Received from [facility] - Transportation    Lack of Transportation (Medical): Yes    Lack of Transportation (Non-Medical): No  Physical Activity: Insufficiently Active (04/15/2024)   Received from [facility]   Exercise Vital Sign    On average, how many days per week do you engage in moderate to strenuous exercise (like a brisk walk)?: 1 day    On average, how many minutes do you engage in exercise at this level?: 20 min  Stress: Stress Concern Present (04/15/2024)   Received from [facility] - Occupational Stress Questionnaire    Feeling of Stress : To some extent  Social Connections: Somewhat Isolated (04/15/2024)   Received from [facility]   Social Network    How would you rate your social network (family, work, friends)?: Restricted participation with some degree of social isolation    Allergies:  Allergies  Allergen Reactions   Ibuprofen  Other (See Comments)    Chest pain   Shellfish Allergy Hives and Itching    Mouth itches, facial hives.     Current Medications: Current Outpatient Medications  Medication Sig Dispense Refill   albuterol  (VENTOLIN  HFA) 108 (90 Base) MCG/ACT inhaler Inhale 2 puffs into the lungs every 4 (four) hours as needed for wheezing. 18 g 3   ARIPiprazole  (ABILIFY ) 5 MG tablet Take 1 tablet (5 mg total) by mouth daily. 30 tablet 0   budesonide -formoterol  (SYMBICORT ) 160-4.5 MCG/ACT inhaler Inhale 2 puffs into the lungs 2 (two) times daily. 10.2 g 3   clonazePAM   (KLONOPIN ) 0.5 MG tablet Take 1 tablet (0.5 mg total) by mouth during the day AND 2 tablets (1 mg total) at night for a total of 3 tablets daily. 90 tablet 3   dicyclomine  (BENTYL ) 10 MG capsule Take 1 capsule (10 mg total) by mouth 2 (two) times daily as needed for spasms. 60 capsule 0   etonogestrel  (NEXPLANON ) 68  MG IMPL implant 1 each (68 mg total) by Subdermal route once. 1 each 0   famotidine  (PEPCID ) 20 MG tablet Take 1 tablet (20 mg total) by mouth 2 (two) times daily as needed for heartburn or indigestion. 30 tablet 0   linaclotide  (LINZESS ) 72 MCG capsule Take 1 capsule (72 mcg total) by mouth daily before breakfast. (Patient taking differently: Take 72 mcg by mouth as needed.) 90 capsule 3   metFORMIN  (GLUCOPHAGE ) 500 MG tablet Take 1 tablet (500 mg total) by mouth 2 (two) times daily with a meal. 180 tablet 3   ondansetron  (ZOFRAN ) 4 MG tablet Take 1 tablet (4 mg total) by mouth every 6 (six) hours. 12 tablet 0   pantoprazole  (PROTONIX ) 40 MG tablet Take 1 tablet (40 mg total) by mouth daily. 30 tablet 3   promethazine  (PHENERGAN ) 25 MG suppository Place 1 suppository (25 mg total) rectally every 6 (six) hours as needed for nausea or vomiting. 12 each 0   rizatriptan  (MAXALT ) 10 MG tablet Take 1 tablet (10 mg total) by mouth daily. May repeat in 2 hours if needed 90 tablet 0   triamcinolone  cream in [place] Apply 1 application topically 2 (two) times daily. Do not use for more than 7 consecutive days 454 g 1   No current facility-administered medications for this visit.    ROS: Review of Systems  Objective:  Psychiatric Specialty Exam: There were no vitals taken for this visit.There is no height or weight on file to calculate BMI.  General Appearance: {Appearance:22683}  Eye Contact:  {BHH EYE CONTACT:22684}  Speech:  {Speech:22685}  Volume:  {Volume (PAA):22686}  Mood:  {BHH MOOD:22306}  Affect:  {Affect (PAA):22687}  Thought Content: {Thought Content:22690}   Suicidal  Thoughts:  {ST/HT (PAA):22692}  Homicidal Thoughts:  {ST/HT (PAA):22692}  Thought Process:  {Thought Process (PAA):22688}  Orientation:  {BHH ORIENTATION (PAA):22689}    Memory: Grossly intact ***  Judgment:  {Judgement (PAA):22694}  Insight:  {Insight (PAA):22695}  Concentration:  {Concentration:21399}  Recall: not formally assessed ***  Fund of Knowledge: {BHH GOOD/FAIR/POOR:22877}  Language: {BHH GOOD/FAIR/POOR:22877}  Psychomotor Activity:  {Psychomotor (PAA):22696}  Akathisia:  {BHH YES OR NO:22294}  AIMS (if indicated): {Desc; done/not:10129}  Assets:  {Assets (PAA):22698}  ADL's:  {BHH JIO'D:77709}  Cognition: {chl bhh cognition:304700322}  Sleep:  {BHH GOOD/FAIR/POOR:22877}   PE: General: well-appearing; no acute distress *** Pulm: no increased work of breathing on room air *** Strength & Muscle Tone: {desc; muscle tone:32375} Neuro: no focal neurological deficits observed *** Gait & Station: {PE GAIT ED WJUO:77474}  Metabolic Disorder Labs: Lab Results  Component Value Date   HGBA1C 5.8 (A) 12/21/2023   MPG 123 02/11/2020   MPG 126 07/18/2019   Lab Results  Component Value Date   PROLACTIN 7.0 05/25/2023   PROLACTIN 12.2 07/18/2019   Lab Results  Component Value Date   CHOL 176 03/01/2023   TRIG 50 03/01/2023   HDL 73 03/01/2023   CHOLHDL 2.4 03/01/2023   VLDL 16 07/25/2013   LDLCALC 93 03/01/2023   LDLCALC 79 01/27/2021   Lab Results  Component Value Date   TSH 0.450 05/25/2023   TSH 0.820 01/11/2023    Therapeutic Level Labs: No results found for: LITHIUM No results found for: VALPROATE No results found for: CBMZ  Screenings:  AIMS    Flowsheet Row Clinical Support from 07/27/2023 in [facility]  AIMS Total Score 4   GAD-7    Flowsheet Row Video Visit from 09/06/2022  in [facility] Clinical Support from 01/30/2022 in [facility] Office Visit from  11/03/2021 in [facility] Office Visit from [facility]/08/[facility] in [facility] Video Visit from 02/10/2020 in [facility]  Total GAD-7 Score 13 19 18 12 21    PHQ2-9    Flowsheet Row Office Visit from 07/15/2024 in [facility] - [facility] Dept Of [place]. [facility] Office Visit from 07/09/2024 in [facility] - A Dept Of [place]. [facility] Office Visit from 06/23/2024 in [facility] - A Dept Of [place]. [facility] Office Visit from 03/24/2024 in [facility] - A Dept Of [place]. [facility] Office Visit from 05/25/2023 in [facility] - A Dept Of [place]. [facility]  PHQ-2 Total Score 1 1 4  0 5  PHQ-9 Total Score 8 9 18 3 19    Flowsheet Row ED from 08/13/2024 in [facility] Emergency Department at [facility] ED from 05/06/2024 in [facility] Emergency Department at [facility] ED from 04/20/2024 in [facility] Emergency Department at [facility]  C-SSRS RISK CATEGORY No Risk No Risk No Risk    Collaboration of Care: Collaboration of Care: [facility] OP Collaboration of Rjmz:[id number]}  Patient/Guardian was advised Release of Information must be obtained prior to any record release in order to collaborate their care with an outside provider. Patient/Guardian was advised if they have not already done so to contact the registration department to sign all necessary forms in order for us  to release information regarding their care.   Consent: Patient/Guardian gives verbal consent for treatment and assignment of benefits for services provided during this visit. Patient/Guardian expressed understanding and agreed to proceed.   Corean Minor, MD, PGY-3 08/27/2024, 2:26 PM

## 2024-09-02 ENCOUNTER — Telehealth (HOSPITAL_COMMUNITY): Payer: Self-pay | Admitting: Psychiatry

## 2024-09-02 ENCOUNTER — Encounter (HOSPITAL_COMMUNITY): Payer: MEDICAID | Admitting: Psychiatry

## 2024-09-02 NOTE — Telephone Encounter (Signed)
 Noted that patient was marked as no show by front desk today, appointment rescheduled to 09/30/24.

## 2024-09-12 ENCOUNTER — Other Ambulatory Visit: Payer: Self-pay

## 2024-09-12 ENCOUNTER — Other Ambulatory Visit (HOSPITAL_COMMUNITY): Payer: Self-pay

## 2024-09-12 ENCOUNTER — Other Ambulatory Visit: Payer: Self-pay | Admitting: Student

## 2024-09-12 MED ORDER — ARIPIPRAZOLE 5 MG PO TABS
5.0000 mg | ORAL_TABLET | Freq: Every day | ORAL | 0 refills | Status: AC
  Filled 2024-09-12: qty 30, 30d supply, fill #0

## 2024-09-13 ENCOUNTER — Other Ambulatory Visit (HOSPITAL_COMMUNITY): Payer: Self-pay

## 2024-09-15 ENCOUNTER — Other Ambulatory Visit: Payer: Self-pay

## 2024-09-18 ENCOUNTER — Other Ambulatory Visit: Payer: Self-pay | Admitting: Student

## 2024-09-18 ENCOUNTER — Other Ambulatory Visit (HOSPITAL_COMMUNITY): Payer: Self-pay

## 2024-09-18 MED ORDER — FAMOTIDINE 20 MG PO TABS
20.0000 mg | ORAL_TABLET | Freq: Two times a day (BID) | ORAL | 0 refills | Status: DC | PRN
  Filled 2024-09-18: qty 30, 15d supply, fill #0

## 2024-09-19 ENCOUNTER — Other Ambulatory Visit: Payer: Self-pay

## 2024-09-23 NOTE — Progress Notes (Deleted)
**Note De-Identified Sherri Obfuscation**  BH MD Outpatient Progress Note  09/23/2024 2:15 PM Sherri Sherri [name]  MRN:  [id number]  Assessment:  Sherri Sherri [name] presents for follow-up evaluation. Today, 09/23/24, patient reports ***  Sherri risks/benefits/side-effects/alternatives to this medication were discussed in detail with Sherri patient and time was given for questions. Sherri patient consents to medication trial.   Identifying Information: Sherri Sherri [name] is a 23 y.o. female with a Sherri [name] of GAD, PTSD, MDD who is an established patient with [name] for management of medications.  Risk Assessment: An assessment of suicide and violence risk factors was performed as part of this evaluation and is not significantly changed from Sherri last visit.             While future psychiatric events cannot be accurately predicted, Sherri patient does not currently require acute inpatient psychiatric care and does not currently meet Sherri Sherri [name]  involuntary commitment criteria.          Plan:  # GAD # PTSD # Hx of MDD,severe w/ psychotic features (r/o avoidant traits)  - Continue Trazodone  25mg  w/ OTC melatonin for sleep PRN - Continue Hydroxyzine  25mg  TID PRN ?abilify   # Tic/ movement d/o of etiology possibly functional in nature -- continue f/u with Sherri Sherri [name] neurology -- klonopin  0.5am, 1mg  at bedtime per neurology   Return to care in ***  Patient was given contact information for behavioral health clinic and was instructed to call 911 for emergencies.   Patient and plan of care will be discussed with Sherri Attending MD ,Dr. ***, who agrees with Sherri above statement and plan.   Subjective:  Chief Complaint: No chief complaint on file.   Interval Sherri [name]: *** Last seen by Dr. Juleen 03/2024 Interval notes / No shows: patient cancelled appointment 07/11/24. No show to appointment 8/28 and 9/16.  -03/2024: went to [facility] for weight mgmt, started on wegovy  -04/2024: went to ED for abdominal pain, suspected 2/2 viral  gastroenteritis. Went to ED again for abd pain and got CTAP which showed nothing acute. Switched her from protonix  to prilosec.   04/2024: saw [place] [name] neurologist. Increased klonopin  to 1mg  at bedtime. Discontinued abilify  2/2 no hallucinations.  -05/2024: saw cardiology for precordial chest pain, no further w/u. EKG ordered but not obtained -06/2024: saw PCP, suspected chest pain due to asthma. Migraines trial ubrelvy . Restarted on abilify  5mg  due to hearing voices, not command. Also having some passive SI. Saw Pcp again 07/09/2024 and trial of rizatriptan . Also went to PCP for pap smear. Using nexplanon  for contraception. Last sexually active 1 year ago.  07/2024: went to see plastic surgeon who recommended wt loss to BMI <40 prior to elective breast reduction surgery.  Labs: 04/2024 CBC, CMP stable. 12/2023 A1c prediabetes. 02/2023 lipid panel wnl.  PDMP: klonopin  0.5mg  90# 30 days last filled 06/17/24, rx by Sherri Sherri [name] neurologist EKG: 05/2023 qtc 413 MRI brain / EEG: 12/2022 MRI brain wnl. Sleep study: home sleep study ordered but not completed.  Tics: neck movements, body (hit self), scream phrases and sounds, holding breath -hx of manic episodes inconclusive. Starting [employer] with focus on arts.    [ ]  sleep [ ]  appetite  [ ]  medication side effects  [ ]  mood  [ ]  stressors  [ ]  substance use  [ ]  safety    Visit Diagnosis: No diagnosis found.  Past Psychiatric Sherri [name]:  Diagnoses: Bipolar disorder  Medication trials: risperdal , abilify , trazodone , zoloft , wellbutrin , prozac , amitryptyline, cymbalta  Previous psychiatrist/therapist: Dr. Juleen  Hospitalizations: *** Suicide attempts: ***  SIB: *** Hx of violence towards others: *** Current access to guns: *** Hx of trauma/abuse: *** Developmental Sherri [name]: patient was born full-term, developmentally normal,   Initial note: per Sherri Sherri [name] Patient endorses symptoms of anxiety, depression, and hypomania. Patient informed writer that  Abilify  was ineffective in managing her mood, psychosis, or Tourette's.  Today she is agreeable to starting Lybalvi  10 mg daily to help manage mood.  Patient given a 2-week sample of Sherri medication. 1 Bipolar disorder, current episode depressed, severe, with psychotic features (HCC) START- OLANZapine -Samidorphan (LYBALVI ) 10-10 MG TABS; Take 10 mg by mouth at bedtime.  Dispense: 30 tablet; Refill: 3 3. Generalized anxiety disorder START- escitalopram  (LEXAPRO ) 10 MG tablet; Take 1 tablet (10 mg total) by mouth daily.  Dispense: 30 tablet; Refill: 3  Substance Use Sherri [name]:  EtOH:   Nicotine:   THC/CBD:  IV drug use:  Stimulants:  Opiates:  Sedative/hypnotics:  Hallucinogens:  Seizures:   Past Medical Sherri [name]:  Past Medical Sherri [name]:  Diagnosis Date    Chronic pain 02/02/2020   Acid reflux    Allergy    Seasonal allergies   Anxiety    Phreesia 11/24/2020   Asthma    Carpal tunnel syndrome, bilateral 11/21/2018   S/p bilat surgery, release   Eczema    Gallstones    GERD (gastroesophageal reflux disease)    Phreesia 11/24/2020   Obesity    PCOS (polycystic ovarian syndrome) 02/26/2020    Past Surgical Sherri [name]:  Procedure Laterality Date   BILATERAL CARPAL TUNNEL RELEASE     CHOLECYSTECTOMY N/A 08/08/2017   Procedure: LAPAROSCOPIC CHOLECYSTECTOMY;  Surgeon: Chuckie Casimiro KIDD, MD;  Location: [facility] OR;  Service: General;  Laterality: N/A;   UPPER GASTROINTESTINAL ENDOSCOPY     LMP: Contraception:  Family Psychiatric Sherri [name]:  Mother depression, brother depression, Sister depression, 2 brother learning disability, a maternal aunt depression   Family Sherri [name]:  Family Sherri [name]  Problem Relation Age of Onset   Asthma Mother    Arthritis Mother    Depression Mother    Diabetes Mother    Hypertension Mother    Obesity Mother    Irritable bowel syndrome Mother    Obesity Father    Heart attack Father 34       MI   Asthma Sister    Learning disabilities Sister    Stroke  Sister    Asthma Brother    Depression Brother    Learning disabilities Brother    Irritable bowel syndrome Brother    Arthritis Maternal Grandmother    Diabetes Maternal Grandmother    Heart disease Maternal Grandmother    Kidney disease Maternal Grandmother    Hypertension Maternal Grandmother    Hyperlipidemia Maternal Grandmother    Cervical cancer Maternal Grandmother    Stroke Maternal Grandfather    Heart disease Paternal Grandmother    Hypertension Paternal Grandmother    Depression Maternal Aunt    Diabetes Maternal Aunt    Mental illness Maternal Aunt    Diabetes Maternal Uncle    Kidney disease Maternal Uncle    Cancer Other    COPD Other    Heart disease Other    Hypertension Other    Colon cancer Neg Hx    Esophageal cancer Neg Hx    Pancreatic cancer Neg Hx    Stomach cancer Neg Hx     Social Sherri [name]:  Academic/Vocational: *** Housing:  Income: Family: Support:  Children:  Marital Status  Substance Use Sherri [name]:   Social Sherri [name]  Socioeconomic Sherri [name]   Marital status: Single    Spouse name: Not on file   Number of children: 0   Years of education: 9   Highest education level: Some college, no degree  Occupational Sherri [name]   Occupation: Furniture conservator/restorer to college   Occupation: Seeking disability  Tobacco Use   Smoking status: Never    Passive exposure: Yes   Smokeless tobacco: Never   Tobacco comments:    mom stopped smoking  Vaping Use   Vaping status: Never Used  Substance and Sexual Activity   Alcohol use: No   Drug use: No   Sexual activity: Never  Other Topics Concern   Not on file  Social Sherri [name] Narrative   Lives with Mom and some siblings, some of whom have children of their own, dropped out of [employer]. plans to start [employer] for GED.   Right-handed.   No daily use of caffeine.    Social Drivers of Health   Financial Resource Strain: Medium Risk (04/15/2024)   Received from [name]   Overall Financial Resource Strain (CARDIA)     Difficulty of Paying Living Expenses: Somewhat hard  Food Insecurity: Food Insecurity Present (04/15/2024)   Received from [name]   Hunger Vital Sign    Within Sherri past 12 months, you worried that your food would run out before you got Sherri money to buy more.: Sometimes true    Within Sherri past 12 months, Sherri food you bought just didn't last and you didn't have money to get more.: Sometimes true  Transportation Needs: Unmet Transportation Needs (04/15/2024)   Received from [name] - Transportation    Lack of Transportation (Medical): Yes    Lack of Transportation (Non-Medical): No  Physical Activity: Insufficiently Active (04/15/2024)   Received from [name]   Exercise Vital Sign    On average, how many days per week do you engage in moderate to strenuous exercise (like a brisk walk)?: 1 day    On average, how many minutes do you engage in exercise at this level?: 20 min  Stress: Stress Concern Present (04/15/2024)   Received from [name] - Occupational Stress Questionnaire    Feeling of Stress : To some extent  Social Connections: Somewhat Isolated (04/15/2024)   Received from [name]   Social Network    How would you rate your social network (family, work, friends)?: Restricted participation with some degree of social isolation    Allergies:  Allergies  Allergen Reactions   Ibuprofen  Other (See Comments)    Chest pain   Shellfish Allergy Hives and Itching    Mouth itches, facial hives.     Current Medications: Current Outpatient Medications  Medication Sig Dispense Refill   albuterol  (VENTOLIN  HFA) 108 (90 Base) MCG/ACT inhaler Inhale 2 puffs into Sherri lungs every 4 (four) hours as needed for wheezing. 18 g 3   ARIPiprazole  (ABILIFY ) 5 MG tablet Take 1 tablet (5 mg total) by mouth daily. 30 tablet 0   budesonide -formoterol  (SYMBICORT ) 160-4.5 MCG/ACT inhaler Inhale 2 puffs into Sherri lungs 2 (two)  times daily. 10.2 g 3   clonazePAM  (KLONOPIN ) 0.5 MG tablet Take 1 tablet (0.5 mg total) by mouth during Sherri day AND 2 tablets (1 mg total) at night for a total of 3 tablets daily. 90 tablet 3   dicyclomine  (BENTYL ) 10 MG capsule Take 1 capsule (10 mg total) by mouth 2 (two) times daily as needed  for spasms. 60 capsule 0   etonogestrel  (NEXPLANON ) 68 MG IMPL implant 1 each (68 mg total) by Subdermal route once. 1 each 0   famotidine  (PEPCID ) 20 MG tablet Take 1 tablet (20 mg total) by mouth 2 (two) times daily as needed for heartburn or indigestion. 30 tablet 0   linaclotide  (LINZESS ) 72 MCG capsule Take 1 capsule (72 mcg total) by mouth daily before breakfast. (Patient taking differently: Take 72 mcg by mouth as needed.) 90 capsule 3   metFORMIN  (GLUCOPHAGE ) 500 MG tablet Take 1 tablet (500 mg total) by mouth 2 (two) times daily with a meal. 180 tablet 3   ondansetron  (ZOFRAN ) 4 MG tablet Take 1 tablet (4 mg total) by mouth every 6 (six) hours. 12 tablet 0   pantoprazole  (PROTONIX ) 40 MG tablet Take 1 tablet (40 mg total) by mouth daily. 30 tablet 3   promethazine  (PHENERGAN ) 25 MG suppository Place 1 suppository (25 mg total) rectally every 6 (six) hours as needed for nausea or vomiting. 12 each 0   rizatriptan  (MAXALT ) 10 MG tablet Take 1 tablet (10 mg total) by mouth daily. May repeat in 2 hours if needed 90 tablet 0   triamcinolone  cream in Minerin Creme Apply 1 application topically 2 (two) times daily. Do not use for more than 7 consecutive days 454 g 1   No current facility-administered medications for this visit.    ROS: Review of Systems  Objective:  Psychiatric Specialty Exam: There were no vitals taken for this visit.There is no height or weight on file to calculate BMI.  General Appearance: {Appearance:22683}  Eye Contact:  {BHH EYE CONTACT:22684}  Speech:  {Speech:22685}  Volume:  {Volume (PAA):22686}  Mood:  {BHH MOOD:22306}  Affect:  {Affect (PAA):22687}  Thought Content:  {Thought Content:22690}   Suicidal Thoughts:  {ST/HT (PAA):22692}  Homicidal Thoughts:  {ST/HT (PAA):22692}  Thought Process:  {Thought Process (PAA):22688}  Orientation:  {BHH ORIENTATION (PAA):22689}    Memory: Grossly intact ***  Judgment:  {Judgement (PAA):22694}  Insight:  {Insight (PAA):22695}  Concentration:  {Concentration:21399}  Recall: not formally assessed ***  Fund of Knowledge: {BHH GOOD/FAIR/POOR:22877}  Language: {BHH GOOD/FAIR/POOR:22877}  Psychomotor Activity:  {Psychomotor (PAA):22696}  Akathisia:  {BHH YES OR NO:22294}  AIMS (if indicated): {Desc; done/not:10129}  Assets:  {Assets (PAA):22698}  ADL's:  {BHH JIO'D:77709}  Cognition: {chl bhh cognition:304700322}  Sleep:  {BHH GOOD/FAIR/POOR:22877}   PE: General: well-appearing; no acute distress *** Pulm: no increased work of breathing on room air *** Strength & Muscle Tone: {desc; muscle tone:32375} Neuro: no focal neurological deficits observed *** Gait & Station: {PE GAIT ED WJUO:77474}  Metabolic Disorder Labs: Lab Results  Component Value Date   HGBA1C 5.8 (A) 12/21/2023   MPG 123 02/11/2020   MPG 126 07/18/2019   Lab Results  Component Value Date   PROLACTIN 7.0 05/25/2023   PROLACTIN 12.2 07/18/2019   Lab Results  Component Value Date   CHOL 176 03/01/2023   TRIG 50 03/01/2023   HDL 73 03/01/2023   CHOLHDL 2.4 03/01/2023   VLDL 16 07/25/2013   LDLCALC 93 03/01/2023   LDLCALC 79 01/27/2021   Lab Results  Component Value Date   TSH 0.450 05/25/2023   TSH 0.820 01/11/2023    Therapeutic Level Labs: No results found for: LITHIUM No results found for: VALPROATE No results found for: CBMZ  Screenings:  AIMS    Flowsheet Row Clinical Support from 07/27/2023 in [facility]  AIMS Total Score 4  GAD-7    Flowsheet Row Video Visit from 09/06/2022 in [name] Clinical Support from 01/30/2022 in [name] Office Visit from 11/03/2021 in [name] Office Visit from 06/08/2020 in [name] Video Visit from 02/10/2020 in [facility]  Total GAD-7 Score 13 19 18 12 21    PHQ2-9    Flowsheet Row Office Visit from 07/15/2024 in [facility] [place]. [facility] Office Visit from 07/09/2024 in [facility] - [facility] Of [place] Office Visit from 06/23/2024 in [name] Dept Of [place]. [facility] Office Visit from 03/24/2024 in [name] - [facility] Dept Of [place]. [name] Office Visit from 05/25/2023 in [name] - A Dept Of [place]. [name]  PHQ-2 Total Score 1 1 4  0 5  PHQ-9 Total Score 8 9 18 3 19    Flowsheet Row ED from 08/13/2024 in [facility] at [name] ED from 05/06/2024 in [name] at [name] ED from 04/20/2024 in [name] at [name]  C-SSRS RISK CATEGORY No Risk No Risk No Risk    Collaboration of Care: Collaboration of Care: [facility] OP Collaboration of Rjmz:[id number]}  Patient/Guardian was advised Release of Information must be obtained prior to any record release in order to collaborate their care with an outside provider. Patient/Guardian was advised if they have not already done so to contact Sherri registration department to sign all necessary forms in order for us  to release information regarding their care.   Consent: Patient/Guardian gives verbal consent for treatment and assignment of benefits for services provided during this visit. Patient/Guardian expressed understanding and agreed to proceed.   Corean Minor, MD, PGY-3 09/23/2024, 2:15 PM

## 2024-09-26 ENCOUNTER — Ambulatory Visit: Payer: MEDICAID | Admitting: Student

## 2024-09-30 ENCOUNTER — Encounter (HOSPITAL_COMMUNITY): Payer: MEDICAID | Admitting: Psychiatry

## 2024-10-01 ENCOUNTER — Other Ambulatory Visit (HOSPITAL_COMMUNITY): Payer: Self-pay

## 2024-10-01 ENCOUNTER — Ambulatory Visit (INDEPENDENT_AMBULATORY_CARE_PROVIDER_SITE_OTHER): Payer: MEDICAID | Admitting: Family Medicine

## 2024-10-01 VITALS — BP 131/87 | HR 97 | Ht 61.0 in | Wt 328.2 lb

## 2024-10-01 DIAGNOSIS — L219 Seborrheic dermatitis, unspecified: Secondary | ICD-10-CM

## 2024-10-01 DIAGNOSIS — Z23 Encounter for immunization: Secondary | ICD-10-CM | POA: Diagnosis not present

## 2024-10-01 DIAGNOSIS — L853 Xerosis cutis: Secondary | ICD-10-CM

## 2024-10-01 MED ORDER — SELSUN BLUE 1 % EX LOTN
1.0000 | TOPICAL_LOTION | Freq: Every day | CUTANEOUS | 0 refills | Status: AC
  Filled 2024-10-01: qty 118, 118d supply, fill #0

## 2024-10-01 NOTE — Progress Notes (Signed)
    SUBJECTIVE:   CHIEF COMPLAINT / HPI: dry skin  Discussed the use of AI scribe software for clinical note transcription with the patient, who gave verbal consent to proceed.  History of Present Illness Sherri [name] is a 23 year old female with eczema who presents with worsening facial and foot skin symptoms.  Facial dermatitis - Worsening pruritic and flaky skin for 3-4 months, primarily around the nose and behind the ears - Flaking causes debris to accumulate on her glasses - Severity greater than previous eczema flares - No relief with facial cleansers including Cerave, Aquaphor, and Cetaphil - Eczema cream previously effective for neck lesions is ineffective for current facial symptoms - Applies Aquaphor before lotion, primarily to the face, without improvement  Plantar xerosis - Persistent dry skin on the soles of the feet and heels - Discomfort when rubbing feet together - No improvement with regular use of Cetaphil and Aquaphor lotions    PERTINENT  PMH / PSH: Allergic rhinitis, GERD, Asthma, Bipolar  OBJECTIVE:   BP 131/87   Pulse 97   Ht 5' 1 (1.549 m)   Wt (!) 328 lb 3.2 oz (148.9 kg)   SpO2 99%   BMI 62.01 kg/m   Physical Exam General: NAD, well appearing Neuro: A&O Respiratory: normal WOB on RA Extremities: Moving all 4 extremities equally SKIN: Dry scaly rash surrounding bilateral eyebrows, behind left ear, mild xerosis present bilateral heels     ASSESSMENT/PLAN:   Assessment & Plan Seborrheic dermatitis Rash most consistent with seborrheic dermatitis. - Selsun Blue once daily for 1 week, every other day for 1 week, 3 times a week for 1 week, then stop - If not improving follow-up in our skin clinic - Can escalate to 2% ketoconazole shampoo if not improving as well Xerosis of skin Will trial emollient use on bilateral heels daily.  Patient agreeable to plan.  Patient plans to use Aquaphor, counseled that Carmol may help more secondary to  urea component increasing removal of dry skin. Encounter for immunization Flu and COVID vaccines administered today.   Return if symptoms worsen or fail to improve.  Ozell Provencal, MD, PGY-3 [facility] 11:49 AM 10/01/2024  [facility]

## 2024-10-01 NOTE — Patient Instructions (Addendum)
 It was great to see you! Thank you for allowing me to participate in your care!  Our plans for today:   VISIT SUMMARY: You visited us  today due to worsening skin symptoms on your face and feet. We have identified the issues and provided a treatment plan to help manage your symptoms.  YOUR PLAN: SEBORRHEIC DERMATITIS OF FACE AND PERIAURICULAR AREA: You have seborrheic dermatitis, which is causing flakiness and irritation on your face and around your ears. This condition has not improved with your current moisturizers or eczema creams. - I have sent the shampoo Selsun Blue to your pharmacy however you can also get this over-the-counter.  Please use this once daily to wash your hair and scalp for 1 week.  Then use 3 times a week and then once a week. - If this has not improved your symptoms please call to schedule an appointment in our skin clinic.  XEROSIS CUTIS OF SOLES AND HEELS: You have very dry skin on the soles of your feet and heels, which has not improved with your current lotions. -Continue using emollients like Aquaphor on your feet to help manage the dryness. - You may also try the emollient Carmol  - You also received the flu and COVID vaccines today.    Please arrive 15 minutes PRIOR to your next scheduled appointment time! If you do not, this affects OTHER patients' care.  Take care and seek immediate care sooner if you develop any concerns.   Ozell Provencal, MD, PGY-3 [facility] 11:09 AM 10/01/2024  [facility]

## 2024-10-13 ENCOUNTER — Other Ambulatory Visit (HOSPITAL_COMMUNITY): Payer: Self-pay

## 2024-10-13 ENCOUNTER — Other Ambulatory Visit: Payer: Self-pay

## 2024-10-13 ENCOUNTER — Encounter (HOSPITAL_COMMUNITY): Payer: Self-pay

## 2024-10-14 ENCOUNTER — Other Ambulatory Visit (HOSPITAL_COMMUNITY): Payer: Self-pay

## 2024-10-24 ENCOUNTER — Other Ambulatory Visit: Payer: Self-pay | Admitting: Physician Assistant

## 2024-10-24 ENCOUNTER — Other Ambulatory Visit (HOSPITAL_COMMUNITY): Payer: Self-pay

## 2024-10-24 MED ORDER — LINACLOTIDE 72 MCG PO CAPS
72.0000 ug | ORAL_CAPSULE | Freq: Every day | ORAL | 3 refills | Status: AC
  Filled 2024-10-24 – 2024-12-16 (×2): qty 90, 90d supply, fill #0

## 2024-11-03 ENCOUNTER — Other Ambulatory Visit (HOSPITAL_COMMUNITY): Payer: Self-pay

## 2024-11-05 ENCOUNTER — Other Ambulatory Visit (HOSPITAL_COMMUNITY): Payer: Self-pay

## 2024-11-05 ENCOUNTER — Ambulatory Visit: Payer: MEDICAID | Admitting: Family Medicine

## 2024-11-05 ENCOUNTER — Other Ambulatory Visit: Payer: Self-pay

## 2024-11-05 ENCOUNTER — Encounter: Payer: Self-pay | Admitting: Family Medicine

## 2024-11-05 ENCOUNTER — Ambulatory Visit: Payer: Self-pay | Admitting: Family Medicine

## 2024-11-05 VITALS — BP 116/74 | HR 99 | Ht 61.0 in | Wt 328.2 lb

## 2024-11-05 DIAGNOSIS — F339 Major depressive disorder, recurrent, unspecified: Secondary | ICD-10-CM

## 2024-11-05 DIAGNOSIS — J4530 Mild persistent asthma, uncomplicated: Secondary | ICD-10-CM

## 2024-11-05 DIAGNOSIS — R7303 Prediabetes: Secondary | ICD-10-CM | POA: Diagnosis not present

## 2024-11-05 DIAGNOSIS — F319 Bipolar disorder, unspecified: Secondary | ICD-10-CM | POA: Diagnosis not present

## 2024-11-05 LAB — POCT GLYCOSYLATED HEMOGLOBIN (HGB A1C): HbA1c, POC (prediabetic range): 6.5 % — AB (ref 5.7–6.4)

## 2024-11-05 MED ORDER — METFORMIN HCL 500 MG PO TABS
500.0000 mg | ORAL_TABLET | Freq: Two times a day (BID) | ORAL | 3 refills | Status: AC
  Filled 2024-11-05: qty 180, 90d supply, fill #0

## 2024-11-05 MED ORDER — BUDESONIDE-FORMOTEROL FUMARATE 160-4.5 MCG/ACT IN AERO
2.0000 | INHALATION_SPRAY | Freq: Two times a day (BID) | RESPIRATORY_TRACT | 3 refills | Status: AC
  Filled 2024-11-05: qty 10.2, 30d supply, fill #0

## 2024-11-05 NOTE — Assessment & Plan Note (Addendum)
 PHQ with thoughts of self harm.  Patient was previously prescribed Abilify  5 mg daily, but has not been taking this regularly.  Advised that she should take this in order to help with her Bipolar, Tourette's, and Depression.  Provided patient the option to going to inpatient psychiatry today, but she declined as she has no active SI, but has continuous thoughts of harming herself regularly over many years.  Triggers include her family situation primarily.  Describes having very limited outlets outside of her home due to restrictions with transportation and social support.  After assessing the risk of her thoughts and not having any active attempts recently, felt safe for the patient to go home and she agreed. - Abilify  5 mg daily - Referral to psychiatry and resources provided in AVS - Follow-up in 1 week - Discussed that if she has any active thoughts she should call the suicide hotline, tell someone, go to the ED, or [facility] immediately

## 2024-11-05 NOTE — Progress Notes (Cosign Needed Addendum)
 SUBJECTIVE:   CHIEF COMPLAINT / HPI:   Sherri [name] is a 23 y.o. female presenting for anxiety/depresion found to have positive passive SI.  - PHQ 20 - History of self harm, hard plastic boxcutter, thinks about using it now, but hasn't used it since 2023  Risk Factors:  Other mental health disorders: Bipolar  On/Off Prescribed medications: Clonazepam  as needed, not taking Abilify  currently Prior Attempts: last time in 2023, everyday at that time Family History: sister Access to Lethal Means: no Social Isolation: yes Co-Morbid Health Conditions: yes Delusions/Hallucinations (command hallucinations): hallucinations (command yes) Hx of Substance abuse: no Decreased/Increased Appetite: increased Decreased/Increased Sleep: decreased  Protective Factors:  Family Support: no Engaged in Outpatient Treatment: no, has had multiple therapist, but haven't worked out, and she missed multiple appointments Cultural/Religious Connections: no No access to lethal means: no  Overall Risk: chronic suicidal mode   Assess Ideation:  In the past 2 days, week, months have you had thoughts of killing yourself: no, but has thoughts of self harm How often do thoughts occur: every couple days When do they have thoughts, are they interfering with life: yes In the past few weeks, have you wished you were dead: no In the past few weeks have you felt that you or your family would be better off if you were dead: no, but sometimes has passing thoughts that she may be better off if she were dead Are you have thoughts of killing yourself right now: no  Assess Plan:  Do you have a plan to kill yourself: no If you were going to kill yourself, how would you do it: has had thoughts in the past in 2022-2023 to take as much medicine as she could, but none now  Assess Behaviors:  Have you ever tried to hurt yourself and how did you do it: plastic box-cutter Nature/outcome of past attempts: minimal  bleeding/pain Self-Injurious Behaviors: cutting  Intent:  What are some reasons you would NOT kill your yourself: family, main financial support (on disability d/t mental health), life won't always be like this What are some reasons you WOULD kill yourself: things don't go how you want them to all the time and she feels stuck, hurting herself makes it feel like a get away from feeling stuck Do you expect to carry out your plan: no Do you believe your actions will be lethal: no  Previous Meds: Duloxetine  (didn't work), Lexapro  (no feeling/numb), Prozac  (panic attacks), Zoloft  (liked the best), and Amitriptyline  (didn't help)  PERTINENT  PMH / PSH: reviewed and updated.  OBJECTIVE:   BP 116/74   Pulse (!) 109   Ht 5' 1 (1.549 m)   Wt (!) 328 lb 3.2 oz (148.9 kg)   SpO2 97%   BMI 62.01 kg/m   Well-appearing, no acute distress Cardio: Regular rate, regular rhythm, no murmurs on exam. Pulm: Clear, no wheezing, no crackles. No increased work of breathing Neuro: alert and oriented x3, speech normal in content Psych:  Cognition and judgment appear intact. Alert, communicative  and cooperative with normal attention span and concentration. No apparent delusions, illusions, hallucinations during visit  ASSESSMENT/PLAN:   Assessment & Plan Bipolar 1 disorder (HCC) Depression, recurrent PHQ with thoughts of self harm.  Patient was previously prescribed Abilify  5 mg daily, but has not been taking this regularly.  Advised that she should take this in order to help with her Bipolar, Tourette's, and Depression.  Provided patient the option to going to inpatient psychiatry today,  but she declined as she has no active SI, but has continuous thoughts of harming herself regularly over many years.  Triggers include her family situation primarily.  Describes having very limited outlets outside of her home due to restrictions with transportation and social support.  After assessing the risk of her thoughts  and not having any active attempts recently, felt safe for the patient to go home and she agreed. - Abilify  5 mg daily - Referral to psychiatry and resources provided in AVS - Follow-up in 1 week - Discussed that if she has any active thoughts she should call the suicide hotline, tell someone, go to the ED, or [facility] immediately Mild persistent chronic asthma without complication Refilled Symbicort . Prediabetes A1c 6.5 today, increased from 5.8 previously.  Currently taking metformin  500 mg daily instead of twice daily as prescribed. - Advised patient to take metformin  500 mg BID - Advised lifestyle and dietary modifications   Safety Planning (Provided in AVS)  Identifying Signs and Triggers:  Things that make thoughts worse: sister (mean, cold-hearted, no sympathy) How to avoid situations: difficult to avoid because they share a room  Coping Strategies:  Things that help patient feel better: getting out of the house Goals for implementation: difficult with limited income, walks  Distractions:  Identify people that can help distract from thoughts: no because triggers are the family Identify social situations or places that help distract: unable to get transport places  People to ask for help:  List: none  Reasons to keep living:  - Family - Financial support - Things you want to achieve and do better  Therapy Resources:  Please go to:  [facility] [address]  [place], [place] [address] [phone] Urgent psychiatry (medication management) Monday-Thursday 8-11AM.   It is highly recommended that you show up at 7/730 because it is first come first serve. For urgent therapy (not medication) Walk in hours are 8-1pm Monday through Wednesday (please come at 7/730 to ensure you are seen)   [name], DO [facility]

## 2024-11-05 NOTE — Patient Instructions (Addendum)
 It was great to see you today! Thank you for choosing [facility] for your primary care. Sherri [name] was seen for anxiety/depression.  Today we addressed: Medication refills Take your Abilify  as prescribed to see if this helps your depression symptoms as well. Referral made to psychiatry Follow up in 1 week to discuss how you are feeling  We are checking some labs today. I will send you a MyChart message with your results, per your preference. If you do not hear about your labs in the next 2 weeks, please call the office.   You should return to our clinic Return in about 1 week (around 11/12/2024). Please arrive 15 minutes before your appointment to ensure smooth check in process.  We appreciate your efforts in making this happen.  Thank you for allowing me to participate in your care, [name], DO 11/05/2024, 10:58 AM PGY-2, [facility]  Safety Planning  Identifying Signs and Triggers:  Things that make thoughts worse: sister (mean, cold-hearted, no sympathy) How to avoid situations: difficult to avoid because they share a room  Coping Strategies:  Things that help patient feel better: getting out of the house Goals for implementation: difficult with limited income, walks  Distractions:  Identify people that can help distract from thoughts: no because triggers are the family Identify social situations or places that help distract: unable to get transport places  People to ask for help:  List: none  Reasons to keep living:  - Family - Financial support - Things you want to achieve and do better  Therapy Resources:  Please go to:  [facility] [address]  [place], [place] [address] [phone] Urgent psychiatry (medication management) Monday-Thursday 8-11AM.   It is highly recommended that you show up at 7/730 because it is first come first serve. For urgent therapy (not medication) Walk in hours are 8-1pm Monday  through Wednesday (please come at 7/730 to ensure you are seen)    Therapy and Counseling Resources Most providers on this list will take Medicaid. Patients with commercial insurance or Medicare should contact their insurance company to get a list of in network providers.  [facility] (takes children) Location 1: [address] [place], [place] [address] Location 2: [address], [place] [address] [phone]   [facility] (spanish speaking therapist available)(habla espanol)(take medicare and medicaid)  [address], [place], [place] [address], [place]  [email or web address] [phone]  [facility] [address] [place], [place] [address] [phone]  [facility]   [address] [place], [place] [address]      [phone]  [facility] (spanish available) [address]  [place], [place] [address] [phone]  [facility] (take [facility] and medicare) [address], [address]  [place], [place] [address]       [phone]     [facility] (virtual only) [phone]  [facility] [address] [place], [place] [phone]  [facility]:  [address] [place] [phone]  [facility]:  [address] [place] [phone]  [facility] (under & uninsured) [address]   [place] [phone]    [email or web address]    [facility] [address]  [place]    [phone]  [facility] [place] -[address]     Phone:  [phone]     [place]-  [address]  [phone]   [facility] [address]      [place], [place] [phone]  [facility]: [address] [place], [place], [place]  [phone]   [facility] (Spanish  therapist) [email or web address]  [address] [place]  [address]   [place] [address]    [phone]    [facility]   [address],  [place], [place]  [phone]   [facility]  [address] [place]  [phone]  [facility]  [address] [place], [place]        [phone]  Open Access/Walk In Clinic under & uninsured  [facility]  [address] [place], [place] [phone] Crisis [phone]  [facility] [address],  (Spanish)   [address] , [place]: [phone]) 8:30 - 12; 1 - 2:30  [facility] [employer],  [address] [place], [place]    [phone]):8:30 - 12; 2 - 3PM  [facility] [employer],  [address],  [place]; [phone]):   Mon - Fri 8 AM - 5 PM  [facility] [address] [place]  MWF 12:30 to 3:00 or call to schedule an appointment  [phone]  Specific Provider options Psychology Today  [email or web address]/us  click on find a therapist  enter your zip code left side and select or tailor a therapist for your specific need.   [facility] Provider Directory [email or web address]  (Medicaid)   Follow all drop down to find a provider  Social Support program [facility] [phone] or photosolver.pl [address], [place], [place] Recovery support and educational   24- Hour Availability:   [facility]  [address] [place], [place] [phone] Crisis [phone]  [facility] [phone]  [place] [facility]  [phone]   [facility]  [phone] (after hours)  Therapeutic Alternative/Mobile Crisis   [phone]  [place]  National Suicide Hotline  828 750 5746 MERRILYN)  Call 911 or go to emergency room  [facility]  [phone]);  [place]  [phone]); [place], [place], [place], [place], [place], [place], [place]

## 2024-11-05 NOTE — Assessment & Plan Note (Signed)
 Refilled Symbicort
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
## 2024-12-03 NOTE — Assessment & Plan Note (Signed)
 Stable, currently prescribed Ability 5 mg daily, however not taking this currently right now. She feels like her mood has improved since her last visit, she feels like she has purpose, and doesn't feel like its helping her Tourette's. Shares that the Abilify  makes her sleepy.  - Advised the importance of Abilify  and its multifactorial affects on her diagnoses - Discussed trying Abilify  at night to help with sleep as well - Could consider Seroquel in the future instead

## 2024-12-05 ENCOUNTER — Ambulatory Visit: Payer: MEDICAID | Admitting: Family Medicine

## 2024-12-05 ENCOUNTER — Ambulatory Visit: Payer: Self-pay | Admitting: Family Medicine

## 2024-12-05 VITALS — BP 118/79 | HR 90 | Ht 61.0 in | Wt 325.8 lb

## 2024-12-05 DIAGNOSIS — F319 Bipolar disorder, unspecified: Secondary | ICD-10-CM | POA: Diagnosis not present

## 2024-12-05 DIAGNOSIS — Z0000 Encounter for general adult medical examination without abnormal findings: Secondary | ICD-10-CM

## 2024-12-05 DIAGNOSIS — Z01810 Encounter for preprocedural cardiovascular examination: Secondary | ICD-10-CM

## 2024-12-05 DIAGNOSIS — Z23 Encounter for immunization: Secondary | ICD-10-CM

## 2024-12-05 NOTE — Patient Instructions (Signed)
 It was great to see you today! Thank you for choosing [facility] for your primary care. Sherri [name] was seen for physical pre-operative clearance.  Today we addressed: You are doing well overall!  Please continue taking your medications as prescribed.  I would recommend you take the Abilify  5 mg at night if it helps make you sleepy as this could help you sleep well but also help your other conditions. We performed an EKG today for your preoperative visit.  We had a CBC and a CMP done in August 2025, please check with your bariatric surgeon if this will qualify for your labs.  If you need additional labs I am happy to set up a lab visit for you with future labs pended to you can come back to do those. SCAT form was filled today  You should return to our clinic No follow-ups on file. Please arrive 15 minutes before your appointment to ensure smooth check in process.  We appreciate your efforts in making this happen.  Thank you for allowing me to participate in your care, [name], DO 12/05/2024, 2:19 PM PGY-2, [facility]

## 2024-12-08 ENCOUNTER — Encounter: Payer: Self-pay | Admitting: Family Medicine

## 2024-12-12 ENCOUNTER — Encounter: Payer: Self-pay | Admitting: Family Medicine

## 2024-12-16 ENCOUNTER — Other Ambulatory Visit: Payer: Self-pay | Admitting: Family Medicine

## 2024-12-16 ENCOUNTER — Other Ambulatory Visit: Payer: Self-pay

## 2024-12-16 MED ORDER — FAMOTIDINE 20 MG PO TABS
20.0000 mg | ORAL_TABLET | Freq: Two times a day (BID) | ORAL | 0 refills | Status: AC | PRN
  Filled 2024-12-16: qty 30, 15d supply, fill #0

## 2024-12-17 ENCOUNTER — Other Ambulatory Visit (HOSPITAL_COMMUNITY): Payer: Self-pay

## 2025-01-02 ENCOUNTER — Ambulatory Visit: Payer: Self-pay | Admitting: Family Medicine

## 2025-01-06 ENCOUNTER — Ambulatory Visit: Payer: MEDICAID | Admitting: Family Medicine

## 2025-01-20 ENCOUNTER — Ambulatory Visit: Payer: MEDICAID | Admitting: Family Medicine
